# Patient Record
Sex: Female | Born: 1965
Health system: Southern US, Community
[De-identification: ages and names within clinical notes are randomized; demographics above are authoritative.]

## PROBLEM LIST (undated history)

## (undated) DIAGNOSIS — M21612 Bunion of left foot: Secondary | ICD-10-CM

## (undated) DIAGNOSIS — M722 Plantar fascial fibromatosis: Secondary | ICD-10-CM

## (undated) DIAGNOSIS — F329 Major depressive disorder, single episode, unspecified: Secondary | ICD-10-CM

## (undated) DIAGNOSIS — M5432 Sciatica, left side: Secondary | ICD-10-CM

## (undated) DIAGNOSIS — F4310 Post-traumatic stress disorder, unspecified: Secondary | ICD-10-CM

## (undated) DIAGNOSIS — E785 Hyperlipidemia, unspecified: Secondary | ICD-10-CM

## (undated) DIAGNOSIS — M21611 Bunion of right foot: Secondary | ICD-10-CM

## (undated) DIAGNOSIS — F32A Depression, unspecified: Secondary | ICD-10-CM

## (undated) DIAGNOSIS — R918 Other nonspecific abnormal finding of lung field: Secondary | ICD-10-CM

## (undated) HISTORY — PX: OTHER SURGICAL HISTORY: SHX169

---

## 2014-09-18 ENCOUNTER — Encounter (HOSPITAL_COMMUNITY): Payer: Self-pay | Admitting: Emergency Medicine

## 2014-09-18 ENCOUNTER — Emergency Department (HOSPITAL_COMMUNITY)
Admission: EM | Admit: 2014-09-18 | Discharge: 2014-09-18 | Disposition: A | Discharge status: Home / Self Care | Payer: Self-pay | Attending: Emergency Medicine | Admitting: Emergency Medicine

## 2014-09-18 DIAGNOSIS — L237 Allergic contact dermatitis due to plants, except food: Secondary | ICD-10-CM | POA: Insufficient documentation

## 2014-09-18 MED ORDER — PREDNISONE 20 MG PO TABS
60.0000 mg | ORAL_TABLET | Freq: Once | ORAL | Status: AC
  Administered 2014-09-18: 60 mg via ORAL
  Filled 2014-09-18: qty 3

## 2014-09-18 MED ORDER — PREDNISONE 20 MG PO TABS: 40.0000 mg | ORAL_TABLET | Freq: Every day | ORAL | Status: DC

## 2014-09-18 MED ORDER — DIPHENHYDRAMINE HCL 25 MG PO CAPS
25.0000 mg | ORAL_CAPSULE | Freq: Once | ORAL | Status: AC
  Administered 2014-09-18: 25 mg via ORAL
  Filled 2014-09-18: qty 1

## 2014-09-18 NOTE — Discharge Instructions (Signed)
Continue using benadryl to help with itching.  May also wish to use topical caladryl or calamine lotion to help try up vesicles. Return here as needed for new concerns.

## 2014-09-18 NOTE — ED Notes (Signed)
Pt. reports itchy skin rashes at lower legs , right hand and chin onset this week . Respirations unlabored / denies fever .

## 2014-09-18 NOTE — ED Provider Notes (Signed)
CSN: [id number]     Arrival date & time 09/18/14  0031 History   First MD Initiated Contact with Patient 09/18/14 0044     Chief Complaint  Patient presents with  . Rash     (Consider location/radiation/quality/duration/timing/severity/associated sxs/prior Treatment) Patient is a 49 y.o. female presenting with rash. The history is provided by the patient and medical records.  Rash   This is a 49 year old female with no significant past medical history presenting to the ED for further rash for her lower legs, right hand, and chin which began yesterday after mowing the grass.  She states rash is extremely pruritic.  Denies fever, chills.  No new soaps, detergents, or other personal care products.  Patient used OTC steroid cream without relief.  History reviewed. No pertinent past medical history. History reviewed. No pertinent past surgical history. No family history on file. History  Substance Use Topics  . Smoking status: Never Smoker   . Smokeless tobacco: Not on file  . Alcohol Use: No   OB History    No data available     Review of Systems  Skin: Positive for rash.  All other systems reviewed and are negative.     Allergies  Review of patient's allergies indicates no known allergies.  Home Medications   Prior to Admission medications   Not on File   BP 128/71 mmHg  Pulse 61  Temp(Src) 97.6 F (36.4 C) (Oral)  Resp 16  SpO2 98%  LMP 08/04/2014   Physical Exam  Constitutional: She is oriented to person, place, and time. She appears well-developed and well-nourished.  HENT:  Head: Normocephalic and atraumatic.  Mouth/Throat: Oropharynx is clear and moist.  No oral lesions; no oral swelling  Eyes: Conjunctivae and EOM are normal. Pupils are equal, round, and reactive to light.  Neck: Normal range of motion.  Cardiovascular: Normal rate, regular rhythm and normal heart sounds.   Pulmonary/Chest: Effort normal and breath sounds normal.  Abdominal: Soft. Bowel  sounds are normal.  Musculoskeletal: Normal range of motion.  Neurological: She is alert and oriented to person, place, and time.  Skin: Skin is warm and dry. Rash noted. Rash is vesicular.  Pruritic, vesicular rash of bilateral ankles occuring in chains; some areas of excoriation without signs of superimposed infection; no lesions on palms/soles  Psychiatric: She has a normal mood and affect.  Nursing note and vitals reviewed.   ED Course  Procedures (including critical care time) Labs Review Labs Reviewed - No data to display  Imaging Review No results found.   EKG Interpretation None      MDM   Final diagnoses:  Poison ivy   Rash consistent with poison ivy.  No signs of superimposed infection or cellulitis.  No oral lesions or oral swelling.  Patient afebrile and non-toxic in appearance.  Will start on steroid taper and benadryl, first dose given here.  Also recommended to use OTC caladryl and/or calamine lotion to help dry out vesicles.  Discussed plan with patient, he/she acknowledged understanding and agreed with plan of care.  Return precautions given for new or worsening symptoms.  Larene Pickett, PA-C 09/18/14 6256  Everlene Balls, MD 09/18/14 [phone]

## 2015-01-26 ENCOUNTER — Encounter (HOSPITAL_COMMUNITY): Payer: Self-pay | Admitting: Cardiology

## 2015-01-26 ENCOUNTER — Emergency Department (HOSPITAL_COMMUNITY)
Admission: EM | Admit: 2015-01-26 | Discharge: 2015-01-26 | Disposition: A | Discharge status: Home / Self Care | Payer: Self-pay | Attending: Emergency Medicine | Admitting: Emergency Medicine

## 2015-01-26 ENCOUNTER — Emergency Department (HOSPITAL_COMMUNITY): Payer: Self-pay

## 2015-01-26 DIAGNOSIS — M5441 Lumbago with sciatica, right side: Secondary | ICD-10-CM | POA: Insufficient documentation

## 2015-01-26 DIAGNOSIS — R51 Headache: Secondary | ICD-10-CM | POA: Insufficient documentation

## 2015-01-26 DIAGNOSIS — R2243 Localized swelling, mass and lump, lower limb, bilateral: Secondary | ICD-10-CM | POA: Insufficient documentation

## 2015-01-26 MED ORDER — NAPROXEN 500 MG PO TABS
500.0000 mg | ORAL_TABLET | Freq: Two times a day (BID) | ORAL | Status: DC
Start: 2015-01-26 — End: 2016-11-09

## 2015-01-26 MED ORDER — HYDROMORPHONE HCL 1 MG/ML IJ SOLN
1.0000 mg | Freq: Once | INTRAMUSCULAR | Status: AC
  Administered 2015-01-26: 1 mg via INTRAMUSCULAR
  Filled 2015-01-26: qty 1

## 2015-01-26 MED ORDER — PREDNISONE 50 MG PO TABS: ORAL_TABLET | ORAL | Status: DC

## 2015-01-26 MED ORDER — OXYCODONE-ACETAMINOPHEN 5-325 MG PO TABS: 1.0000 | ORAL_TABLET | Freq: Four times a day (QID) | ORAL | Status: DC | PRN

## 2015-01-26 NOTE — ED Notes (Signed)
Pt reports bilateral leg swelling and headache. Reports the pain in her legs radiates up into her hips.

## 2015-01-26 NOTE — Discharge Instructions (Signed)
Prescription for pain medicine, prednisone, anti-inflammatory medicine.  You will need to find a primary care doctor. Resource guide given.    Emergency Department Resource Guide 1) Find a Doctor and Pay Out of Pocket Although you won't have to find out who is covered by your insurance plan, it is a good idea to ask around and get recommendations. You will then need to call the office and see if the doctor you have chosen will accept you as a new patient and what types of options they offer for patients who are self-pay. Some doctors offer discounts or will set up payment plans for their patients who do not have insurance, but you will need to ask so you aren't surprised when you get to your appointment.  2) Contact Your Local Health Department Not all health departments have doctors that can see patients for sick visits, but many do, so it is worth a call to see if yours does. If you don't know where your local health department is, you can check in your phone book. The CDC also has a tool to help you locate your state's health department, and many state websites also have listings of all of their local health departments.  3) Find a [facility] If your illness is not likely to be very severe or complicated, you may want to try a walk in clinic. These are popping up all over the country in pharmacies, drugstores, and shopping centers. They're usually staffed by nurse practitioners or physician assistants that have been trained to treat common illnesses and complaints. They're usually fairly quick and inexpensive. However, if you have serious medical issues or chronic medical problems, these are probably not your best option.  No Primary Care Doctor: - Call Health Connect at  [phone] - they can help you locate a primary care doctor that  accepts your insurance, provides certain services, etc. - Physician Referral Service- [phone]  Chronic Pain Problems: Organization          Address  Phone   Notes  [facility]  [phone] Patients need to be referred by their primary care doctor.   Medication Assistance: Organization         Address  Phone   Notes  [facility] Med[facility] [address] [place], [place] [address] [phone] --Must be a resident of [facility] -- Must have NO insurance coverage whatsoever (no Medicaid/ Medicare, etc.) -- The pt. MUST have a primary care doctor that directs their care regularly and follows them in the community   MedAssist  [phone]   [employer]  [phone]    Agencies that provide inexpensive medical care: Organization         Address  Phone   Notes  [place]  [phone]   [facility]    [phone]   [facility] [place], [place] [address] [phone]   [place] [address], [place] [phone]   Planned Parenthood    [phone]   [facility]    [phone]   [place] [address], [place] Phone:  [phone], Fax:  [phone] Hours of Operation:  9 am - 6 pm, M-F.  Also accepts Medicaid/Medicare and self-pay.  [facility] for [address], [place] Phone: [phone], Fax: [phone]. Hours of Operation:  8:30 am - 5:30 pm, M-F.  Also accepts Medicaid and self-pay.  [facility] [place] [address], [place] Phone: [phone]   [place], [place], [place] [phone], Ext. [phone] Mondays & Thursdays: 7-9 AM.  First 15 patients are seen on a first come, first serve basis.    [place] Providers:  Organization         Address  Phone   Notes  [facility] [address], [place] [phone] Also accepts self-pay patients.  [facility] [address], [place]  [phone]   [place], [address], [place] [phone]   [facility] [address] [place] [phone]   Lucianne Lei [address], [place]   [phone] Only accepts [place] Access [place] patients after they have their name applied to their card.   Self-Pay (no insurance) in [facility]:  Organization         Address  Phone   Notes  Sickle Cell Patients, [facility] [place] [phone]   [facility] [place] [phone]   [facility] [place]  [place], [place], [place] [phone]   [facility]/Dr. Osei-Bonsu  [address] [place] or [address], [place] [phone] Phone number for both [place] and [place] locations is the same.  [facility] [address], [place] [phone]   [facility] [address] [place] or [address] [phone]   [facility] [address] [place] [phone], phone; [phone], fax Sees patients 1st and 3rd Saturday of every month.  Must not qualify for public or private insurance (i.e. Medicaid, Medicare, [place] Health Choice, Veterans' Benefits)  Household income should be no more than 200% of the poverty level The clinic cannot treat you if you are pregnant or think you are pregnant  Sexually transmitted diseases are not treated at the clinic.    Dental Care: Organization         Address  Phone  Notes  [facility] Department of [facility] [place] [phone] Accepts children up to age 37 who are enrolled in [place] or [place]; pregnant women with a Medicaid card; and  children who have applied for Medicaid or [place] Health Choice, but were declined, whose parents can pay a reduced fee at time of service.  [facility] Department of [facility]  [address], [place] [phone] Accepts children up to age 49 who are enrolled in [place] or [place]; pregnant women with a Medicaid card; and children who have applied for Medicaid or [place] Health Choice, but were declined, whose parents can pay a reduced fee at time of service.  [place] Adult [facility] Access PROGRAM  [place] [phone] Patients are seen by appointment only. Walk-ins are not accepted. [place] will see patients 12 years of age and older. Monday - Tuesday (8am-5pm) Most Wednesdays (8:30-5pm) $30 per visit, cash only  [place] Adult [employer] PROGRAM  [address], [employer] [phone])  [phone] Patients are seen by appointment only. Walk-ins are not accepted. [place] will see patients 47 years of age and older. One Wednesday Evening (Monthly: Volunteer Based).  $30 per visit, cash only  [place]  [phone] for adults; Children under age 54, call Graduate Pediatric Dentistry at [phone]. Children aged 55-14, please call [phone] to request a pediatric application.  Dental services are provided in all areas of dental care including fillings, crowns and bridges, complete and partial dentures, implants, gum treatment, root canals, and extractions. Preventive care is also provided. Treatment is provided to both adults and children. Patients are selected via a lottery and there is often a waiting list.   [facility] [address], [place]  [phone] [email or web address]   [facility] [address] [place], [place] [phone], Ext. [phone] Second and Fourth Thursday of each month, opens at 6:30 AM; Clinic ends at 9 AM.  Patients are seen on a first-come first-served  basis, and a limited number are seen during each clinic.   [facility]  [address] [place]ard Danker [place], [place] [phone]   Eligibility Requirements You must have lived in [place], [place], or [place] counties for at least the last three months.   You cannot be eligible for state or federal sponsored [employer], including [employer], [place], or [employer].   You generally cannot be eligible for healthcare insurance through your employer.    How to apply: Eligibility screenings are held every Tuesday and Wednesday afternoon from 1:00 pm until 4:00 pm. You do not need an appointment for the interview!  [facility] [address] [place], [place]   [place]  [place] Department  [place]  [phone]    Behavioral Health Resources in the Community: Intensive Outpatient Programs Organization         Address  Phone  Notes  Loraine Jefferson. [address], [place], [place] [phone]   [facility] Outpatient [address] [place], [place]   ADS: Alcohol & Drug Svcs [address] [place] [employer] [place], [place]   [place] [address]  [place], [place] or [phone]   Substance Abuse Resources Organization         Address  Phone  Notes  Alcohol and Drug Services  [phone]   [place]  [phone]   [facility] [place]   Chinita Pester  [phone]   Residential & Outpatient Substance Abuse Program  [phone]   Psychological Services Organization         Address  Phone  Notes  [facility] [place]  [place]  [phone]   [place] [address], [place] or [phone]    Mobile Crisis Teams Organization          Address  Phone  Notes  [facility]atives, Mobile Crisis Care Unit  [phone]   [facility]otherapeutic [facility]  [address] [place], [place]   [place] Levels [address] [place] [phone]    Self-Help/Support Groups Organization         Address  Phone             Notes  [place]. of [place] - variety of support groups  [phone]  [phone] Call for more information  Narcotics Anonymous (NA), [facility] [address], [employer] [place]  2 meetings at this location   Residential Facilities manager         Address  Phone  Notes  [facility]idential [facility] [place],    [place]  [phone]   [facility]  [address], [place] [phone], [place], [place]   [place] , [place] [phone] Admissions: 8am-3pm M-F  Incentives Substance [place] [address],    [place], [place] [phone]   [facility] [address] [place], [place], [place]   [facility] [address],  [place], [place]   [facility] Programs - Intensive Outpatient [address] Kristeen Mans [address], [place], [place]   [facility] [place].,  [place], [place] [phone] or [phone]   [facility] Services (RTS) [address], [place], [place] Accepts Medicaid  [facility] [place] [address]  [place] [phone] Substance Abuse/Addiction Treatment   [facility] Organization         Address  Phone  Notes  [facility]  [phone]   Domenic Schwab, PhD [address] Arlis Port[place], [place]   [phone] or [phone]   [facility] [place] JAARS [place], [place] [phone]   [facility] [address] [place], [place] [phone] Insurance/Medicaid/sponsorship  through [facility] [address], [address] [place]                                    [place], [place] [phone] [address], [place] [phone]    Dr. Adele Schilder  [phone]   [facility] Dept. 1) [address] [place] 2) [place] 3)  [place] [address], [place] [phone]  [phone]   [place] [phone] or [phone] (After Hours)

## 2015-01-26 NOTE — ED Provider Notes (Signed)
CSN: [id number]     Arrival date & time 01/26/15  1551 History   First MD Initiated Contact with Patient 01/26/15 1721     Chief Complaint  Patient presents with  . Leg Swelling  . Back Pain  . Headache     (Consider location/radiation/quality/duration/timing/severity/associated sxs/prior Treatment) HPI.....language line used for interpretation. Patient complains of low back pain with radiation to both legs, right greater than left. Symptoms have been apparent for some time. No trauma. No bowel or bladder incontinence. Severity of pain is mild to moderate. She is taking unknown medications at home.  History reviewed. No pertinent past medical history. History reviewed. No pertinent past surgical history. History reviewed. No pertinent family history. Social History  Substance Use Topics  . Smoking status: Never Smoker   . Smokeless tobacco: None  . Alcohol Use: No   OB History    No data available     Review of Systems  All other systems reviewed and are negative.     Allergies  Review of patient's allergies indicates no known allergies.  Home Medications   Prior to Admission medications   Medication Sig Start Date End Date Taking? Authorizing Provider  naproxen (NAPROSYN) 500 MG tablet Take 1 tablet (500 mg total) by mouth 2 (two) times daily with a meal. As needed for pain 01/26/15   Nat Christen, MD  oxyCODONE-acetaminophen (PERCOCET/ROXICET) 5-325 MG per tablet Take 1-2 tablets by mouth every 6 (six) hours as needed. 01/26/15   Nat Christen, MD  predniSONE (DELTASONE) 50 MG tablet One tab for 6 days, one half tab for 6 days 01/26/15   Nat Christen, MD   BP 105/72 mmHg  Pulse 68  Temp(Src) 98.3 F (36.8 C) (Oral)  Resp 14  Ht 5\' 4"  (1.626 m)  Wt 184 lb 9.6 oz (83.734 kg)  BMI 31.67 kg/m2  SpO2 96%  LMP 01/05/2015 Physical Exam  Constitutional: She is oriented to person, place, and time. She appears well-developed and well-nourished.  Patient is ambulatory without  deficits.  HENT:  Head: Normocephalic and atraumatic.  Eyes: Conjunctivae and EOM are normal. Pupils are equal, round, and reactive to light.  Neck: Normal range of motion. Neck supple.  Cardiovascular: Normal rate and regular rhythm.   Pulmonary/Chest: Effort normal and breath sounds normal.  Abdominal: Soft. Bowel sounds are normal.  Musculoskeletal: Normal range of motion.  Minimal lower back tenderness to palpation. Minimal pain with straight leg raise right greater than left.  Neurological: She is alert and oriented to person, place, and time.  Skin: Skin is warm and dry.  Psychiatric: She has a normal mood and affect. Her behavior is normal.  Nursing note and vitals reviewed.   ED Course  Procedures (including critical care time) Labs Review Labs Reviewed - No data to display  Imaging Review Dg Lumbar Spine Complete  01/26/2015   CLINICAL DATA:  Initial encounter for three-week history of sharp low back pain radiating into both legs.  EXAM: LUMBAR SPINE - COMPLETE 4+ VIEW  COMPARISON:  None.  FINDINGS: There is no evidence of lumbar spine fracture. Alignment is normal. Intervertebral disc spaces are maintained.  IMPRESSION: Negative.   Electronically Signed   By: Misty Stanley M.D.   On: 01/26/2015 17:54   I have personally reviewed and evaluated these images and lab results as part of my medical decision-making.   EKG Interpretation None      MDM   Final diagnoses:  Midline low back pain with right-sided sciatica  Patient has no primary care relationship. Plain films of lumbar spine were negative. Pain appears to be sciatica in nature. She could've a herniated disc. Discharge medications Percocet, prednisone, Naprosyn 500 mg. Resource guide given for follow-up.  Nat Christen, MD 01/26/15 [phone]

## 2015-08-14 ENCOUNTER — Encounter (HOSPITAL_COMMUNITY): Payer: Self-pay | Admitting: Emergency Medicine

## 2015-08-14 ENCOUNTER — Emergency Department (HOSPITAL_COMMUNITY)
Admission: EM | Admit: 2015-08-14 | Discharge: 2015-08-14 | Disposition: A | Discharge status: Home / Self Care | Payer: Self-pay | Attending: Emergency Medicine | Admitting: Emergency Medicine

## 2015-08-14 DIAGNOSIS — M542 Cervicalgia: Secondary | ICD-10-CM | POA: Insufficient documentation

## 2015-08-14 MED ORDER — PREDNISONE 20 MG PO TABS: 20.0000 mg | ORAL_TABLET | Freq: Two times a day (BID) | ORAL | Status: DC

## 2015-08-14 MED ORDER — DIAZEPAM 5 MG PO TABS: 5.0000 mg | ORAL_TABLET | Freq: Four times a day (QID) | ORAL | Status: DC | PRN

## 2015-08-14 NOTE — Discharge Instructions (Signed)
Use heat on the sore area 3 or 4 times a day. Do not work when you're taking the diazepam.   Company secretary (Musculoskeletal Pain) El dolor musculoesqueltico se siente en huesos y msculos. El dolor puede ocurrir en cualquier parte del cuerpo. El profesional que lo asiste podr tratarlo sin Pharmacist, community causa del dolor. Lo tratar [employer] de laboratorio (sangre y [place]), las radiografas y [place]. La causa de estos dolores puede ser un virus.  CAUSAS Generalmente no existe una causa definida para este trastorno. Tambin el [employer] [place] [employer] puede deberse a la [place]. En la actividad excesiva se incluye el hacer ejercicios fsicos muy intensos cuando no se est en buena forma. El dolor de huesos tambin puede deberse a cambios climticos. Los huesos son sensibles a los cambios en la presin atmosfrica. [place]  Para proteger su privacidad, no se entregarn los [employer] pruebas por telfono. Asegrese de conseguirlos. Consulte el modo en que podr obtenerlos si no se lo han informado. Es su responsabilidad contar con los [employer].  Utilice los medicamentos de venta libre o de prescripcin para Conservation officer, historic buildings, Health and safety inspector o la [place], segn se lo indique el profesional que lo asiste. Si le han administrado medicamentos, no conduzca, no opere maquinarias ni Teacher, adult education, y tampoco firme documentos legales durante 24 horas. No beba alcohol. No tome pldoras para dormir ni otros medicamentos que Animal nutritionist.  Podr seguir con todas las actividades a menos que stas le ocasionen ms [employer]. Cuando el dolor disminuya, es importante que gradualmente reanude toda la rutina habitual. Retome las actividades comenzando lentamente. Aumente gradualmente la intensidad y la duracin de sus actividades o del ejercicio.  Durante los perodos de dolor intenso, el reposo en cama puede  ser beneficioso. Recustese o sintese en la posicin que le sea ms cmoda.  Coloque hielo sobre la zona afectada.  Ponga hielo en [place]ico[place] Ba.  Colquese una toalla entre la piel y la bolsa de hielo.  Aplique el hielo durante 10 a 20 minutos 3  4 veces por da.  Si el dolor empeora, o no desaparece puede ser [employer] repetir las pruebas o Optometrist nuevos exmenes. El profesional que lo asiste podr requerir investigar ms profundamente para Animator causa posible. SOLICITE ATENCIN MDICA DE INMEDIATO SI:  Siente que el dolor empeora y no se alivia con los medicamentos.  Siente dolor en el pecho asociado a falta de aire, sudoracin, nuseas o vmitos.  El dolor se localiza en el abdomen.  Comienza a sentir nuevos sntomas que parecen ser diferentes o que lo preocupan. ASEGRESE DE QUE:   Comprende las instrucciones para el alta mdica.  Controlar su enfermedad.  Solicitar atencin mdica de inmediato segn las indicaciones.   Esta informacin no tiene Marine scientist el consejo del mdico. Asegrese de hacerle al mdico cualquier pregunta que tenga.   Document Released: 01/26/2005 Document Revised: 07/11/2011 Elsevier Interactive Patient Education [employer].

## 2015-08-14 NOTE — ED Provider Notes (Signed)
CSN: [id number]     Arrival date & time 08/14/15  1915 History   First MD Initiated Contact with Patient 08/14/15 2151     Chief Complaint  Patient presents with  . Headache  . Neck Pain     (Consider location/radiation/quality/duration/timing/severity/associated sxs/prior Treatment) HPI   Lori Morse is a 50 y.o. female who presents for evaluation of left neck pain, for 5 days, without known trauma. 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 She has exacerbation of the pain with movement of the neck, rotating right and left lateral bending. She denies weakness, dizziness, paresthesias, nausea, vomiting or chest pain. There are no other known modifying factors.   History reviewed. No pertinent past medical history. History reviewed. No pertinent past surgical history. No family history on file. Social History  Substance Use Topics  . Smoking status: Never Smoker   . Smokeless tobacco: None  . Alcohol Use: No   OB History    No data available     Review of Systems  All other systems reviewed and are negative.     Allergies  Review of patient's allergies indicates no known allergies.  Home Medications   Prior to Admission medications   Medication Sig Start Date End Date Taking? Authorizing Provider  diazepam (VALIUM) 5 MG tablet Take 1 tablet (5 mg total) by mouth every 6 (six) hours as needed for anxiety (spasms). 08/14/15   Daleen Bo, MD  naproxen (NAPROSYN) 500 MG tablet Take 1 tablet (500 mg total) by mouth 2 (two) times daily with a meal. As needed for pain 01/26/15   Nat Christen, MD  oxyCODONE-acetaminophen (PERCOCET/ROXICET) 5-325 MG per tablet Take 1-2 tablets by mouth every 6 (six) hours as needed. 01/26/15   Nat Christen, MD  predniSONE (DELTASONE) 20 MG tablet Take 1 tablet (20 mg total) by mouth 2 (two) times daily. 08/14/15   Daleen Bo, MD   BP 120/71 mmHg  Pulse 58  Temp(Src) 98.6 F (37 C) (Oral)  Resp 16  Ht 5\' 2"   (1.575 m)  Wt 192 lb (87.091 kg)  BMI 35.11 kg/m2  SpO2 100%  LMP 07/23/2015 Physical Exam  Constitutional: She is oriented to person, place, and time. She appears well-developed and well-nourished.  HENT:  Head: Normocephalic and atraumatic.  Right Ear: External ear normal.  Left Ear: External ear normal.  Eyes: Conjunctivae and EOM are normal. Pupils are equal, round, and reactive to light.  Neck: Normal range of motion and phonation normal. Neck supple.  Cardiovascular: Normal rate, regular rhythm and normal heart sounds.   Pulmonary/Chest: Effort normal and breath sounds normal. She exhibits no bony tenderness.  Musculoskeletal: Normal range of motion.  Tenderness left lateral paravertebral musculature and sternocleidomastoid, to palpation. Mild limitation of range of motion of the left neck musculature.  Neurological: She is alert and oriented to person, place, and time. No cranial nerve deficit or sensory deficit. She exhibits normal muscle tone. Coordination normal.  Skin: Skin is warm, dry and intact.  Psychiatric: She has a normal mood and affect. Her behavior is normal. Judgment and thought content normal.  Nursing note and vitals reviewed.   ED Course  Procedures (including critical care time)   Findings discussed with patient, all questions answered.   Labs Review Labs Reviewed - No data to display  Imaging Review No results found. I have personally reviewed and evaluated these images and lab results as part of my medical decision-making.   EKG  Interpretation None      MDM   Final diagnoses:  Neck pain on left side    Nonspecific left neck pain, most likely musculoskeletal in origin. Doubt spinal myelopathy, serious bacterial infection or fracture.  Nursing Notes Reviewed/ Care Coordinated Applicable Imaging Reviewed Interpretation of Laboratory Data incorporated into ED treatment  The patient appears reasonably screened and/or stabilized for  discharge and I doubt any other medical condition or other [facility] requiring further screening, evaluation, or treatment in the ED at this time prior to discharge.  Plan: Home Medications- Prednisone, Valium; Home Treatments- heat, no work for 2 weeks; return here if the recommended treatment, does not improve the symptoms; Recommended follow up- PCP prn     Daleen Bo, MD 08/14/15 2318

## 2015-08-14 NOTE — ED Notes (Signed)
Pt. reports left neck pain radiating to left shoulder with headache onset Monday this week , denies injury , no fever or chills.

## 2015-08-14 NOTE — ED Notes (Signed)
Pt stable, ambulatory, states understanding of discharge instructions 

## 2016-10-27 ENCOUNTER — Encounter: Payer: Self-pay | Admitting: Pediatric Intensive Care

## 2016-11-04 NOTE — Congregational Nurse Program (Signed)
Congregational Nurse Program Note  Date of Encounter: 10/27/2016  Past Medical History: No past medical history on file.  Encounter Details:     CNP Questionnaire - 10/27/16 1500      Patient Demographics   Is this a new or existing patient? New   Patient is considered a/an Immigrant   Race Latino/Hispanic     Patient Assistance   Location of Patient Assistance [place]   Patient's financial/insurance status Self-Pay (Uninsured)   Uninsured Patient ([place] Card/Care Connects) Yes   Interventions Assisted patient in making appt.   Patient referred to apply for the following financial assistance Not Applicable   Food insecurities addressed Not Applicable   Transportation assistance No   Assistance securing medications No   Educational health offerings Navigating the healthcare system;Other     Encounter Details   Primary purpose of visit Education/Health Concerns   Was an Emergency Department visit averted? Not Applicable   Does patient have a medical provider? No   Patient referred to Establish PCP   Was a mental health screening completed? (GAINS tool) No   Does patient have dental issues? No   Does patient have vision issues? No   Does your patient have an abnormal blood pressure today? No   Since previous encounter, have you referred patient for abnormal blood pressure that resulted in a new diagnosis or medication change? No   Does your patient have an abnormal blood glucose today? No   Since previous encounter, have you referred patient for abnormal blood glucose that resulted in a new diagnosis or medication change? No   Was there a life-saving intervention made? No     Via interpreter [place]- client reports history of foot pain- specifically on arches. Foot pain is worse in morning. Client is on her feet frequently for work and for extended periods of time. CN demonstrated exercises for stretching feet and suggests rolling feet on frozen water bottled or tennis ball  for pain relief. Client would also liek PCP appointment for general checkup. CN assisted appointment at [place].

## 2016-11-09 ENCOUNTER — Encounter (INDEPENDENT_AMBULATORY_CARE_PROVIDER_SITE_OTHER): Payer: Self-pay | Admitting: Physician Assistant

## 2016-11-09 ENCOUNTER — Ambulatory Visit (INDEPENDENT_AMBULATORY_CARE_PROVIDER_SITE_OTHER): Payer: Self-pay | Admitting: Physician Assistant

## 2016-11-09 VITALS — BP 144/74 | HR 63 | Temp 98.0°F | Ht 64.0 in | Wt 189.4 lb

## 2016-11-09 DIAGNOSIS — Z23 Encounter for immunization: Secondary | ICD-10-CM

## 2016-11-09 DIAGNOSIS — M722 Plantar fascial fibromatosis: Secondary | ICD-10-CM

## 2016-11-09 MED ORDER — PLANTAR FASCIITIS ARCH SLEEVE MISC: 1.0000 "application " | Freq: Every day | 0 refills | Status: DC

## 2016-11-09 MED ORDER — NAPROXEN 500 MG PO TABS: 500.0000 mg | ORAL_TABLET | Freq: Two times a day (BID) | ORAL | 0 refills | Status: DC

## 2016-11-09 NOTE — Progress Notes (Signed)
  Subjective:  Patient ID: Lori Morse, female    DOB: 1965/06/30  Age: 51 y.o. MRN: [id number]  CC: right heel pain.  HPI [place] is a 51 y.o. female with no significant PMH presents with right heel pain. Onset of right heel pain 8 months ago. 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 Has taken Advil as recommended by a provider at [place]. Advil is mildly effective and only worked temporarily. Worse with walking on hard surfaces and when first standing from a supine or seated position. Feels like a "ball" in the heel. There is mild associated swelling in the right heel. Does not endorse any other symptoms or complaints.      ROS Review of Systems  Constitutional: Negative for chills, fever and malaise/fatigue.  Eyes: Negative for blurred vision.  Respiratory: Negative for shortness of breath.   Cardiovascular: Negative for chest pain and palpitations.  Gastrointestinal: Negative for abdominal pain and nausea.  Genitourinary: Negative for dysuria and hematuria.  Musculoskeletal: Negative for joint pain and myalgias.       Right heel pain  Skin: Negative for rash.  Neurological: Negative for tingling and headaches.  Psychiatric/Behavioral: Negative for depression. The patient is not nervous/anxious.     Objective:  There were no vitals taken for this visit.  BP/Weight 08/14/2015 01/26/2015 8/89/1694  Systolic BP 503 888 280  Diastolic BP 71 63 71  Wt. (Lbs) 192 184.6 -  BMI 35.11 31.67 -      Physical Exam  Constitutional: She is oriented to person, place, and time.  Well developed, overweight, NAD, polite  HENT:  Head: Normocephalic and atraumatic.  Eyes: No scleral icterus.  Cardiovascular: Normal rate, regular rhythm and normal heart sounds.   Pulmonary/Chest: Effort normal and breath sounds normal.  Musculoskeletal:  Right medial and central band of plantar fascia with mild TTP. Mild edema of the medial band noted.  Neurological: She is  alert and oriented to person, place, and time. No cranial nerve deficit. Coordination normal.  Normal gait  Skin: Skin is warm and dry. No rash noted. No erythema. No pallor.  Psychiatric: She has a normal mood and affect. Her behavior is normal. Thought content normal.  Vitals reviewed.    Assessment & Plan:   1. Plantar fasciitis of right foot - Begin Naproxen 500 mg BID x15 days - Advised to use Plantar Fasciitis foot sleep support, plantar fasciitis insoles, stretches, rolling massage of the sole, and to abstain from stair climber exercises until pain is gone.  2. Need for Tdap vaccination - administered Tdap in clinic today     Follow-up: Return in about 2 weeks (around 11/23/2016) for full physical.   Clent Demark PA

## 2016-11-09 NOTE — Patient Instructions (Signed)
Compre lo siguiente:  1. Plantar Fasciitis insoles 2. Plantar Fasciitis Foot Sleep support 3. Naproxen 500 mg  No se olvide de acer massages y Facilities manager.   Fascitis plantar (Plantar Fasciitis) La fascitis plantar es una afeccin dolorosa que se produce en el taln. Ocurre cuando la banda de tejido que [employer] dedos con el hueso del taln (fascia plantar) se irrita. Esto puede ocurrir despus de Financial planner ejercicio u otras actividades repetitivas (lesin por uso excesivo). El dolor de la fascitis plantar puede ser de leve (irritacin) a intenso, y en los casos ms agudos puede dificultar que la persona camine o se [place]. Por lo general, el dolor es peor a la maana o despus de Public affairs consultant sentado o acostado durante un perodo. CAUSAS Este trastorno puede ser causado por:  Estar de pie durante largos perodos.  Usar zapatos que no calcen bien.  Practicar actividades de alto impacto, como correr, o hacer ejercicios [employer] o ballet.  Tener sobrepeso.  Tener una forma de caminar (marcha) anormal.  Tener los msculos de la pantorrilla tensos.  Tener el arco alto en los pies.  Comenzar una nueva actividad fsica. SNTOMAS El sntoma principal de esta afeccin es el dolor en el taln. Otros sntomas pueden ser los siguientes:  Dolor que empeora despus de una actividad o un ejercicio.  Dolor ms intenso a la maana o despus de Production assistant, radio.  Dolor que desaparece despus de caminar durante unos minutos. DIAGNSTICO Esta afeccin se puede diagnosticar en funcin de los signos y los sntomas. El mdico Environmental education officer un examen fsico para controlar si tiene lo siguiente:  Un rea dolorida en la parte inferior del pie.  El arco alto.  Dolor al Agricultural consultant.  Dificultad para mover el pie. Tambin puede necesitar estudios por imgenes para confirmar el diagnstico. Estos pueden incluir los siguientes:  Radiografas.  Ecografa.  Resonancia  magntica. TRATAMIENTO El tratamiento de la fascitis plantar depende de la gravedad de la afeccin. El tratamiento puede incluir lo siguiente:  Reposo, hielo y analgsicos de venta libre para Financial controller.  Ejercicios para [place] pantorrillas y la fascia plantar.  Una frula que [employer] estirado y [place] mientras usted duerme (frula nocturna).  Fisioterapia para [employer] sntomas y Customer service manager en el futuro.  Inyecciones de cortisona para Best boy intenso.  Tratamiento con ondas de choque extracorpreas para estimular con impulsos elctricos la fascia plantar lesionada. Esto suele usarse como un ltimo recurso antes de la [place].  Ciruga, si los otros tratamientos no han funcionado despus de 93meses. [place] los medicamentos solamente como se lo haya indicado el mdico.  Evite las actividades que causan dolor.  Frote la parte inferior del pie sobre una bolsa de hielo o una botella de agua fra. Haga esto durante 64minutos, de 3a 4veces al [employer].  Realice estiramientos simples como se lo haya indicado el mdico.  Trate de usar calzado deportivo con amortiguacin de aire o gel, o plantillas blandas.  Si el mdico se lo ha indicado, use una frula nocturna para dormir.  Cumpla con todas las visitas de control, segn le indique su mdico. PREVENCIN  No realice ejercicios ni actividades que le causen dolor en el taln.  Considere la posibilidad de Art gallery manager actividades de bajo impacto si sigue teniendo problemas.  Pierda peso si lo necesita. La mejor forma de prevenir la fascitis plantar es evitar las actividades que lesionan ms  la fascia plantar. SOLICITE ATENCIN MDICA SI:  Los sntomas no desaparecen despus del tratamiento en su casa.  El dolor [place].  El Gaffer la capacidad de moverse o de Optometrist las actividades diarias. Esta informacin no tiene Marine scientist el  consejo del mdico. Asegrese de hacerle al mdico cualquier pregunta que tenga. Document Released: 01/26/2005 Document Revised: 08/10/2015 Document Reviewed: 02/26/2014 Elsevier Interactive Patient Education  2018 [employer].

## 2016-11-23 ENCOUNTER — Telehealth (INDEPENDENT_AMBULATORY_CARE_PROVIDER_SITE_OTHER): Payer: Self-pay | Admitting: Physician Assistant

## 2016-11-23 NOTE — Telephone Encounter (Signed)
Pt called stating both feet are swollen and not feeling better with exercises. Pt has been scheduled a f/up but would like to know if there is a different medication she can try. Please f/up

## 2016-11-24 NOTE — Telephone Encounter (Signed)
I have advised that she get plantar fasciitis insoles and sleep support. She should also be using these. In regards to medications, she should continue naproxen 500 mg BID for now until f/u.

## 2016-11-24 NOTE — Telephone Encounter (Signed)
FWD to PCP. Tempestt S Roberts, CMA  

## 2016-12-13 ENCOUNTER — Ambulatory Visit (INDEPENDENT_AMBULATORY_CARE_PROVIDER_SITE_OTHER): Payer: Self-pay | Admitting: Physician Assistant

## 2016-12-13 ENCOUNTER — Encounter (INDEPENDENT_AMBULATORY_CARE_PROVIDER_SITE_OTHER): Payer: Self-pay | Admitting: Physician Assistant

## 2016-12-13 VITALS — BP 110/66 | HR 58 | Temp 98.5°F | Resp 16 | Ht 60.0 in | Wt 189.2 lb

## 2016-12-13 DIAGNOSIS — M722 Plantar fascial fibromatosis: Secondary | ICD-10-CM

## 2016-12-13 DIAGNOSIS — M79671 Pain in right foot: Secondary | ICD-10-CM

## 2016-12-13 DIAGNOSIS — L723 Sebaceous cyst: Secondary | ICD-10-CM

## 2016-12-13 MED ORDER — IBUPROFEN 800 MG PO TABS: 800.0000 mg | ORAL_TABLET | Freq: Three times a day (TID) | ORAL | 0 refills | Status: DC | PRN

## 2016-12-13 MED ORDER — TRAMADOL HCL 50 MG PO TABS: 50.0000 mg | ORAL_TABLET | Freq: Four times a day (QID) | ORAL | 0 refills | Status: AC | PRN

## 2016-12-13 NOTE — Patient Instructions (Signed)
Fasciotoma plantar abierta (Open Plantar Fasciotomy) La fasciotoma plantar abierta es un procedimiento para cortar una franja de tejido grueso de la parte inferior del pie (fascia plantar) para aliviar la presin. La fascia plantar conecta el hueso del taln con la base de los dedos del pie. Si la fascia se hincha o se irrita (fascitis plantar), puede causar dolor en el taln. Puede que necesite una ciruga si tiene dolor en el taln por fascitis plantar y si otros tratamientos no han sido efectivos. INFORME A SU MDICO:  Cualquier alergia que tenga.  Todos los [employer], incluidos vitaminas, hierbas, gotas oftlmicas, cremas y medicamentos de venta libre.  Problemas previos que usted o los [employer] de su familia hayan tenido con el uso de anestsicos.  Enfermedades de la sangre que tenga.  Si tiene cirugas previas.  Cualquier enfermedad que tenga. RIESGOS Y COMPLICACIONES En general, se trata de un procedimiento seguro. Sin embargo, pueden ocurrir complicaciones, por ejemplo:  Hemorragia.  Infeccin.  Inestabilidad del pie.  Lesiones nerviosas.  Cicatrices dolorosas en el arco del pie.  Se le forma un cogulo sanguneo en una vena de la pierna (trombosis venosa profunda). A veces un cogulo se puede desprender y desplazarse hasta el pulmn (embolia pulmonar).  El procedimiento no tiene xito.  Reaccin alrgica a un medicamento. ANTES DEL PROCEDIMIENTO  Consulte al mdico si debe hacer o no lo siguiente: ? Cambiar o suspender los medicamentos que toma habitualmente. Esto es muy importante si toma medicamentos para la diabetes o anticoagulantes. ? Tomar medicamentos como aspirina e ibuprofeno. Estos medicamentos pueden tener un efecto anticoagulante en la [place]. No tome estos medicamentos antes del procedimiento si el mdico le indica que no lo haga.  Siga las indicaciones del mdico respecto de las restricciones para las comidas o las bebidas.  Haga  planes para que una persona lo lleve de vuelta a su casa despus del procedimiento.  Pregntele al mdico cmo se Scientist, clinical (histocompatibility and immunogenetics) o se Museum/gallery curator de la Leisure centre manager.  Pueden darle antibiticos para ayudar a prevenir las infecciones. PROCEDIMIENTO  Para reducir el riesgo de infecciones: ? El equipo mdico se lavar o se desinfectar las manos. ? Le lavarn la piel con jabn.  Le colocarn una va intravenosa (IV) en una de las venas.  Le administrarn uno o ms de los siguientes medicamentos: ? Un medicamento para ayudarlo a relajarse (sedante). ? Un medicamento para adormecer la zona (anestesia local). ? Un medicamento que lo har dormir (anestesia general).  El cirujano har un corte pequeo (incisin) en la planta del pie. Esta incisin se realiza generalmente en la zona interior de la planta del pie, justo en frente del taln.  El cirujano extender la incisin hacia abajo, a la fascia. Luego cortar aproximadamente la mitad de las capas de la fascia, la mayora de la parte interior.  La incisin se cerrar con puntos (suturas).  Colocarn una vendas (vendaje) sobre la parte inferior del pie. Este procedimiento puede variar segn el mdico y el hospital. DESPUS DEL PROCEDIMIENTO  Marin Comment controlarn con frecuencia la presin arterial, la frecuencia cardaca, la frecuencia respiratoria y Retail buyer de oxgeno en la sangre hasta que haya desaparecido el efecto de los medicamentos administrados.  Le darn medicamentos para el dolor si los necesita.  El mdico lo enviar a su casa con una frula, bota o zapato ortopdicos para usar [employer] se cure. Tambin podr indicarle el uso de muletas para ayudarle a caminar sin usar el pie afectado y  soportar el peso del cuerpo.  No conduzca durante 24horas si le administraron un sedante. Esta informacin no tiene Marine scientist el consejo del mdico. Asegrese de hacerle al mdico cualquier pregunta que tenga. Document Released:  03/30/2015 Document Revised: 03/30/2015 Document Reviewed: 10/27/2014 Elsevier Interactive Patient Education  2018 [place] (Epidermal Cyst) Un quiste epidrmico a veces se denomina quiste de inclusin epidrmica o quiste infundibular. Es un saco de tejido cutneo. El saco contiene una sustancia llamada [place]. La queratina es una protena que normalmente se secreta a travs de folculos capilares. Cuando la queratina queda atrapada en la capa superior de la piel (epidermis), puede formar un quiste epidrmico. Los quistes epidrmicos normalmente se encuentran en la cara, el cuello, el tronco o los genitales. Estos quistes por lo general no son dainos (son benignos), y es posible que no causen sntomas, salvo que se infecten. Es importante que no apriete los quistes epidrmicos. CAUSAS Esta afeccin puede ser causada por lo siguiente:  Un folculo capilar bloqueado.  Un pelo que se riza y vuelve a Dietitian en la piel en vez de crecer hacia afuera de la piel (pelo encarnado).  Un poro bloqueado.  Irritacin de la piel.  Una lesin en la piel.  Ciertas afecciones que se transmiten de padres a hijos (hereditarias).  Virus del papiloma humano (VPH). FACTORES DE RIESGO Los siguientes factores pueden hacer que usted sea ms propenso a desarrollar un quiste epidrmico:  Acn.  Tener sobrepeso.  Usar ropa [place]. SNTOMAS El nico sntoma de esta afeccin puede ser un pequeo bulto no doloroso debajo de la piel. Cuando un quiste epidrmico se infecta, pueden aparecer algunos sntomas, como los siguientes:  Enrojecimiento.  Inflamacin.  Dolor a Secretary/administrator.  [place].  Cristy Hilts.  Queratina que sale del [place]. La queratina puede verse como una sustancia blanca griscea con mal olor.  Pus que sale del quiste. DIAGNSTICO Esta afeccin se diagnostica mediante un examen fsico. En algunos casos, pueden tomarle una muestra de tejido (biopsia) del quiste para  examinarla con un microscopio o hacerle anlisis para ver si hay bacterias. Pueden derivarlo a un mdico especialista en el cuidado de la piel (dermatlogo). TRATAMIENTO En muchos casos, los quistes epidrmicos desaparecen por s solos, sin tratamiento. Si un quiste se infecta, el tratamiento puede incluir lo siguiente:  Harlon Ditty y Training and development officer. Despus del drenaje, se puede hacer una ciruga menor para [employer] restos del [place].  Antibiticos como ayuda para prevenir una infeccin.  Inyecciones de medicamentos (corticoides) que ayudan a Risk manager.  Ciruga para extirpar el quiste. Se puede realizar una ciruga si: ? El quiste se agranda. ? El [employer]. ? Existe la posibilidad de que el quiste se [place] en un tipo de cncer. [place] los medicamentos de venta libre y los recetados solamente como se lo haya indicado el mdico.  Si le recetaron un antibitico, tmelo como se lo haya indicado el mdico. No deje de usar el antibitico aunque comience a Sports administrator.  Mantenga el rea que rodea el quiste limpia y [place].  Use ropa suelta y seca.  No intente apretar el quiste.  Evite tocar el quiste.  Revise el [employer] para detectar signos de infeccin.  Concurra a todas las visitas de control como se lo haya indicado el mdico. Esto es importante. PREVENCIN  Use ropa limpia y [place].  Evite usar ropa [place].  Mantenga la piel limpia  y [place]. Tome una ducha o un bao [employer].  Lvese el cuerpo con perxido de benzolo cuando tome una ducha o un bao. SOLICITE ATENCIN MDICA SI:  El quiste desarrolla sntomas de infeccin.  El problema no mejora, o empeora.  Le aparece un quiste con un aspecto diferente de otros quistes que ha tenido.  Tiene fiebre. SOLICITE ATENCIN MDICA DE INMEDIATO SI:  Aparece enrojecimiento en el quiste que se propaga hacia el rea que lo rodea. Esta  informacin no tiene Marine scientist el consejo del mdico. Asegrese de hacerle al mdico cualquier pregunta que tenga. Document Released: 05/30/2006 Document Revised: 07/11/2011 Document Reviewed: 02/18/2015 Elsevier Interactive Patient Education  [employer].

## 2016-12-13 NOTE — Progress Notes (Signed)
Subjective:  Patient ID: Lori Morse, female    DOB: 1965/07/18  Age: 51 y.o. MRN: [id number]  CC: plantar fasciitis  HPI Lori Morse is a 51 y.o. female with no significant PMH presents with right heel pain. Onset of right heel pain 9 months ago. 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 Has taken Advil as recommended by a provider at [facility]. Advil is mildly effective and only worked temporarily. Worse with walking on hard surfaces and when first standing from a supine or seated position. Feels like a "ball" in the heel. There is mild associated swelling in the right heel. She has been out of work for one month due to heel pain. Naproxen, massage, stretches, and plantar fasciitis insole has failed.      Pt also complains of a small lesion over the left upper lip that has progressively grown over the past few weeks. She tried squeezing the lesion but only blood would come out. Somewhat tender to palpation. No surrounding tenderness, erythema or edema.     Outpatient Medications Prior to Visit  Medication Sig Dispense Refill  . Elastic Bandages & Supports (PLANTAR FASCIITIS ARCH SLEEVE) MISC 1 application by Does not apply route daily. 1 each 0  . naproxen (NAPROSYN) 500 MG tablet Take 1 tablet (500 mg total) by mouth 2 (two) times daily with a meal. 30 tablet 0   No facility-administered medications prior to visit.      ROS Review of Systems  Constitutional: Negative for chills, fever and malaise/fatigue.  Eyes: Negative for blurred vision.  Respiratory: Negative for shortness of breath.   Cardiovascular: Negative for chest pain and palpitations.  Gastrointestinal: Negative for abdominal pain and nausea.  Genitourinary: Negative for dysuria and hematuria.  Musculoskeletal: Negative for joint pain and myalgias.       Right heel pain  Skin: Negative for rash.       Lesion above lip  Neurological: Negative for tingling and headaches.   Psychiatric/Behavioral: Negative for depression. The patient is not nervous/anxious.     Objective:  BP 110/66 (BP Location: Left Arm, Patient Position: Sitting, Cuff Size: Large)   Pulse (!) 58   Temp 98.5 F (36.9 C) (Oral)   Resp 16   Ht 5' (1.524 m)   Wt 189 lb 3.2 oz (85.8 kg)   SpO2 97%   BMI 36.95 kg/m   BP/Weight 12/13/2016 11/09/2016 9/48/5462  Systolic BP 703 500 938  Diastolic BP 66 74 71  Wt. (Lbs) 189.2 189.4 192  BMI 36.95 32.51 35.11      Physical Exam  Constitutional: She is oriented to person, place, and time.  Well developed, well nourished, NAD, polite  HENT:  Head: Normocephalic and atraumatic.  Eyes: No scleral icterus.  Neck: Normal range of motion. Neck supple. No thyromegaly present.  Cardiovascular: Normal rate, regular rhythm and normal heart sounds.   Pulmonary/Chest: Effort normal and breath sounds normal.  Abdominal: Soft. Bowel sounds are normal. There is no tenderness.  Musculoskeletal: She exhibits no edema.  Pain in the medial and central band of the right plantar fascia  Neurological: She is alert and oriented to person, place, and time. No cranial nerve deficit. Coordination normal.  Skin: Skin is warm and dry. No rash noted. No erythema. No pallor.  0.4 cm, round, domed lesion superior to the left upper lip with no irregular borders, central dimpling, or telangiectasia.   Psychiatric: She has a normal mood and affect. Her behavior is normal.  Thought content normal.  Vitals reviewed.    Assessment & Plan:    1. Plantar fasciitis of right foot - Begin traMADol (ULTRAM) 50 MG tablet; Take 1 tablet (50 mg total) by mouth every 6 (six) hours as needed.  Dispense: 28 tablet; Refill: 0 - Begin ibuprofen (ADVIL,MOTRIN) 800 MG tablet; Take 1 tablet (800 mg total) by mouth every 8 (eight) hours as needed.  Dispense: 90 tablet; Refill: 0 - Ambulatory referral to [facility] Complete Right; Future  2. Right foot pain - [facility]  Complete Right; Future  3. Sebaceous cyst - I&D conducted in clinic with successful drainage of keratotic material. Hemostasis achieved, no complications, and patient left under own power.   Meds ordered this encounter  Medications  . traMADol (ULTRAM) 50 MG tablet    Sig: Take 1 tablet (50 mg total) by mouth every 6 (six) hours as needed.    Dispense:  28 tablet    Refill:  0    Order Specific Question:   Supervising Provider    Answer:   Tresa Garter [id number]  . ibuprofen (ADVIL,MOTRIN) 800 MG tablet    Sig: Take 1 tablet (800 mg total) by mouth every 8 (eight) hours as needed.    Dispense:  90 tablet    Refill:  0    Order Specific Question:   Supervising Provider    Answer:   Tresa Garter [id number]    Follow-up: Return in about 2 weeks (around 12/27/2016) for plantar fasciitis.   Clent Demark PA

## 2016-12-20 ENCOUNTER — Ambulatory Visit (HOSPITAL_COMMUNITY)
Admission: RE | Admit: 2016-12-20 | Discharge: 2016-12-20 | Disposition: A | Discharge status: Home / Self Care | Payer: Self-pay | Source: Ambulatory Visit | Attending: Physician Assistant | Admitting: Physician Assistant

## 2016-12-20 DIAGNOSIS — M778 Other enthesopathies, not elsewhere classified: Secondary | ICD-10-CM | POA: Insufficient documentation

## 2016-12-20 DIAGNOSIS — M79671 Pain in right foot: Secondary | ICD-10-CM

## 2016-12-20 DIAGNOSIS — M722 Plantar fascial fibromatosis: Secondary | ICD-10-CM

## 2016-12-21 ENCOUNTER — Other Ambulatory Visit (INDEPENDENT_AMBULATORY_CARE_PROVIDER_SITE_OTHER): Payer: Self-pay | Admitting: Physician Assistant

## 2016-12-21 DIAGNOSIS — M7731 Calcaneal spur, right foot: Secondary | ICD-10-CM

## 2016-12-28 ENCOUNTER — Ambulatory Visit: Payer: Self-pay

## 2016-12-29 ENCOUNTER — Ambulatory Visit (INDEPENDENT_AMBULATORY_CARE_PROVIDER_SITE_OTHER): Payer: Self-pay | Admitting: Physician Assistant

## 2016-12-29 ENCOUNTER — Encounter (INDEPENDENT_AMBULATORY_CARE_PROVIDER_SITE_OTHER): Payer: Self-pay | Admitting: Physician Assistant

## 2016-12-29 VITALS — BP 149/75 | HR 51 | Temp 97.9°F | Wt 187.8 lb

## 2016-12-29 DIAGNOSIS — M7731 Calcaneal spur, right foot: Secondary | ICD-10-CM

## 2016-12-29 MED ORDER — ACETAMINOPHEN-CODEINE #3 300-30 MG PO TABS: 1.0000 | ORAL_TABLET | Freq: Three times a day (TID) | ORAL | 0 refills | Status: AC | PRN

## 2016-12-29 MED ORDER — IBUPROFEN 800 MG PO TABS: 800.0000 mg | ORAL_TABLET | Freq: Three times a day (TID) | ORAL | 0 refills | Status: DC | PRN

## 2016-12-29 NOTE — Patient Instructions (Signed)
Espoln calcneo (Heel Spur) Los espolones calcneos son protuberancias seas que se forman en la parte inferior del hueso del taln (calcneo). Son frecuentes y no siempre Administrator, Civil Service. Sin embargo, a menudo Conservator, museum/gallery en la banda de tejido resistente que se extiende por debajo del hueso del pie (fascia plantar). Cuando esto sucede, puede sentir [employer] parte inferior del pie, cerca del taln. CAUSAS No se conoce con exactitud la causa de los espolones calcneos. Pueden deberse a la presin ejercida sobre el taln, o bien originarse en las inserciones musculares (tendones) cercanas que traccionan en el taln. FACTORES DE RIESGO Puede estar en riesgo de tener un espoln calcneo si:  Tiene ms de 40 aos.  Tiene sobrepeso.  Padece artritis por uso y desgaste (artrosis).  Presenta inflamacin en la fascia plantar. SIGNOS Y SNTOMAS Algunas personas tienen espolones calcneos aunque no presentan sntomas. Si tiene sntomas, estos pueden incluir los siguientes:  Dolor en la parte inferior del taln.  Dolor que empeora al levantarse de la cama por primera vez.  Dolor que empeora despus de caminar o ponerse de pie. DIAGNSTICO El mdico podr hacer el diagnstico del espoln calcneo en funcin de sus sntomas y del examen fsico. Tambin es posible que le realicen una radiografa del pie para detectar la presencia de una protuberancia sea que proviene del calcneo. TRATAMIENTO El tratamiento pretende aliviar el dolor del espoln calcneo. Este puede incluir lo siguiente:  Ejercicios de Estate manager/land agent.  Bajar de [place].  Usar calzado, plantillas o aparatos ortopdicos especficos para mayor comodidad y 70.  Usar frulas a la noche para Animator en una posicin apropiada.  Tomar medicamentos de venta libre para Best boy.  Recibir tratamiento con ondas sonoras de alta intensidad para fragmentar el espoln (terapia extracorprea por ondas de  choque).  Recibir inyecciones de corticoides en el taln para reducir la hinchazn y Best boy.  Someterse a Civil engineer, contracting calcneo causa dolor a Barrister's clerk (crnico). Juniata los medicamentos solamente como se lo haya indicado el mdico.  Pregunte a su mdico si debe aplicar hielo o compresas fras en las zonas dolorosas del taln o del pie.  Evite las actividades que puedan causarle dolor hasta que se recupere o como se lo haya indicado el mdico.  [place] antes de hacer ejercicio o actividad fsica.  Use zapatos con un buen apoyo que le calcen bien como le haya indicado su mdico. Es posible que necesite comprar zapatos nuevos. Si [place] zapatos viejos o que no le calzan bien no obtendr el apoyo que necesita.  Baje de peso si su mdico considera que debera [place]. Esto podra aliviar la presin en el pie que puede estar causando el dolor y las [place]. SOLICITE ATENCIN MDICA SI:  El dolor contina o empeora. Esta informacin no tiene Marine scientist el consejo del mdico. Asegrese de hacerle al mdico cualquier pregunta que tenga. Document Released: 02/02/2006 Document Revised: 05/09/2014 Document Reviewed: 06/19/2013 Elsevier Interactive Patient Education  [employer].

## 2016-12-29 NOTE — Progress Notes (Signed)
   Subjective:  Patient ID: Oliver Pila, female    DOB: 02-28-1966  Age: 51 y.o. MRN: [id number]  CC: plantar fasciitis  HPI  Kiylee Thoreson Huertais a 51 y.o.femalewith no significant PMH presents with right heel pain. Onset of right heel pain 9 months ago. 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 Advil is mildly effective and only worked temporarily. Was prescribed Tramadol and Ibuprofen with only temporary relief. XR revealed prominent calcaneal spur.     ROS Review of Systems  Constitutional: Negative for chills, fever and malaise/fatigue.  Eyes: Negative for blurred vision.  Respiratory: Negative for shortness of breath.   Cardiovascular: Negative for chest pain and palpitations.  Gastrointestinal: Negative for abdominal pain and nausea.  Genitourinary: Negative for dysuria and hematuria.  Musculoskeletal: Negative for joint pain and myalgias.  Skin: Negative for rash.  Neurological: Negative for tingling and headaches.  Psychiatric/Behavioral: Negative for depression. The patient is not nervous/anxious.     Objective:  BP (!) 149/75 (BP Location: Left Arm, Patient Position: Sitting, Cuff Size: Normal)   Pulse (!) 51   Temp 97.9 F (36.6 C) (Oral)   Wt 187 lb 12.8 oz (85.2 kg)   LMP 12/10/2016 (Exact Date)   SpO2 97%   BMI 36.68 kg/m   BP/Weight 12/29/2016 12/13/2016 10/13/4313  Systolic BP 400 867 619  Diastolic BP 75 66 74  Wt. (Lbs) 187.8 189.2 189.4  BMI 36.68 36.95 32.51      Physical Exam  Constitutional: She is oriented to person, place, and time.  Well developed, overweight, NAD, polite  HENT:  Head: Normocephalic and atraumatic.  Eyes: No scleral icterus.  Cardiovascular: Normal rate, regular rhythm and normal heart sounds.   Pulmonary/Chest: Effort normal and breath sounds normal.  Musculoskeletal: She exhibits no edema.  Neurological: She is alert and oriented to person, place, and time.  Skin: Skin is warm and dry. No rash noted.  No erythema. No pallor.  Psychiatric: She has a normal mood and affect. Her behavior is normal. Thought content normal.  Vitals reviewed.    Assessment & Plan:    1. Heel spur, right - acetaminophen-codeine (TYLENOL #3) 300-30 MG tablet; Take 1 tablet by mouth every 8 (eight) hours as needed for moderate pain.  Dispense: 45 tablet; Refill: 0  2. Plantar fasciitis of right foot - ibuprofen (ADVIL,MOTRIN) 800 MG tablet; Take 1 tablet (800 mg total) by mouth every 8 (eight) hours as needed.  Dispense: 45 tablet; Refill: 0   Meds ordered this encounter  Medications  . acetaminophen-codeine (TYLENOL #3) 300-30 MG tablet    Sig: Take 1 tablet by mouth every 8 (eight) hours as needed for moderate pain.    Dispense:  45 tablet    Refill:  0    Order Specific Question:   Supervising Provider    Answer:   Tresa Garter [id number]  . ibuprofen (ADVIL,MOTRIN) 800 MG tablet    Sig: Take 1 tablet (800 mg total) by mouth every 8 (eight) hours as needed.    Dispense:  45 tablet    Refill:  0    Order Specific Question:   Supervising Provider    Answer:   Tresa Garter [[id number]]    Follow-up: Return in about 8 weeks (around 02/23/2017) for full physical.   Clent Demark PA

## 2016-12-30 ENCOUNTER — Ambulatory Visit: Payer: Self-pay | Attending: Physician Assistant

## 2017-03-02 DIAGNOSIS — M5432 Sciatica, left side: Secondary | ICD-10-CM

## 2017-03-02 HISTORY — DX: Sciatica, left side: M54.32

## 2017-03-06 ENCOUNTER — Encounter: Payer: Self-pay | Admitting: Podiatry

## 2017-03-06 ENCOUNTER — Ambulatory Visit: Payer: No Typology Code available for payment source | Admitting: Podiatry

## 2017-03-06 ENCOUNTER — Ambulatory Visit: Payer: No Typology Code available for payment source

## 2017-03-06 VITALS — BP 120/71 | HR 61

## 2017-03-06 DIAGNOSIS — M722 Plantar fascial fibromatosis: Secondary | ICD-10-CM

## 2017-03-06 DIAGNOSIS — M21619 Bunion of unspecified foot: Secondary | ICD-10-CM

## 2017-03-06 MED ORDER — DICLOFENAC SODIUM 75 MG PO TBEC: 75.0000 mg | DELAYED_RELEASE_TABLET | Freq: Two times a day (BID) | ORAL | 0 refills | Status: DC

## 2017-03-08 NOTE — Progress Notes (Signed)
   Subjective: Patient presents today for shooting pain and tenderness in the right heel that began 3-4 years ago. She reports minimal pain to the left heel as well. Patient states that it hurts in the mornings with the first steps out of bed. Ambulation after a long period of rest also increases the pain. She has used OTC creams for treatment with some temporary relief. Patient presents today for further treatment and evaluation.   History reviewed. No pertinent past medical history.   Objective: Physical Exam General: The patient is alert and oriented x3 in no acute distress.  Dermatology: Skin is warm, dry and supple bilateral lower extremities. Negative for open lesions or macerations bilateral.   Vascular: Dorsalis Pedis and Posterior Tibial pulses palpable bilateral.  Capillary fill time is immediate to all digits.  Neurological: Epicritic and protective threshold intact bilateral.   Musculoskeletal: Tenderness to palpation at the medial calcaneal tubercale and through the insertion of the plantar fascia of the right foot. Clinical evidence of bunion deformity noted to the respective foot. There is a moderate pain on palpation range of motion of the first MPJ. Lateral deviation of the hallux noted consistent with hallux abductovalgus. All other joints range of motion within normal limits bilateral. Strength 5/5 in all groups bilateral.   Radiographic exam: Calcaneal spur present with mild thickening of plantar fascia right. Increased intermetatarsal angle greater than 15 with a hallux abductus angle greater than 30 noted on AP view. Moderate degenerative changes noted within the first MPJ.No other soft tissue abnormalities or radiopaque foreign bodies.   Assessment: 1. Plantar fasciitis right 2. 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 Patient evaluated. Xrays reviewed.   2. Injection of 0.5cc Celestone soluspan injected into the right plantar fascia    3. Rx for Diclofenac 75mg  PO BID ordered for patient. 4. Instructed patient regarding therapies and modalities at home to alleviate symptoms.  5. Return to clinic in 4 weeks.     [name], DPM [facility]  Dr. [name], DPM    [address], [place] [address]                Office [phone]  Fax [phone]

## 2017-03-09 ENCOUNTER — Encounter (INDEPENDENT_AMBULATORY_CARE_PROVIDER_SITE_OTHER): Payer: Self-pay | Admitting: Physician Assistant

## 2017-03-09 ENCOUNTER — Ambulatory Visit (INDEPENDENT_AMBULATORY_CARE_PROVIDER_SITE_OTHER): Payer: Self-pay | Admitting: Physician Assistant

## 2017-03-09 VITALS — BP 123/78 | HR 55 | Temp 97.7°F | Wt 184.2 lb

## 2017-03-09 DIAGNOSIS — S0501XA Injury of conjunctiva and corneal abrasion without foreign body, right eye, initial encounter: Secondary | ICD-10-CM

## 2017-03-09 DIAGNOSIS — K0889 Other specified disorders of teeth and supporting structures: Secondary | ICD-10-CM

## 2017-03-09 MED ORDER — TOBRAMYCIN 0.3 % OP SOLN: 2.0000 [drp] | OPHTHALMIC | 0 refills | Status: AC

## 2017-03-09 MED ORDER — ACETAMINOPHEN 500 MG PO TABS: 1000.0000 mg | ORAL_TABLET | Freq: Three times a day (TID) | ORAL | 0 refills | Status: AC | PRN

## 2017-03-09 MED ORDER — AMOXICILLIN-POT CLAVULANATE 875-125 MG PO TABS: 1.0000 | ORAL_TABLET | Freq: Two times a day (BID) | ORAL | 0 refills | Status: DC

## 2017-03-09 NOTE — Addendum Note (Signed)
Addended by: Domenica Fail D on: 03/09/2017 09:33 AM   Modules accepted: Orders

## 2017-03-09 NOTE — Patient Instructions (Signed)
Por favor visite [email or web address] para buscar un dentista que accepte pacientes sin seguro.   Dolor dental (Dental Pain) Las causas del dolor dental pueden ser muchas, entre ellas, las siguientes:  Caries. La caries hace que el nervio del diente est expuesto al aire y a las temperaturas calientes o fras. Esto puede causar dolor o molestias.  Infeccin o absceso. Un absceso dental es un rea llena de pus infectado debido a una infeccin bacteriana en la parte interna del diente (pulpa). Por lo general, se produce en el extremo de la raz del diente.  Lesiones.  Una razn desconocida (idioptica). El dolor puede ser leve o intenso. El dolor quizs aparezca solamente en estas ocasiones:  Al masticar.  Al estar expuesto a una temperatura caliente o fra.  Al comer alimentos o tomar bebidas con azcar, por ejemplo, gaseosas o caramelos. El dolor tambin puede ser constante. INSTRUCCIONES PARA EL CUIDADO EN EL HOGAR Controle el dolor dental a fin de Recruitment consultant cambio. Las siguientes medidas pueden servir para Public house manager cualquier molestia que est sintiendo:  Tome los medicamentos solamente como se lo haya indicado el dentista.  Si le recetaron antibiticos, asegrese de terminarlos, incluso si comienza a Sports administrator.  Concurra a todas las visitas de control como se lo haya indicado el dentista. Esto es importante.  No se aplique calor en la parte externa de la cara.  Si el dentista se lo ha indicado, enjuguese la boca o haga grgaras con agua salada. Esto ayuda a Best boy y Forensic psychologist. ? Puede preparar agua salada agregando  de cucharadita de sal en 1taza de agua templada.  Aplquese hielo sobre la zona dolorida de la cara: ? Ponga el hielo en una bolsa plstica. ? Coloque una [employer] piel y la bolsa de hielo. ? Coloque el hielo durante 59minutos, 2 a 3veces por da.  Evite los alimentos o las bebidas que le causen [place], como los  siguientes: ? [place] o bebidas muy calientes o muy fros. ? Alimentos o bebidas dulces o con azcar. SOLICITE ATENCIN MDICA SI:  El dolor no se alivia con los [employer].  Los sntomas empeoran.  Aparecen nuevos sntomas.  SOLICITE ATENCIN MDICA DE INMEDIATO SI:  No puede abrir la boca.  Tiene problemas para respirar o tragar.  Tiene fiebre.  Tiene hinchazn en la cara, el cuello o la mandbula.  Esta informacin no tiene Marine scientist el consejo del mdico. Asegrese de hacerle al mdico cualquier pregunta que tenga. Document Released: 04/18/2005 Document Revised: 09/02/2014 Document Reviewed: 04/14/2014 Elsevier Interactive Patient Education  2017 [employer].

## 2017-03-09 NOTE — Progress Notes (Addendum)
Subjective:  Patient ID: Lori Morse, female    DOB: April 16, 1966  Age: 51 y.o. MRN: [id number]  CC: dental pain  HPI Lori Morse a 51 y.o.femalewith a medical history of plantar fasciitis  presents with dental pain x2 months. Tooth number 20 has lost its filling and is causing pain upon chewing and drinking.  Air is causing pain in the affected tooth also. Says her face was swollen previous to today's visit but self resolved.  Does not endorse neck swelling, tongue swelling, drooling, SOB, chest pain, palpitations, f/c/n/v. Does not have a dentist and would like to be referred.    Complains of sensation of "something in my eye". Onset two days. Pain/sensation in the lateral part of eye. No visual blurring, diplopia, or limited EOM.      Outpatient Medications Prior to Visit  Medication Sig Dispense Refill  . diclofenac (VOLTAREN) 75 MG EC tablet Take 1 tablet (75 mg total) 2 (two) times daily by mouth. 60 tablet 0  . Elastic Bandages & Supports (PLANTAR FASCIITIS ARCH SLEEVE) MISC 1 application by Does not apply route daily. 1 each 0  . ibuprofen (ADVIL,MOTRIN) 800 MG tablet Take 1 tablet (800 mg total) by mouth every 8 (eight) hours as needed. 45 tablet 0   No facility-administered medications prior to visit.      ROS Review of Systems  Constitutional: Negative for chills, fever and malaise/fatigue.  HENT:       Dental pain.  Eyes: Negative for blurred vision.  Respiratory: Negative for shortness of breath.   Cardiovascular: Negative for chest pain and palpitations.  Gastrointestinal: Negative for abdominal pain and nausea.  Genitourinary: Negative for dysuria and hematuria.  Musculoskeletal: Negative for joint pain and myalgias.  Skin: Negative for rash.  Neurological: Negative for tingling and headaches.  Psychiatric/Behavioral: Negative for depression. The patient is not nervous/anxious.     Objective:  BP 123/78 (BP Location: Left Arm, Patient  Position: Sitting, Cuff Size: Normal)   Pulse (!) 55   Temp 97.7 F (36.5 C) (Oral)   Wt 184 lb 3.2 oz (83.6 kg)   LMP 03/09/2017 (Exact Date)   SpO2 97%   BMI 35.97 kg/m   BP/Weight 03/09/2017 03/06/2017 0/37/0488  Systolic BP 891 694 503  Diastolic BP 78 71 75  Wt. (Lbs) 184.2 - 187.8  BMI 35.97 - 36.68      Physical Exam  Constitutional: She is oriented to person, place, and time.  Well developed, overweight, NAD, polite  HENT:  Head: Normocephalic and atraumatic.  Tooth number 20 with partially missing filling. No sign of abscess, bleeding, facial swelling, or Ludwig's angina.  Eyes: No scleral icterus.  Neck: Normal range of motion. Neck supple.  Pulmonary/Chest: Effort normal.  Lymphadenopathy:    She has no cervical adenopathy.  Neurological: She is alert and oriented to person, place, and time.  Skin: Skin is warm and dry. No rash noted. No erythema. No pallor.  Psychiatric: She has a normal mood and affect. Her behavior is normal. Thought content normal.  Vitals reviewed.    Assessment & Plan:   1. Pain, dental - Begin Augmentin  - Begin Tylenol Extra Strength - Urgent referral to dentistry - Patient given information to the website of [place] to look for a dentist that may accept to see uninsured patients.   2. Corneal abrasion - Begin Tobramycin - Advised to see ophthalmologist if pain persists after 24 hrs of treatment.  Meds ordered this encounter  Medications  . amoxicillin-clavulanate (AUGMENTIN) 875-125 MG tablet    Sig: Take 1 tablet 2 (two) times daily by mouth.    Dispense:  20 tablet    Refill:  0    Order Specific Question:   Supervising Provider    Answer:   Tresa Garter [id number]  . acetaminophen (TYLENOL) 500 MG tablet    Sig: Take 2 tablets (1,000 mg total) every 8 (eight) hours as needed for up to 7 days by mouth.    Dispense:  21 tablet    Refill:  0    Order Specific Question:   Supervising Provider    Answer:    Tresa Garter [[id number]]    Follow-up: PRN  Clent Demark PA

## 2017-03-13 ENCOUNTER — Ambulatory Visit (INDEPENDENT_AMBULATORY_CARE_PROVIDER_SITE_OTHER): Payer: Self-pay | Admitting: Physician Assistant

## 2017-03-21 ENCOUNTER — Emergency Department (HOSPITAL_COMMUNITY)
Admission: EM | Admit: 2017-03-21 | Discharge: 2017-03-21 | Disposition: A | Discharge status: Home / Self Care | Payer: Self-pay | Attending: Emergency Medicine | Admitting: Emergency Medicine

## 2017-03-21 ENCOUNTER — Encounter (HOSPITAL_COMMUNITY): Payer: Self-pay | Admitting: Emergency Medicine

## 2017-03-21 DIAGNOSIS — M5432 Sciatica, left side: Secondary | ICD-10-CM | POA: Insufficient documentation

## 2017-03-21 DIAGNOSIS — Z79899 Other long term (current) drug therapy: Secondary | ICD-10-CM | POA: Insufficient documentation

## 2017-03-21 DIAGNOSIS — Z791 Long term (current) use of non-steroidal anti-inflammatories (NSAID): Secondary | ICD-10-CM | POA: Insufficient documentation

## 2017-03-21 MED ORDER — OXYCODONE-ACETAMINOPHEN 5-325 MG PO TABS: 1.0000 | ORAL_TABLET | ORAL | 0 refills | Status: DC | PRN

## 2017-03-21 NOTE — Discharge Instructions (Signed)
Please read attached information. If you experience any new or worsening signs or symptoms please return to the emergency room for evaluation. Please follow-up with your primary care provider or specialist as discussed. Please use medication prescribed only as directed and discontinue taking if you have any concerning signs or symptoms.   °

## 2017-03-21 NOTE — ED Provider Notes (Signed)
[facility] EMERGENCY DEPARTMENT Provider Note   CSN: [id number] Arrival date & time: 03/21/17  1457     History   Chief Complaint Chief Complaint  Patient presents with  . Back Pain    HPI Lori Morse is a 51 y.o. female.  HPI translator used   51 year old Spanish-speaking female presents today with back pain.  Patient denies any significant past medical history back pain.  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  She notes immediate pain to her right lower back hip with radiation down the posterior aspect of her leg.  She denies any loss of distal sensation strength and motor function.  Patient notes she has been using prescribed anti-inflammatories without significant improvement.  Patient denies any trauma.   History reviewed. No pertinent past medical history.  There are no active problems to display for this patient.   History reviewed. No pertinent surgical history.  OB History    No data available       Home Medications    Prior to Admission medications   Medication Sig Start Date End Date Taking? Authorizing Provider  amoxicillin-clavulanate (AUGMENTIN) 875-125 MG tablet Take 1 tablet 2 (two) times daily by mouth. 03/09/17   Clent Demark, PA-C  diclofenac (VOLTAREN) 75 MG EC tablet Take 1 tablet (75 mg total) 2 (two) times daily by mouth. 03/06/17   [name], DPM  Elastic Bandages & Supports (PLANTAR FASCIITIS ARCH SLEEVE) MISC 1 application by Does not apply route daily. 11/09/16   Clent Demark, PA-C  ibuprofen (ADVIL,MOTRIN) 800 MG tablet Take 1 tablet (800 mg total) by mouth every 8 (eight) hours as needed. 12/29/16   Clent Demark, PA-C  oxyCODONE-acetaminophen (PERCOCET/ROXICET) 5-325 MG tablet Take 1 tablet by mouth every 4 (four) hours as needed for severe pain. 03/21/17   Okey Regal, PA-C    Family History History reviewed. No pertinent  family history.  Social History Social History   Tobacco Use  . Smoking status: Never Smoker  . Smokeless tobacco: Never Used  Substance Use Topics  . Alcohol use: No  . Drug use: No     Allergies   Patient has no known allergies.   Review of Systems Review of Systems  All other systems reviewed and are negative.    Physical Exam Updated Vital Signs BP (!) 138/97 (BP Location: Right Arm)   Pulse 72   Temp 98.7 F (37.1 C) (Oral)   Resp 18   LMP 03/09/2017 (Exact Date)   SpO2 99%   Physical Exam  Constitutional: She is oriented to person, place, and time. She appears well-developed and well-nourished.  HENT:  Head: Normocephalic and atraumatic.  Eyes: Conjunctivae are normal. Pupils are equal, round, and reactive to light. Right eye exhibits no discharge. Left eye exhibits no discharge. No scleral icterus.  Neck: Normal range of motion. No JVD present. No tracheal deviation present.  Pulmonary/Chest: Effort normal. No stridor.  Musculoskeletal:  No CT or L-spine tenderness, tenderness palpation of the right lateral lumbar and posterior hip, no redness or swelling-straight leg is negative, distal sensation strength and motor function is intact  Neurological: She is alert and oriented to person, place, and time. Coordination normal.  Psychiatric: She has a normal mood and affect. Her behavior is normal. Judgment and thought content normal.  Nursing note and vitals reviewed.    ED Treatments / Results  Labs (  all labs ordered are listed, but only abnormal results are displayed) Labs Reviewed - No data to display  EKG  EKG Interpretation None       Radiology No results found.  Procedures Procedures (including critical care time)  Medications Ordered in ED Medications - No data to display   Initial Impression / Assessment and Plan / ED Course  I have reviewed the triage vital signs and the nursing notes.  Pertinent labs & imaging results that were  available during my care of the patient were reviewed by me and considered in my medical decision making (see chart for details).     51 year old female presents today with likely sciatica.  She has no distal neurological deficits, no red flags.  She ambulates without significant difficulty.  Patient will be given a short course of pain medication encouraged follow-up with her primary care, back exercises given, return precautions given.  Patient verbalized understanding and agreement to today's plan had no further questions or concerns.  Final Clinical Impressions(s) / ED Diagnoses   Final diagnoses:  Sciatica of left side    ED Discharge Orders        Ordered    oxyCODONE-acetaminophen (PERCOCET/ROXICET) 5-325 MG tablet  Every 4 hours PRN     03/21/17 1704       Okey Regal, PA-C 03/22/17 1127    Mabe, Forbes Cellar, MD 03/25/17 [phone]

## 2017-03-21 NOTE — ED Triage Notes (Signed)
Pt to ER for evaluation of right lateral lumbar back pain radiating down right leg onset yesterday while at work picking up a "carrier." pt is in NAD. Ambulatory. Denies medical hx.

## 2017-03-21 NOTE — ED Notes (Signed)
Spanish Interpreter on speaker phone at this time with ED provider and this RN at bedside

## 2017-03-29 ENCOUNTER — Encounter (INDEPENDENT_AMBULATORY_CARE_PROVIDER_SITE_OTHER): Payer: Self-pay | Admitting: Physician Assistant

## 2017-03-29 ENCOUNTER — Other Ambulatory Visit: Payer: Self-pay

## 2017-03-29 ENCOUNTER — Ambulatory Visit (INDEPENDENT_AMBULATORY_CARE_PROVIDER_SITE_OTHER): Payer: Self-pay | Admitting: Physician Assistant

## 2017-03-29 VITALS — BP 118/71 | HR 65 | Temp 98.0°F | Wt 185.2 lb

## 2017-03-29 DIAGNOSIS — M5431 Sciatica, right side: Secondary | ICD-10-CM

## 2017-03-29 MED ORDER — SENNOSIDES-DOCUSATE SODIUM 8.6-50 MG PO TABS: 1.0000 | ORAL_TABLET | Freq: Two times a day (BID) | ORAL | 0 refills | Status: DC

## 2017-03-29 MED ORDER — PREDNISONE 20 MG PO TABS: 20.0000 mg | ORAL_TABLET | Freq: Every day | ORAL | 0 refills | Status: DC

## 2017-03-29 MED ORDER — HYDROCODONE-ACETAMINOPHEN 5-325 MG PO TABS: 1.0000 | ORAL_TABLET | Freq: Four times a day (QID) | ORAL | 0 refills | Status: DC | PRN

## 2017-03-29 NOTE — Patient Instructions (Signed)
Citica (Sciatica) La citica es Conservation officer, historic buildings, entumecimiento, debilidad u hormigueo a lo largo del nervio citico. El nervio citico comienza en la parte inferior de la espalda y desciende por la parte posterior de cada pierna. Controla los msculos en la parte inferior de las piernas y en la parte posterior de las rodillas. Tambin otorga sensibilidad a la parte posterior de los muslos, la parte inferior de las piernas y la planta de los pies. La citica es un sntoma de otra afeccin que ejerce presin o "pellizca" el nervio citico. Generalmente la citica afecta slo un lado del cuerpo. Suele desaparecer por s sola o con tratamiento. En algunos casos, la Geographical information systems officer a Arts administrator . CAUSAS Esta afeccin causa presin sobre el nervio citico o lo "pellizca". Esto puede ser el resultado de:  Un disco que sobresale demasiado (hernia de disco) entre los huesos de la columna vertebral (vrtebras).  Cambios relacionados con la edad en los discos de la columna vertebral (discopata degenerativa).  Un trastorno doloroso que afecta un msculo de los glteos (sndrome piriforme).  Un crecimiento seo adicional (espoln seo) cerca del nervio citico.  Una lesin o fractura de la pelvis.  Embarazo.  Tumor (poco frecuente). [place] pueden hacer que usted sea propenso a sufrir esta afeccin:  Sales promotion account executive los que se ejerce presin sobre la columna vertebral o en los que la columna realiza mucho esfuerzo, como el ftbol americano o el levantamiento de pesas.  Tener poca fuerza y flexibilidad.  Antecedentes mdicos de lesiones en la espalda.  Antecedentes mdicos de ciruga en la espalda.  Estar sentado durante largos perodos.  Realizar actividades que requieren agacharse o levantar objetos en forma repetida.  Obesidad. SNTOMAS Los sntomas pueden ser leves o graves, y pueden incluir los siguientes:  Cualquiera de los siguientes problemas  en la parte inferior de la [place], piernas, cadera o glteos: ? Hormigueo leve o dolor sordo. ? Sensacin de ardor. ? Dolor agudo.  Adormecimiento de la parte posterior de la pantorrilla o la planta del pie.  Debilidad en las piernas.  Dolor de espalda intenso que dificulta el movimiento. Estos sntomas podran empeorar al toser, estornudar o rerse, o cuando se est sentado o de pie durante perodos prolongados. El sobrepeso tambin puede [place] sntomas. En algunos casos, los sntomas regresan luego de un [place]. DIAGNSTICO Esta afeccin se puede diagnosticar en funcin de lo siguiente:  Sus sntomas.  Un examen fsico. El mdico podra indicarle que realice ciertos movimientos para controlar si estos desencadenan los sntomas.  Tambin pueden hacerle exmenes que incluyen lo siguiente: ? Anlisis de [place]. ? Radiografas. ? Resonancia magntica (RM). ? Tomografa computarizada (TC). TRATAMIENTO En muchos casos, esta afeccin mejora por s sola, sin ningn tratamiento. Sin embargo, Dispensing optician puede incluir lo siguiente:  Reduccin o modificacin de la actividad fsica en los perodos de Social research officer, government.  Ejercicios y estiramiento para fortalecer el abdomen y Teacher, English as a foreign language la flexibilidad de la columna vertebral.  Aplicacin de calor o hielo en la zona afectada.  Medicamentos para lo siguiente: ? Aliviar el dolor y la inflamacin. ? Relajar los msculos.  Medicamentos inyectables que ayudan a Best boy, la irritacin y la inflamacin alrededor del nervio citico (esteroides).  Ciruga. Babbie los medicamentos de venta libre y los recetados solamente como se lo haya indicado el mdico.  No conduzca ni opere maquinaria pesada mientras toma analgsicos recetados. Control del dolor  Si se lo indican, aplique hielo en la zona afectada. ? Ponga el hielo en una bolsa plstica. ? Coloque una [employer] piel y la  bolsa de hielo. ? Coloque el hielo durante 20 minutos, 2 a 3 veces por da.  Despus del hielo, aplique calor sobre la zona afectada antes de Optometrist ejercicio o con la frecuencia que le haya indicado el mdico. Use la fuente de calor que el mdico le recomiende, como una compresa de calor hmedo o una almohadilla trmica. ? Coloque una [employer] piel y la fuente de Freight forwarder. ? Aplique el calor durante 20 a 74minutos. ? Retire la fuente de calor si la piel se le pone de color rojo brillante. Esto es muy importante si no puede sentir el dolor, el calor o el fro. Puede correr un riesgo mayor de sufrir quemaduras. Actividad  Reanude sus actividades normales como se lo haya indicado el mdico. Pregntele al mdico qu actividades son seguras para usted. ? Evite las [employer] sntomas.  Durante el da, descanse durante lapsos breves. Descansar recostado o de pie suele ser mejor que hacerlo sentado. ? Cuando descanse durante perodos ms largos, incorpore alguna [place] o ejercicios de [employer] perodos. Esto ayudar a Mining engineer rigidez y Conservation officer, historic buildings. ? Evite estar sentado durante largos perodos sin moverse. Levntese y Reese al menos una vez cada hora.  Haga ejercicio y elongue habitualmente, como se lo haya indicado el mdico.  No levante nada que pese ms de 10libras (4,5kg) mientras tenga sntomas de citica. Aunque no tenga sntomas, evite levantar objetos pesados, en especial en forma repetida.  Siempre use las tcnicas de levantamiento correctas para levantar objetos, entre ellas: ? Flexionar las rodillas. ? Mantener la carga cerca del cuerpo. ? No torcerse. Instrucciones generales  Mantenga una buena postura. ? Evite reclinarse hacia adelante cuando est sentado. ? Evite encorvar la espalda mientras est de pie.  Mantenga un peso saludable. El exceso de peso ejerce presin adicional sobre la espalda y hace que resulte difcil mantener una  buena [place].  Use calzado con buen apoyo y cmodo. Evite usar tacones.  Evite dormir sobre un colchn que sea demasiado blando o demasiado duro. Un colchn que ofrezca un apoyo suficientemente firme para su espalda al dormir puede ayudar a Best boy.  Concurra a todas las visitas de control como se lo haya indicado el mdico. Esto es importante. SOLICITE ATENCIN MDICA SI:  El dolor lo despierta cuando est dormido.  El dolor empeora cuando se [place].  El dolor es peor del que experiment en el pasado.  Los sntomas duran ms de 4 semanas.  Pierde peso en forma inexplicable.  SOLICITE ATENCIN MDICA DE INMEDIATO SI:  Pierde el control de la vejiga o del intestino (incontinencia).  Tiene los siguientes sntomas: ? Debilidad que empeora en la parte inferior de la espalda, la pelvis, los glteos o las piernas. ? Enrojecimiento o inflamacin en la espalda. ? Sensacin de ardor al [employer].  Esta informacin no tiene Marine scientist el consejo del mdico. Asegrese de hacerle al mdico cualquier pregunta que tenga. Document Released: 04/18/2005 Document Revised: 08/10/2015 Document Reviewed: 12/26/2014 Elsevier Interactive Patient Education  2017 [employer].

## 2017-03-29 NOTE — Progress Notes (Signed)
Subjective:  Patient ID: Lori Morse, female    DOB: [birth year]  Age: 51 y.o. MRN: [id number]  CC: leg pain  HPI  Lori Morse a 51 y.o.femalewith a medical history of plantar fasciitis presents with right sided lower back pain that radiates down the posterior RLE to the popliteal fossa. 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 Went to the ED eight days ago and was diagnosed with sciatica. Prescribed oxycodone 5-325 mg with temporary relief of pain. Has since run out and has been taking Naproxen 220 mg with little relief. Overall, pain has diminished somewhat but is still in need of pain relief. Pain is sharp and constant making it difficult to walk, sit or stand for a prolonged time. Rated at 8/10 at standstill, worse with movement. 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 Denies saddle paresthesia, urinary incontinence, fecal incontinence, or paralysis.      Outpatient Medications Prior to Visit  Medication Sig Dispense Refill  . ibuprofen (ADVIL,MOTRIN) 800 MG tablet Take 1 tablet (800 mg total) by mouth every 8 (eight) hours as needed. 45 tablet 0  . amoxicillin-clavulanate (AUGMENTIN) 875-125 MG tablet Take 1 tablet 2 (two) times daily by mouth. (Patient not taking: Reported on 03/29/2017) 20 tablet 0  . diclofenac (VOLTAREN) 75 MG EC tablet Take 1 tablet (75 mg total) 2 (two) times daily by mouth. (Patient not taking: Reported on 03/29/2017) 60 tablet 0  . Elastic Bandages & Supports (PLANTAR FASCIITIS ARCH SLEEVE) MISC 1 application by Does not apply route daily. (Patient not taking: Reported on 03/29/2017) 1 each 0  . oxyCODONE-acetaminophen (PERCOCET/ROXICET) 5-325 MG tablet Take 1 tablet by mouth every 4 (four) hours as needed for severe pain. (Patient not taking: Reported on 03/29/2017) 6 tablet 0   No facility-administered medications prior to visit.      ROS Review of Systems  Constitutional: Negative for chills, fever and  malaise/fatigue.  Eyes: Negative for blurred vision.  Respiratory: Negative for shortness of breath.   Cardiovascular: Negative for chest pain and palpitations.  Gastrointestinal: Negative for abdominal pain and nausea.  Genitourinary: Negative for dysuria and hematuria.  Musculoskeletal: Positive for back pain. Negative for joint pain and myalgias.  Skin: Negative for rash.  Neurological: Negative for tingling and headaches.       RLE paresthesia  Psychiatric/Behavioral: Negative for depression. The patient is not nervous/anxious.     Objective:  BP 118/71 (BP Location: Left Arm, Patient Position: Sitting, Cuff Size: Large)   Pulse 65   Temp 98 F (36.7 C) (Oral)   Wt 185 lb 3.2 oz (84 kg)   LMP 03/06/2017 (Exact Date)   SpO2 95%   BMI 36.17 kg/m   BP/Weight 03/29/2017 03/21/2017 73/06/2023  Systolic BP 427 062 376  Diastolic BP 71 97 78  Wt. (Lbs) 185.2 - 184.2  BMI 36.17 - 35.97      Physical Exam  Constitutional: She is oriented to person, place, and time.  Well developed, overweight, NAD, polite  HENT:  Head: Normocephalic and atraumatic.  Eyes: No scleral icterus.  Neck: Normal range of motion.  Cardiovascular: Normal rate, regular rhythm and normal heart sounds.  Pulmonary/Chest: Effort normal and breath sounds normal.  Musculoskeletal: She exhibits no edema.  TTP along the right iliosacral joint. No paraspinal rigidity. Moderately limited flexion and extension 2/2 pain.  Neurological: She is alert and oriented to person, place, and time.  Skin: Skin is warm and dry. No rash noted. No erythema. No  pallor.  Psychiatric: She has a normal mood and affect. Her behavior is normal. Thought content normal.  Vitals reviewed.    Assessment & Plan:   1. Sciatica of right side - Begin predniSONE (DELTASONE) 20 MG tablet; Take 1 tablet (20 mg total) by mouth daily with breakfast.  Dispense: 21 tablet; Refill: 0 - Begin HYDROcodone-acetaminophen (NORCO) 5-325 MG  tablet; Take 1 tablet by mouth every 6 (six) hours as needed for moderate pain.  Dispense: 20 tablet; Refill: 0 - Begin senna-docusate (SENOKOT-S) 8.6-50 MG tablet; Take 1 tablet by mouth 2 (two) times daily.  Dispense: 10 tablet; Refill: 0 - Stop Ibuprofen and Flanax    Meds ordered this encounter  Medications  . predniSONE (DELTASONE) 20 MG tablet    Sig: Take 1 tablet (20 mg total) by mouth daily with breakfast.    Dispense:  21 tablet    Refill:  0    Order Specific Question:   Supervising Provider    Answer:   Tresa Garter [id number]  . HYDROcodone-acetaminophen (NORCO) 5-325 MG tablet    Sig: Take 1 tablet by mouth every 6 (six) hours as needed for moderate pain.    Dispense:  20 tablet    Refill:  0    Order Specific Question:   Supervising Provider    Answer:   Tresa Garter [id number]  . senna-docusate (SENOKOT-S) 8.6-50 MG tablet    Sig: Take 1 tablet by mouth 2 (two) times daily.    Dispense:  10 tablet    Refill:  0    Order Specific Question:   Supervising Provider    Answer:   Tresa Garter [id number]    Follow-up: Return in about 5 days (around 04/03/2017).   Clent Demark PA

## 2017-04-03 ENCOUNTER — Encounter: Payer: Self-pay | Admitting: Podiatry

## 2017-04-03 ENCOUNTER — Ambulatory Visit: Payer: No Typology Code available for payment source | Admitting: Podiatry

## 2017-04-03 DIAGNOSIS — M659 Synovitis and tenosynovitis, unspecified: Secondary | ICD-10-CM

## 2017-04-03 DIAGNOSIS — M722 Plantar fascial fibromatosis: Secondary | ICD-10-CM

## 2017-04-03 DIAGNOSIS — M21619 Bunion of unspecified foot: Secondary | ICD-10-CM

## 2017-04-04 ENCOUNTER — Ambulatory Visit (INDEPENDENT_AMBULATORY_CARE_PROVIDER_SITE_OTHER): Payer: Self-pay | Admitting: Physician Assistant

## 2017-04-05 NOTE — Progress Notes (Signed)
   Subjective: 51 year old female presenting today for follow-up evaluation of right plantar fasciitis as well as bilateral bunions. She states that the plantar aspect of the right heel continues to hurt. She states she experienced some relief after receiving the injection.  She reports seeing another physician who discontinued the diclofenac. Patient presents today for further treatment and evaluation.   No past medical history on file.   Objective: Physical Exam General: The patient is alert and oriented x3 in no acute distress.  Dermatology: Skin is warm, dry and supple bilateral lower extremities. Negative for open lesions or macerations bilateral.   Vascular: Dorsalis Pedis and Posterior Tibial pulses palpable bilateral.  Capillary fill time is immediate to all digits.  Neurological: Epicritic and protective threshold intact bilateral.   Musculoskeletal: Tenderness to palpation at the medial calcaneal tubercale and through the insertion of the plantar fascia of the right foot. Clinical evidence of bunion deformity noted to the bilateral feet. There is a moderate pain on palpation range of motion of the first MPJ. Lateral deviation of the hallux noted consistent with hallux abductovalgus.  Pain with palpation to the anterior, medial, lateral aspects of the right ankle.  All other joints range of motion within normal limits bilateral. Strength 5/5 in all groups bilateral.   Assessment: 1. Plantar fasciitis right 2. 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  Right ankle synovitis  Plan of Care:  1. Patient evaluated.   2. Injection of 0.5cc Celestone soluspan injected into the right plantar fascia  3.  Injection of 0.5 cc Celestone Soluspan injected into the right ankle. 4.  We will discuss possible surgery for bunions and next visit. 5. Return to clinic in 6 weeks.     [name], DPM [facility]  Dr. [name], DPM    [address], [place] [address]                Office [phone]  Fax [phone]

## 2017-04-12 ENCOUNTER — Ambulatory Visit (INDEPENDENT_AMBULATORY_CARE_PROVIDER_SITE_OTHER): Payer: Self-pay | Admitting: Physician Assistant

## 2017-04-13 ENCOUNTER — Other Ambulatory Visit (HOSPITAL_COMMUNITY)
Admission: RE | Admit: 2017-04-13 | Discharge: 2017-04-13 | Disposition: A | Discharge status: Home / Self Care | Payer: Self-pay | Source: Ambulatory Visit | Attending: Physician Assistant | Admitting: Physician Assistant

## 2017-04-13 ENCOUNTER — Encounter (INDEPENDENT_AMBULATORY_CARE_PROVIDER_SITE_OTHER): Payer: Self-pay | Admitting: Physician Assistant

## 2017-04-13 ENCOUNTER — Ambulatory Visit (INDEPENDENT_AMBULATORY_CARE_PROVIDER_SITE_OTHER): Payer: Self-pay | Admitting: Physician Assistant

## 2017-04-13 ENCOUNTER — Other Ambulatory Visit: Payer: Self-pay

## 2017-04-13 VITALS — BP 125/80 | HR 65 | Temp 97.6°F | Ht 62.0 in | Wt 184.4 lb

## 2017-04-13 DIAGNOSIS — Z124 Encounter for screening for malignant neoplasm of cervix: Secondary | ICD-10-CM

## 2017-04-13 DIAGNOSIS — Z1231 Encounter for screening mammogram for malignant neoplasm of breast: Secondary | ICD-10-CM

## 2017-04-13 DIAGNOSIS — Z0000 Encounter for general adult medical examination without abnormal findings: Secondary | ICD-10-CM

## 2017-04-13 DIAGNOSIS — Z1239 Encounter for other screening for malignant neoplasm of breast: Secondary | ICD-10-CM

## 2017-04-13 DIAGNOSIS — Z114 Encounter for screening for human immunodeficiency virus [HIV]: Secondary | ICD-10-CM

## 2017-04-13 DIAGNOSIS — Z23 Encounter for immunization: Secondary | ICD-10-CM

## 2017-04-13 NOTE — Patient Instructions (Signed)
Influenza Virus Vaccine (Flucelvax) Qu es este medicamento? La VACUNA ANTIGRIPAL ayuda a disminuir el riesgo de contraer la influenza, tambin conocida como la gripe. La vacuna solo ayuda a protegerle contra algunas cepas de influenza. Este medicamento puede ser utilizado para otros usos; si tiene alguna pregunta consulte con su proveedor de atencin mdica o con su farmacutico. MARCAS COMUNES: FLUCELVAX Qu le debo informar a mi profesional de la salud antes de tomar este medicamento? Necesita saber si usted presenta alguno de los siguientes problemas o situaciones: -trastorno de sangrado como hemofilia -fiebre o infeccin -sndrome de Guillain-Barre u otros problemas neurolgicos -problemas del sistema inmunolgico -infeccin por el virus de la inmunodeficiencia humana (VIH) o SIDA -niveles bajos de plaquetas en la sangre -esclerosis mltiple -una reaccin alrgica o inusual a las vacunas antigripales, a otros medicamentos, alimentos, colorantes o conservantes -si est embarazada o buscando quedar embarazada -si est amamantando a un beb Cmo debo utilizar este medicamento? Esta vacuna se administra mediante inyeccin por va intramuscular. Lo administra un profesional de [employer]	[place]. Recibir una copia de informacin escrita sobre la vacuna antes de cada vacuna. Asegrese de leer este folleto cada vez cuidadosamente. Este folleto puede cambiar con frecuencia. Hable con su pediatra para informarse acerca del uso de este medicamento en nios. Puede requerir atencin especial. Sobredosis: Pngase en contacto inmediatamente con un centro toxicolgico o una sala de urgencia si usted cree que haya tomado demasiado medicamento. ATENCIN: [employer] es solo para usted. No comparta este medicamento con nadie. Qu sucede si me olvido de una dosis? No se aplica en este caso. Qu puede interactuar con este medicamento? -quimioterapia o radioterapia -medicamentos que suprimen el sistema  inmunolgico, tales como etanercept, anakinra, infliximab y adalimumab -medicamentos que tratan o previenen cogulos sanguneos, como warfarina -fenitona -medicamentos esteroideos, como la prednisona o la cortisona -teofilina -vacunas Puede ser que esta lista no menciona todas las posibles interacciones. Informe a su profesional de [employer]	[place] de [employer] productos a base de hierbas, medicamentos de [place] o suplementos nutritivos que est tomando. Si usted fuma, consume bebidas alcohlicas o si utiliza drogas ilegales, indqueselo tambin a su profesional de [employer]	[place]. Algunas sustancias pueden interactuar con su medicamento. A qu debo estar atento al usar [employer]? Informe a su mdico o a Barrister's clerk de la [employer] todos los efectos secundarios que persistan despus de 3 das. Llame a su proveedor de atencin mdica si se presentan sntomas inusuales dentro de las 6 semanas de recibir esta vacuna. Es posible que todava pueda contraer la gripe, pero la enfermedad no ser tan fuerte como normalmente. No puede contraer la gripe de esta vacuna. La vacuna antigripal no le protege contra resfros u otras enfermedades que pueden causar [place]. Debe vacunarse cada ao. Qu efectos secundarios puedo tener al [employer] este medicamento? Efectos secundarios que debe informar a su mdico o a Barrister's clerk de la salud tan pronto como sea posible: -reacciones alrgicas como erupcin cutnea, picazn o urticarias, hinchazn de la cara, labios o lengua Efectos secundarios que, por lo general, no requieren atencin mdica (debe informarlos a su mdico o a su profesional de la salud si persisten o si son molestos): -fiebre -dolor de cabeza -molestias y dolores musculares -dolor, sensibilidad, enrojecimiento o Estate agent de la inyeccin -cansancio Puede ser que esta lista no menciona todos los posibles efectos secundarios. Comunquese a su mdico por asesoramiento mdico [employer]. Usted puede informar los efectos secundarios a la FDA por telfono  al 1-800-FDA-1088. Dnde debo guardar mi medicina? Esta vacuna se administrar por un profesional de la salud en una [place], Engineer, mining, consultorio mdico u otro consultorio de un profesional de la salud. No se le suministrar esta vacuna para guardar en su domicilio. ATENCIN: Este folleto es un resumen. Puede ser que no cubra toda la posible informacin. Si usted tiene preguntas acerca de esta medicina, consulte con su mdico, su farmacutico o su profesional de Technical sales engineer.  2018 Elsevier/Gold Standard (2011-04-04 16:29:16)

## 2017-04-13 NOTE — Progress Notes (Signed)
Subjective:  Patient ID: Lori Morse, female    DOB: [birth year]  Age: 51 y.o. MRN: [id number]  CC: annual physical  HPI Malay Fantroy is a 51 y.o. female with a medical history of plantar fasciitis right foot and sciatica left side presents for an annual physical.  She is feeling generally well except for mild lateral right knee pain. Pain is mild, not attributed to fall or injury, has not taken anything for relief. Does not endorse CP, palpitations, SOB, HA, abdominal pain, f/c/n/v, rash, or GI/GU sxs.      Outpatient Medications Prior to Visit  Medication Sig Dispense Refill  . Elastic Bandages & Supports (PLANTAR FASCIITIS ARCH SLEEVE) MISC 1 application by Does not apply route daily. (Patient not taking: Reported on 03/29/2017) 1 each 0  . HYDROcodone-acetaminophen (NORCO) 5-325 MG tablet Take 1 tablet by mouth every 6 (six) hours as needed for moderate pain. 20 tablet 0  . ibuprofen (ADVIL,MOTRIN) 800 MG tablet Take 1 tablet (800 mg total) by mouth every 8 (eight) hours as needed. 45 tablet 0  . predniSONE (DELTASONE) 20 MG tablet Take 1 tablet (20 mg total) by mouth daily with breakfast. 21 tablet 0  . senna-docusate (SENOKOT-S) 8.6-50 MG tablet Take 1 tablet by mouth 2 (two) times daily. 10 tablet 0   No facility-administered medications prior to visit.      ROS Review of Systems  Constitutional: Negative for chills, fever and malaise/fatigue.  Eyes: Negative for blurred vision.  Respiratory: Negative for shortness of breath.   Cardiovascular: Negative for chest pain and palpitations.  Gastrointestinal: Negative for abdominal pain and nausea.  Genitourinary: Negative for dysuria and hematuria.  Musculoskeletal: Positive for joint pain. Negative for myalgias.  Skin: Negative for rash.  Neurological: Negative for tingling and headaches.  Psychiatric/Behavioral: Negative for depression. The patient is not nervous/anxious.     Objective:  BP 125/80 (BP  Location: Left Arm, Patient Position: Sitting, Cuff Size: Normal)   Pulse 65   Temp 97.6 F (36.4 C) (Oral)   Ht 5\' 2"  (1.575 m)   Wt 184 lb 6.4 oz (83.6 kg)   LMP 03/06/2017 (Exact Date)   SpO2 96%   BMI 33.73 kg/m   BP/Weight 04/13/2017 03/29/2017 40/01/2724  Systolic BP 366 440 347  Diastolic BP 80 71 97  Wt. (Lbs) 184.4 185.2 -  BMI 33.73 36.17 -      Physical Exam  Constitutional: She is oriented to person, place, and time.  Well developed, overweight, NAD, polite  HENT:  Head: Normocephalic and atraumatic.  Mouth/Throat: No oropharyngeal exudate.  Eyes: Conjunctivae and EOM are normal. No scleral icterus.  Neck: Normal range of motion. Neck supple. No thyromegaly present.  Cardiovascular: Normal rate, regular rhythm and normal heart sounds. Exam reveals no gallop and no friction rub.  No murmur heard. Pulmonary/Chest: Effort normal and breath sounds normal. No respiratory distress. She has no wheezes. She has no rales.  Abdominal: Soft. Bowel sounds are normal. She exhibits no distension and no mass. There is no tenderness. There is no rebound and no guarding.  Genitourinary:  Genitourinary Comments: Vagina walls mildly atrophic, cervix mildly erythematous, no adnexal mass/tenderness bilaterally, and no uterine mass/tenderness.  Musculoskeletal: She exhibits no edema.  Lymphadenopathy:    She has no cervical adenopathy.  Neurological: She is alert and oriented to person, place, and time. No cranial nerve deficit. Coordination normal.  Skin: Skin is warm and dry. No rash noted. No erythema. No pallor.  Psychiatric:  She has a normal mood and affect. Her behavior is normal. Thought content normal.  Vitals reviewed.    Assessment & Plan:    1. Annual physical exam - CBC with Differential - Comprehensive metabolic panel - Lipid panel  2. Screening for cervical cancer - Cytology - PAP [place]  3. Screening for breast cancer - [place] DIGITAL SCREENING BILATERAL;  Future  4. Screening for HIV (human immunodeficiency virus) - HIV antibody  5. Need for prophylactic vaccination and inoculation against influenza - Flu Vaccine QUAD 6+ mos PF IM (Fluarix Quad PF); Future - We are out of stock. Advised patient to go to [facility] for vaccination    Follow-up: Return if symptoms worsen or fail to improve.   Clent Demark PA

## 2017-04-14 LAB — LIPID PANEL
CHOLESTEROL TOTAL: 211 mg/dL — AB (ref 100–199)
Chol/HDL Ratio: 3.8 ratio (ref 0.0–4.4)
HDL: 56 mg/dL (ref >39)
LDL Calculated: 113 mg/dL — ABNORMAL HIGH (ref 0–99)
Triglycerides: 212 mg/dL — ABNORMAL HIGH (ref 0–149)
VLDL Cholesterol Cal: 42 mg/dL — ABNORMAL HIGH (ref 5–40)

## 2017-04-14 LAB — COMPREHENSIVE METABOLIC PANEL
ALK PHOS: 93 IU/L (ref 39–117)
ALT: 29 IU/L (ref 0–32)
AST: 26 IU/L (ref 0–40)
Albumin/Globulin Ratio: 1.3 (ref 1.2–2.2)
Albumin: 4.1 g/dL (ref 3.5–5.5)
BILIRUBIN TOTAL: 0.3 mg/dL (ref 0.0–1.2)
BUN/Creatinine Ratio: 16 (ref 9–23)
BUN: 11 mg/dL (ref 6–24)
CHLORIDE: 102 mmol/L (ref 96–106)
CO2: 25 mmol/L (ref 20–29)
Calcium: 9.4 mg/dL (ref 8.7–10.2)
Creatinine, Ser: 0.7 mg/dL (ref 0.57–1.00)
GFR calc Af Amer: 118 mL/min/{1.73_m2} (ref >59)
GFR calc non Af Amer: 102 mL/min/{1.73_m2} (ref >59)
GLOBULIN, TOTAL: 3.2 g/dL (ref 1.5–4.5)
GLUCOSE: 87 mg/dL (ref 65–99)
Potassium: 4.1 mmol/L (ref 3.5–5.2)
SODIUM: 139 mmol/L (ref 134–144)
Total Protein: 7.3 g/dL (ref 6.0–8.5)

## 2017-04-14 LAB — CBC WITH DIFFERENTIAL/PLATELET
BASOS ABS: 0 10*3/uL (ref 0.0–0.2)
Basos: 0 %
EOS (ABSOLUTE): 0.1 10*3/uL (ref 0.0–0.4)
Eos: 2 %
HEMATOCRIT: 43 % (ref 34.0–46.6)
Hemoglobin: 14.2 g/dL (ref 11.1–15.9)
Immature Grans (Abs): 0 10*3/uL (ref 0.0–0.1)
Immature Granulocytes: 0 %
LYMPHS ABS: 2.2 10*3/uL (ref 0.7–3.1)
Lymphs: 32 %
MCH: 30.8 pg (ref 26.6–33.0)
MCHC: 33 g/dL (ref 31.5–35.7)
MCV: 93 fL (ref 79–97)
Monocytes Absolute: 0.6 10*3/uL (ref 0.1–0.9)
Monocytes: 8 %
NEUTROS ABS: 4 10*3/uL (ref 1.4–7.0)
Neutrophils: 58 %
Platelets: 329 10*3/uL (ref 150–379)
RBC: 4.61 x10E6/uL (ref 3.77–5.28)
RDW: 14 % (ref 12.3–15.4)
WBC: 6.9 10*3/uL (ref 3.4–10.8)

## 2017-04-14 LAB — HIV ANTIBODY (ROUTINE TESTING W REFLEX): HIV Screen 4th Generation wRfx: NONREACTIVE

## 2017-04-17 LAB — CYTOLOGY - PAP
BACTERIAL VAGINITIS: NEGATIVE
CHLAMYDIA, DNA PROBE: NEGATIVE
Candida vaginitis: NEGATIVE
Diagnosis: NEGATIVE
Neisseria Gonorrhea: NEGATIVE
Trichomonas: NEGATIVE

## 2017-04-18 ENCOUNTER — Other Ambulatory Visit (INDEPENDENT_AMBULATORY_CARE_PROVIDER_SITE_OTHER): Payer: Self-pay | Admitting: Physician Assistant

## 2017-04-18 ENCOUNTER — Telehealth (INDEPENDENT_AMBULATORY_CARE_PROVIDER_SITE_OTHER): Payer: Self-pay | Admitting: Physician Assistant

## 2017-04-18 DIAGNOSIS — E785 Hyperlipidemia, unspecified: Secondary | ICD-10-CM

## 2017-04-18 MED ORDER — LOVASTATIN 20 MG PO TABS: 20.0000 mg | ORAL_TABLET | Freq: Every day | ORAL | 3 refills | Status: DC

## 2017-04-18 NOTE — Telephone Encounter (Signed)
Will call patient when results are available. Tempestt S Roberts, CMA  

## 2017-05-16 ENCOUNTER — Ambulatory Visit
Admission: RE | Admit: 2017-05-16 | Discharge: 2017-05-16 | Disposition: A | Discharge status: Home / Self Care | Payer: No Typology Code available for payment source | Source: Ambulatory Visit | Attending: Physician Assistant | Admitting: Physician Assistant

## 2017-05-16 DIAGNOSIS — Z1239 Encounter for other screening for malignant neoplasm of breast: Secondary | ICD-10-CM

## 2017-09-13 ENCOUNTER — Telehealth (INDEPENDENT_AMBULATORY_CARE_PROVIDER_SITE_OTHER): Payer: Self-pay | Admitting: Physician Assistant

## 2017-09-13 NOTE — Telephone Encounter (Signed)
FWD to covering provider at [facility]. Tempestt S Roberts, CMA  

## 2017-09-13 NOTE — Telephone Encounter (Signed)
Patient called requesting  ibuprofen (ADVIL,MOTRIN) 800 MG tablet  Send it to [facility]

## 2017-09-18 ENCOUNTER — Other Ambulatory Visit: Payer: Self-pay | Admitting: Nurse Practitioner

## 2017-09-18 DIAGNOSIS — M7731 Calcaneal spur, right foot: Secondary | ICD-10-CM

## 2017-09-18 MED ORDER — IBUPROFEN 800 MG PO TABS: 800.0000 mg | ORAL_TABLET | Freq: Three times a day (TID) | ORAL | 0 refills | Status: DC | PRN

## 2017-09-18 NOTE — Telephone Encounter (Signed)
Approved. Sent.

## 2017-10-09 ENCOUNTER — Ambulatory Visit: Payer: No Typology Code available for payment source

## 2017-10-10 ENCOUNTER — Ambulatory Visit (INDEPENDENT_AMBULATORY_CARE_PROVIDER_SITE_OTHER): Payer: Self-pay | Admitting: Physician Assistant

## 2017-10-10 ENCOUNTER — Other Ambulatory Visit: Payer: Self-pay

## 2017-10-10 ENCOUNTER — Encounter (INDEPENDENT_AMBULATORY_CARE_PROVIDER_SITE_OTHER): Payer: Self-pay | Admitting: Physician Assistant

## 2017-10-10 VITALS — BP 117/77 | HR 5 | Temp 98.3°F | Ht 61.0 in | Wt 191.2 lb

## 2017-10-10 DIAGNOSIS — M25561 Pain in right knee: Secondary | ICD-10-CM

## 2017-10-10 DIAGNOSIS — Z1211 Encounter for screening for malignant neoplasm of colon: Secondary | ICD-10-CM

## 2017-10-10 DIAGNOSIS — M7731 Calcaneal spur, right foot: Secondary | ICD-10-CM

## 2017-10-10 MED ORDER — TRAMADOL HCL 50 MG PO TABS: 50.0000 mg | ORAL_TABLET | Freq: Two times a day (BID) | ORAL | 0 refills | Status: DC | PRN

## 2017-10-10 MED ORDER — NAPROXEN 500 MG PO TABS: 500.0000 mg | ORAL_TABLET | Freq: Two times a day (BID) | ORAL | 1 refills | Status: DC

## 2017-10-10 MED FILL — NAPROXEN 500 MG TABLET: 500 | 30 days supply | Qty: 60 | Fill #0

## 2017-10-10 NOTE — Progress Notes (Signed)
Subjective:  Patient ID: Lori Morse, female    DOB: 1965/07/02  Age: 52 y.o. MRN: [id number]  CC: heel pain  HPI  Lori Morse is a 52 y.o. female with a medical history of plantar fasciitis right foot and sciatica left side presents with severe right heel pain. Previously advised to wear plantar fasciitis foot sleep support, wear plantar fasciitis insoles, take NSAIDs, and perform roller massages on sole of foot which were of minimal relief. Imagaing subsequently ordered and found to have a prominent plantar spur. Could not be referred to podiatrist due to lack of funds and not having applied for CAFA.     Patient also has right knee pain due to a fall approximately 15 weeks ago. Has taken OTC NSAIDs and applied aloe vera with reduction in swelling but not in pain. Pain is described as moderate. Says she can not rest because she needs to work and pay the bills. Does not endorse any other symptoms or complaints.     Outpatient Medications Prior to Visit  Medication Sig Dispense Refill  . Elastic Bandages & Supports (PLANTAR FASCIITIS ARCH SLEEVE) MISC 1 application by Does not apply route daily. (Patient not taking: Reported on 03/29/2017) 1 each 0  . HYDROcodone-acetaminophen (NORCO) 5-325 MG tablet Take 1 tablet by mouth every 6 (six) hours as needed for moderate pain. 20 tablet 0  . ibuprofen (ADVIL,MOTRIN) 800 MG tablet Take 1 tablet (800 mg total) by mouth every 8 (eight) hours as needed. 45 tablet 0  . lovastatin (MEVACOR) 20 MG tablet Take 1 tablet (20 mg total) by mouth at bedtime. 90 tablet 3  . predniSONE (DELTASONE) 20 MG tablet Take 1 tablet (20 mg total) by mouth daily with breakfast. 21 tablet 0  . senna-docusate (SENOKOT-S) 8.6-50 MG tablet Take 1 tablet by mouth 2 (two) times daily. 10 tablet 0   No facility-administered medications prior to visit.      ROS Review of Systems  Constitutional: Negative for chills, fever and malaise/fatigue.  Eyes: Negative  for blurred vision.  Respiratory: Negative for shortness of breath.   Cardiovascular: Negative for chest pain and palpitations.  Gastrointestinal: Negative for abdominal pain and nausea.  Genitourinary: Negative for dysuria and hematuria.  Musculoskeletal: Negative for joint pain and myalgias.  Skin: Negative for rash.  Neurological: Negative for tingling and headaches.  Psychiatric/Behavioral: Negative for depression. The patient is not nervous/anxious.     Objective:    BP/Weight 04/13/2017 03/29/2017 40/98/1191  Systolic BP 478 295 621  Diastolic BP 80 71 97  Wt. (Lbs) 184.4 185.2 -  BMI 33.73 36.17 -      Physical Exam  Constitutional: She is oriented to person, place, and time.  Well developed, well nourished, NAD, polite  HENT:  Head: Normocephalic and atraumatic.  Eyes: No scleral icterus.  Neck: Normal range of motion. Neck supple. No thyromegaly present.  Cardiovascular: Normal rate, regular rhythm and normal heart sounds.  Pulmonary/Chest: Effort normal and breath sounds normal.  Musculoskeletal: She exhibits tenderness (mild and generalized right knee pain, no edema, no erythema, no ecchymosis.). She exhibits no edema or deformity.  Neurological: She is alert and oriented to person, place, and time.  Mild antalgic gait favoring the right side.  Skin: Skin is warm and dry. No rash noted. No erythema. No pallor.  Small and healing abrasion anteriorly on right knee  Psychiatric: She has a normal mood and affect. Her behavior is normal. Thought content normal.  Vitals reviewed.  Assessment & Plan:   1. Acute pain of right knee - Recommended RICE - Begin Tramadol - Begin Naproxen  2. Calcaneal spur of right foot - Ambulatory referral to Podiatry - Begin Tramadol - Begin Naproxen  3. Special screening for malignant neoplasms, colon - Fecal occult blood, imunochemical   Meds ordered this encounter  Medications  . traMADol (ULTRAM) 50 MG tablet     Sig: Take 1 tablet (50 mg total) by mouth every 12 (twelve) hours as needed.    Dispense:  40 tablet    Refill:  0    Order Specific Question:   Supervising Provider    Answer:   Charlott Rakes [[id number]]  . naproxen (NAPROSYN) 500 MG tablet    Sig: Take 1 tablet (500 mg total) by mouth 2 (two) times daily with a meal.    Dispense:  60 tablet    Refill:  1    Order Specific Question:   Supervising Provider    Answer:   Charlott Rakes [[id number]]    Follow-up: Return if symptoms worsen or fail to improve.   Clent Demark PA

## 2017-10-10 NOTE — Patient Instructions (Signed)
Dolor de Aeronautical engineer adultos Knee Pain, Adult El dolor de rodilla es un problema frecuente en los adultos. Puede tener muchas causas, entre ellas:  Artritis.  Un saco lleno de lquido (quiste) o un crecimiento en la rodilla.  Una infeccin en la rodilla.  Una lesin que no se [place].  Dao, hinchazn o irritacin de los tejidos que sostienen la rodilla.  Por lo general, el dolor de rodilla no es un signo de un problema grave. El Garment/textile technologist solo con el tiempo y el descanso. De no ser as, un mdico podra indicarle que se haga estudios para hallar la causa del dolor. Estos pueden incluir lo siguiente:  Estudios de diagnstico por imgenes, como una radiografa, una resonancia magntica (RM) o una ecografa.  Aspiracin de una articulacin. En [employer], se extrae lquido de la rodilla.  Artroscopia. En este estudio, un tubo iluminado se introduce en la rodilla, y se proyecta una imagen en una pantalla.  Biopsia. En [employer], se toma [place] de tejido del organismo y se la estudia con un microscopio.  Siga estas instrucciones en su casa: Est atento a cualquier cambio en los sntomas. Tome estas medidas para Best boy. Actividad  Ponga la rodilla en reposo.  No haga cosas que le causen dolor o que lo intensifiquen.  Evite las actividades o los ejercicios de alto impacto, como correr, Art therapist soga o hacer saltos de tijera. Instrucciones generales  [employer] de venta libre y los recetados solamente como se lo haya indicado el mdico.  Cuando est sentado o acostado, levante (eleve) la rodilla por encima del nivel del corazn.  Duerma con una almohada debajo de la rodilla.  Si se lo indicaron, colquese hielo sobre la rodilla: ? Ponga el hielo en una bolsa plstica. ? Coloque una [employer] piel y la bolsa de hielo. ? Coloque el hielo durante 65minutos, 2 o 3veces por da.  Pregntele al mdico si debe usar una Engineer, manufacturing systems.  Baje de peso si es necesario. El exceso de peso puede generar presin en la rodilla.  No consuma ningn producto que contenga nicotina o tabaco, como cigarrillos y Psychologist, sport and exercise. Fumar puede retrasar la curacin de cualquier problema que tenga en el hueso y la articulacin. Si necesita ayuda para dejar de fumar, consulte al [employer]. Comunquese con un mdico si:  El dolor de rodilla contina, [place].  Tiene fiebre junto con dolor de rodilla.  La rodilla se le tuerce o se le traba.  La rodilla se hincha, y la hinchazn empeora. Solicite ayuda de inmediato si:  La rodilla est caliente al tacto.  No puede mover la rodilla.  Siente un dolor intenso en la rodilla.  Siente dolor en el pecho.  Tiene dificultad para respirar. Resumen  El dolor de rodilla es un problema frecuente en los adultos. Puede ser consecuencia de muchas cosas, entre ellas, artritis, infecciones, quistes o lesiones.  Por lo general, el dolor de rodilla no es un signo de un problema grave; sin embargo, si no desaparece, un mdico podra indicarle que se realice algunos estudios para hallar la causa del dolor.  Est atento a cualquier cambio en los sntomas. [employer] dolor con descanso, medicamentos, actividad de poca intensidad y [place] de hielo.  Procure ayuda si el dolor contina o se intensifica demasiado, si la rodilla se tuerce o se traba, o si tiene dolor en el pecho o dificultad para respirar. Esta informacin no  tiene Marine scientist el consejo del mdico. Asegrese de hacerle al mdico cualquier pregunta que tenga. Document Released: 10/05/2007 Document Revised: 08/25/2016 Document Reviewed: 12/02/2013 Elsevier Interactive Patient Education  2018 [employer].

## 2017-10-11 MED FILL — traMADol HCL 50 MG TABS: 50 | 20 days supply | Qty: 40 | Fill #0

## 2017-10-13 ENCOUNTER — Telehealth (INDEPENDENT_AMBULATORY_CARE_PROVIDER_SITE_OTHER): Payer: Self-pay

## 2017-10-13 LAB — FECAL OCCULT BLOOD, IMMUNOCHEMICAL: Fecal Occult Bld: NEGATIVE

## 2017-10-13 NOTE — Telephone Encounter (Signed)
[place] interpreter Mila Merry [phone]) left message notifying patient that FIT (stool sample) was negative. Call [facility] with any questions. Nat Christen, CMA

## 2017-10-13 NOTE — Telephone Encounter (Signed)
-----   Message from Clent Demark, PA-C sent at 10/13/2017  8:52 AM EDT ----- Negative FIT.

## 2017-10-19 ENCOUNTER — Other Ambulatory Visit: Payer: Self-pay

## 2017-10-19 ENCOUNTER — Encounter (INDEPENDENT_AMBULATORY_CARE_PROVIDER_SITE_OTHER): Payer: Self-pay | Admitting: Physician Assistant

## 2017-10-19 ENCOUNTER — Ambulatory Visit (INDEPENDENT_AMBULATORY_CARE_PROVIDER_SITE_OTHER): Payer: No Typology Code available for payment source | Admitting: Physician Assistant

## 2017-10-19 VITALS — BP 111/67 | HR 61 | Temp 98.0°F | Ht 61.0 in | Wt 190.0 lb

## 2017-10-19 DIAGNOSIS — T7840XA Allergy, unspecified, initial encounter: Secondary | ICD-10-CM

## 2017-10-19 MED ORDER — HYDROXYZINE HCL 10 MG PO TABS: 10.0000 mg | ORAL_TABLET | Freq: Two times a day (BID) | ORAL | 0 refills | Status: DC | PRN

## 2017-10-19 NOTE — Patient Instructions (Signed)
Alergias (Allergies) La alergia ocurre cuando el cuerpo reacciona a una sustancia de un modo que no es normal. Una reaccin alrgica puede producirse despus de cualquiera de estas acciones:  Comer algo.  Inhalar algo.  Tocar algo. CULES SON LAS CLASES DE ALERGIAS? Se puede ser alrgico a lo siguiente:  Cosas que surgen solo durante ciertas temporadas, como moho y [place].  Alimentos.  Medicamentos.  Insectos.  Caspa de los [place]. CULES SON LOS SNTOMAS DE LAS ALERGIAS?  Hinchazn. Puede aparecer en los labios, la cara, la lengua, la boca o la garganta.  Estornudos.  Tos.  Respiracin ruidosa (sibilancias).  Nariz tapada.  Hormigueo en la boca.  Una erupcin.  Picazn.  Zonas de piel hinchadas, rojas y que producen picazn (ronchas).  Lagrimeo.  Vmitos.  Deposiciones lquidas (diarrea).  Mareos.  Desmayo o sensacin de desvanecimiento.  Problemas para respirar o tragar.  Sensacin de opresin en el pecho.  Latidos cardacos acelerados. CMO SE DIAGNOSTICAN LAS ALERGIAS? Las alergias pueden diagnosticarse mediante lo siguiente:  Antecedentes mdicos y familiares.  Pruebas cutneas.  Anlisis de [place].  Un registro de alimentos. Un registro de [employer], las bebidas y los sntomas diarios.  Los resultados de una dieta de eliminacin. Esta dieta implica asegurarse de no comer determinados alimentos y [employer] ver qu sucede cuando comienza a comerlos de nuevo. CMO SE TRATAN LAS ALERGIAS? No hay una cura para las alergias, pero las reacciones alrgicas pueden tratarse con medicamentos. Generalmente, las reacciones graves deben tratarse en un hospital. [place]? La mejor manera de prevenir una reaccin alrgica es evitar el elemento que le causa Buyer, retail. Las vacunas y los medicamentos para la alergia tambin pueden ayudar a prevenir las reacciones en [employer]. Esta informacin no  tiene Marine scientist el consejo del mdico. Asegrese de hacerle al mdico cualquier pregunta que tenga. Document Released: 12/19/2012 Document Revised: 05/09/2014 Document Reviewed: 01/28/2014 Elsevier Interactive Patient Education  [employer].

## 2017-10-19 NOTE — Progress Notes (Signed)
Subjective:  Patient ID: Lori Morse, female    DOB: 09-May-1965  Age: 52 y.o. MRN: [id number]  CC: facial swelling and itching  HPI  Lori Morse a 52 y.o.femalewith a medical history of plantar fasciitis right foot and sciatica left side presents with under eye swelling and itching since yesterday morning. No visual impairment. Took Benadryl with resolution of symptoms. Can not think of any changes in detergent, soap, perfume, or lotions. No f/c/n/v, rash elsewhere, swelling elsewhere, CP, palpitations, SOB, abdominal pain, or GI/GU sxs.    Outpatient Medications Prior to Visit  Medication Sig Dispense Refill  . HYDROcodone-acetaminophen (NORCO) 5-325 MG tablet Take 1 tablet by mouth every 6 (six) hours as needed for moderate pain. 20 tablet 0  . ibuprofen (ADVIL,MOTRIN) 800 MG tablet Take 1 tablet (800 mg total) by mouth every 8 (eight) hours as needed. 45 tablet 0  . lovastatin (MEVACOR) 20 MG tablet Take 1 tablet (20 mg total) by mouth at bedtime. 90 tablet 3  . naproxen (NAPROSYN) 500 MG tablet Take 1 tablet (500 mg total) by mouth 2 (two) times daily with a meal. 60 tablet 1  . predniSONE (DELTASONE) 20 MG tablet Take 1 tablet (20 mg total) by mouth daily with breakfast. 21 tablet 0  . senna-docusate (SENOKOT-S) 8.6-50 MG tablet Take 1 tablet by mouth 2 (two) times daily. 10 tablet 0  . traMADol (ULTRAM) 50 MG tablet Take 1 tablet (50 mg total) by mouth every 12 (twelve) hours as needed. 40 tablet 0   No facility-administered medications prior to visit.      ROS Review of Systems  Constitutional: Negative for chills, fever and malaise/fatigue.  Eyes: Negative for blurred vision.  Respiratory: Negative for shortness of breath.   Cardiovascular: Negative for chest pain and palpitations.  Gastrointestinal: Negative for abdominal pain and nausea.  Genitourinary: Negative for dysuria and hematuria.  Musculoskeletal: Negative for joint pain and myalgias.  Skin:  Positive for itching. Negative for rash.  Neurological: Negative for tingling and headaches.  Psychiatric/Behavioral: Negative for depression. The patient is not nervous/anxious.     Objective:  BP 111/67 (BP Location: Left Arm, Patient Position: Sitting, Cuff Size: Large)   Pulse 61   Temp 98 F (36.7 C) (Oral)   Ht 5\' 1"  (1.549 m)   Wt 190 lb (86.2 kg)   LMP 09/25/2017 (Exact Date)   SpO2 96%   BMI 35.90 kg/m   BP/Weight 10/19/2017 10/10/2017 77/41/2878  Systolic BP 676 720 947  Diastolic BP 67 77 80  Wt. (Lbs) 190 191.2 184.4  BMI 35.9 36.13 33.73      Physical Exam  Constitutional: She is oriented to person, place, and time.  Well developed, well nourished, NAD, polite  HENT:  Head: Normocephalic and atraumatic.  Eyes: No scleral icterus.  Neck: Normal range of motion. Neck supple. No thyromegaly present.  Cardiovascular: Normal rate, regular rhythm and normal heart sounds.  Pulmonary/Chest: Effort normal and breath sounds normal.  Musculoskeletal: She exhibits no edema.  Neurological: She is alert and oriented to person, place, and time.  Skin: Skin is warm and dry. No rash noted. No erythema. No pallor.  Psychiatric: She has a normal mood and affect. Her behavior is normal. Thought content normal.  Vitals reviewed.    Assessment & Plan:    1. Allergic reaction, initial encounter - hydrOXYzine (ATARAX/VISTARIL) 10 MG tablet; Take 1 tablet (10 mg total) by mouth 2 (two) times daily as needed.  Dispense: 30 tablet;  Refill: 0   Meds ordered this encounter  Medications  . hydrOXYzine (ATARAX/VISTARIL) 10 MG tablet    Sig: Take 1 tablet (10 mg total) by mouth 2 (two) times daily as needed.    Dispense:  30 tablet    Refill:  0    Order Specific Question:   Supervising Provider    Answer:   Charlott Rakes [[id number]]    Follow-up: Return if symptoms worsen or fail to improve.   Clent Demark PA

## 2017-11-07 ENCOUNTER — Ambulatory Visit (INDEPENDENT_AMBULATORY_CARE_PROVIDER_SITE_OTHER): Payer: Self-pay | Admitting: Physician Assistant

## 2017-11-07 ENCOUNTER — Encounter (INDEPENDENT_AMBULATORY_CARE_PROVIDER_SITE_OTHER): Payer: Self-pay | Admitting: Physician Assistant

## 2017-11-07 ENCOUNTER — Other Ambulatory Visit: Payer: Self-pay

## 2017-11-07 VITALS — BP 112/67 | HR 58 | Temp 98.2°F | Ht 62.0 in | Wt 189.0 lb

## 2017-11-07 DIAGNOSIS — M21611 Bunion of right foot: Secondary | ICD-10-CM

## 2017-11-07 DIAGNOSIS — M21612 Bunion of left foot: Secondary | ICD-10-CM

## 2017-11-07 DIAGNOSIS — E7841 Elevated Lipoprotein(a): Secondary | ICD-10-CM

## 2017-11-07 DIAGNOSIS — M722 Plantar fascial fibromatosis: Secondary | ICD-10-CM

## 2017-11-07 MED ORDER — TRAMADOL HCL 50 MG PO TABS: 50.0000 mg | ORAL_TABLET | Freq: Two times a day (BID) | ORAL | 0 refills | Status: DC | PRN

## 2017-11-07 MED ORDER — NAPROXEN 500 MG PO TABS: 500.0000 mg | ORAL_TABLET | Freq: Two times a day (BID) | ORAL | 1 refills | Status: DC

## 2017-11-07 NOTE — Patient Instructions (Signed)
Fascitis plantar  (Plantar Fasciitis)  La fascitis plantar es una afeccin dolorosa que se produce en el taln. Ocurre cuando la banda de tejido que conecta los dedos con el hueso del taln (fascia plantar) se irrita. Esto puede ocurrir despus de hacer mucho ejercicio u otras actividades repetitivas (lesin por uso excesivo). El dolor de la fascitis plantar puede ser de leve (irritacin) a intenso, y en los casos ms agudos puede dificultar que la persona camine o se mueva. Por lo general, el dolor es peor a la maana o despus de permanecer sentado o acostado durante un perodo.  CAUSAS  Este trastorno puede ser causado por:   Estar de pie durante largos perodos.   Usar zapatos que no calcen bien.   Practicar actividades de alto impacto, como correr, o hacer ejercicios aerbicos o ballet.   Tener sobrepeso.   Tener una forma de caminar (marcha) anormal.   Tener los msculos de la pantorrilla tensos.   Tener el arco alto en los pies.   Comenzar una nueva actividad fsica.  SNTOMAS  El sntoma principal de esta afeccin es el dolor en el taln. Otros sntomas pueden ser los siguientes:   Dolor que empeora despus de una actividad o un ejercicio.   Dolor ms intenso a la maana o despus de descansar.   Dolor que desaparece despus de caminar durante unos minutos.  DIAGNSTICO  Esta afeccin se puede diagnosticar en funcin de los signos y los sntomas. El mdico tambin le realizar un examen fsico para controlar si tiene lo siguiente:   Un rea dolorida en la parte inferior del pie.   El arco alto.   Dolor al mover el pie.   Dificultad para mover el pie.  Tambin puede necesitar estudios por imgenes para confirmar el diagnstico. Estos pueden incluir los siguientes:   Radiografas.   Ecografa.   Resonancia magntica.  TRATAMIENTO  El tratamiento de la fascitis plantar depende de la gravedad de la afeccin. El tratamiento puede incluir lo siguiente:   Reposo, hielo y analgsicos de venta  libre para controlar el dolor.   Ejercicios para estirar las pantorrillas y la fascia plantar.   Una frula que mantiene el pie estirado y hacia arriba mientras usted duerme (frula nocturna).   Fisioterapia para aliviar los sntomas y evitar problemas en el futuro.   Inyecciones de cortisona para aliviar el dolor intenso.   Tratamiento con ondas de choque extracorpreas para estimular con impulsos elctricos la fascia plantar lesionada. Esto suele usarse como un ltimo recurso antes de la ciruga.   Ciruga, si los otros tratamientos no han funcionado despus de 12meses.  INSTRUCCIONES PARA EL CUIDADO EN EL HOGAR   Tome los medicamentos solamente como se lo haya indicado el mdico.   Evite las actividades que causan dolor.   Frote la parte inferior del pie sobre una bolsa de hielo o una botella de agua fra. Haga esto durante 20minutos, de 3a 4veces al da.   Realice estiramientos simples como se lo haya indicado el mdico.   Trate de usar calzado deportivo con amortiguacin de aire o gel, o plantillas blandas.   Si el mdico se lo ha indicado, use una frula nocturna para dormir.   Cumpla con todas las visitas de control, segn le indique su mdico.  PREVENCIN   No realice ejercicios ni actividades que le causen dolor en el taln.   Considere la posibilidad de empezar actividades de bajo impacto si sigue teniendo problemas.   Pierda peso si   lo necesita.  La mejor forma de prevenir la fascitis plantar es evitar las actividades que lesionan ms la fascia plantar.  SOLICITE ATENCIN MDICA SI:   Los sntomas no desaparecen despus del tratamiento en su casa.   El dolor empeora.   El dolor afecta la capacidad de moverse o de realizar las actividades diarias.  Esta informacin no tiene como fin reemplazar el consejo del mdico. Asegrese de hacerle al mdico cualquier pregunta que tenga.  Document Released: 01/26/2005 Document Revised: 08/10/2015 Document Reviewed: 02/26/2014  Elsevier  Interactive Patient Education  2018 Elsevier Inc.

## 2017-11-07 NOTE — Progress Notes (Signed)
Subjective:  Patient ID: Lori Morse, female    DOB: 1965/07/14  Age: 52 y.o. MRN: [id number]  CC:   HPI Lori Morse [facility] a 52 y.o.femalewith a medical history of plantar fasciitis right foot and sciatica left side presents on f/u of plantar fasciitis. Still has pain in the right heel and is now worsening. Pain 10/10, sharp in character, and is now associated with "burning" sensation. Had two previous injections into the heel at podiatry office. Was offered surgery of the plantar fascia but pt's CAFA expired. Pt has recently received reinstatement of her CAFA and is ready to go back to podiatry. She feels "bad" in that she is not able to work more than 2-3 hours per day. She wants to go back to full time work as soon as possible.      Outpatient Medications Prior to Visit  Medication Sig Dispense Refill  . lovastatin (MEVACOR) 20 MG tablet Take 1 tablet (20 mg total) by mouth at bedtime. 90 tablet 3  . naproxen (NAPROSYN) 500 MG tablet Take 1 tablet (500 mg total) by mouth 2 (two) times daily with a meal. 60 tablet 1  . traMADol (ULTRAM) 50 MG tablet Take 1 tablet (50 mg total) by mouth every 12 (twelve) hours as needed. 40 tablet 0  . hydrOXYzine (ATARAX/VISTARIL) 10 MG tablet Take 1 tablet (10 mg total) by mouth 2 (two) times daily as needed. (Patient not taking: Reported on 11/07/2017) 30 tablet 0  . ibuprofen (ADVIL,MOTRIN) 800 MG tablet Take 1 tablet (800 mg total) by mouth every 8 (eight) hours as needed. (Patient not taking: Reported on 11/07/2017) 45 tablet 0  . HYDROcodone-acetaminophen (NORCO) 5-325 MG tablet Take 1 tablet by mouth every 6 (six) hours as needed for moderate pain. 20 tablet 0  . predniSONE (DELTASONE) 20 MG tablet Take 1 tablet (20 mg total) by mouth daily with breakfast. 21 tablet 0  . senna-docusate (SENOKOT-S) 8.6-50 MG tablet Take 1 tablet by mouth 2 (two) times daily. 10 tablet 0   No facility-administered medications prior to visit.       ROS Review of Systems  Constitutional: Negative for chills, fever and malaise/fatigue.  Eyes: Negative for blurred vision.  Respiratory: Negative for shortness of breath.   Cardiovascular: Negative for chest pain and palpitations.  Gastrointestinal: Negative for abdominal pain and nausea.  Genitourinary: Negative for dysuria and hematuria.  Musculoskeletal: Negative for joint pain and myalgias.       Bilateral foot pain   Skin: Negative for rash.  Neurological: Negative for tingling and headaches.  Psychiatric/Behavioral: Negative for depression. The patient is not nervous/anxious.     Objective:  BP 112/67 (BP Location: Left Arm, Patient Position: Sitting, Cuff Size: Normal)   Pulse (!) 58   Temp 98.2 F (36.8 C) (Oral)   Ht 5\' 2"  (1.575 m)   Wt 189 lb (85.7 kg)   LMP 10/19/2017 (Approximate)   SpO2 96%   BMI 34.57 kg/m   BP/Weight 11/07/2017 10/19/2017 7/65/4650  Systolic BP 354 656 812  Diastolic BP 67 67 77  Wt. (Lbs) 189 190 191.2  BMI 34.57 35.9 36.13      Physical Exam  Constitutional: She is oriented to person, place, and time.  Well developed, well nourished, NAD, polite  HENT:  Head: Normocephalic and atraumatic.  Eyes: No scleral icterus.  Neck: Normal range of motion. Neck supple. No thyromegaly present.  Pulmonary/Chest: Effort normal.  Musculoskeletal: She exhibits no edema.  Bunion on MTP of  hallux bilaterally. TTP of the right heel at the insertion of the plantar fascia.  Neurological: She is alert and oriented to person, place, and time.  Mildly antalgic gait favoring right side  Skin: Skin is warm and dry. No rash noted. No erythema. No pallor.  Psychiatric: She has a normal mood and affect. Her behavior is normal. Thought content normal.  Vitals reviewed.    Assessment & Plan:    1. Plantar fasciitis of right foot - Ambulatory referral to Podiatry - traMADol (ULTRAM) 50 MG tablet; Take 1 tablet (50 mg total) by mouth every 12  (twelve) hours as needed.  Dispense: 60 tablet; Refill: 0 - naproxen (NAPROSYN) 500 MG tablet; Take 1 tablet (500 mg total) by mouth 2 (two) times daily with a meal.  Dispense: 60 tablet; Refill: 1  2. Bunion of left foot - Ambulatory referral to Podiatry - traMADol (ULTRAM) 50 MG tablet; Take 1 tablet (50 mg total) by mouth every 12 (twelve) hours as needed.  Dispense: 60 tablet; Refill: 0 - naproxen (NAPROSYN) 500 MG tablet; Take 1 tablet (500 mg total) by mouth 2 (two) times daily with a meal.  Dispense: 60 tablet; Refill: 1  3. Elevated lipoprotein(a) - Lipid panel   Meds ordered this encounter  Medications  . traMADol (ULTRAM) 50 MG tablet    Sig: Take 1 tablet (50 mg total) by mouth every 12 (twelve) hours as needed.    Dispense:  60 tablet    Refill:  0    Order Specific Question:   Supervising Provider    Answer:   Charlott Rakes [[id number]]  . naproxen (NAPROSYN) 500 MG tablet    Sig: Take 1 tablet (500 mg total) by mouth 2 (two) times daily with a meal.    Dispense:  60 tablet    Refill:  1    Order Specific Question:   Supervising Provider    Answer:   Charlott Rakes [[id number]]    Follow-up: 4 weeks for plantar fasciitis  Clent Demark PA

## 2017-11-08 LAB — LIPID PANEL
Chol/HDL Ratio: 3.8 ratio (ref 0.0–4.4)
Cholesterol, Total: 153 mg/dL (ref 100–199)
HDL: 40 mg/dL (ref >39)
LDL CALC: 92 mg/dL (ref 0–99)
Triglycerides: 107 mg/dL (ref 0–149)
VLDL CHOLESTEROL CAL: 21 mg/dL (ref 5–40)

## 2017-11-09 ENCOUNTER — Other Ambulatory Visit (INDEPENDENT_AMBULATORY_CARE_PROVIDER_SITE_OTHER): Payer: Self-pay | Admitting: Physician Assistant

## 2017-11-09 MED ORDER — LOVASTATIN 20 MG PO TABS: 20.0000 mg | ORAL_TABLET | Freq: Every day | ORAL | 3 refills | Status: DC

## 2017-11-10 ENCOUNTER — Telehealth (INDEPENDENT_AMBULATORY_CARE_PROVIDER_SITE_OTHER): Payer: Self-pay

## 2017-11-10 NOTE — Telephone Encounter (Signed)
Called placed using pacific interpreter Zenia Resides [phone]) left detailed message informing patient that cholesterol is controlled and lovastatin refill has been sent to [place]. Call [facility] with any questions or concerns. Nat Christen, CMA

## 2017-11-10 NOTE — Telephone Encounter (Signed)
-----   Message from Clent Demark, PA-C sent at 11/09/2017  8:57 AM EDT ----- Cholesterol is controlled. I am sending refill of Lovastatin to [place].

## 2017-12-07 ENCOUNTER — Ambulatory Visit: Payer: No Typology Code available for payment source | Admitting: Podiatry

## 2017-12-07 DIAGNOSIS — M21611 Bunion of right foot: Secondary | ICD-10-CM

## 2017-12-07 DIAGNOSIS — M21612 Bunion of left foot: Secondary | ICD-10-CM

## 2017-12-07 DIAGNOSIS — M2012 Hallux valgus (acquired), left foot: Secondary | ICD-10-CM

## 2017-12-07 DIAGNOSIS — M216X9 Other acquired deformities of unspecified foot: Secondary | ICD-10-CM

## 2017-12-07 DIAGNOSIS — M722 Plantar fascial fibromatosis: Secondary | ICD-10-CM

## 2017-12-07 DIAGNOSIS — M2011 Hallux valgus (acquired), right foot: Secondary | ICD-10-CM

## 2017-12-07 MED ORDER — METHYLPREDNISOLONE 4 MG PO TBPK: ORAL_TABLET | ORAL | 0 refills | Status: DC

## 2017-12-07 MED FILL — METHYLPREDNISOLONE 4 MG TAB: 4 | 6 days supply | Qty: 21 | Fill #0

## 2017-12-07 NOTE — Addendum Note (Signed)
Addended by: Allean Found on: 12/07/2017 01:26 PM   Modules accepted: Orders

## 2017-12-07 NOTE — Progress Notes (Addendum)
  Subjective:  Patient ID: Lori Morse, female    DOB: [birth year],  MRN: [id number]  Chief Complaint  Patient presents with  . Plantar Fasciitis    F/U R Plantar fasciitis Pt. stated,"Pain is about the same, I can't put any weight on my heel."   Tx: naproxen and tramadol  . Bunions    F/U B/L bunions Pt. stated," bunions are still bothering me."    52 y.o. female returns for the above complaint. Seen by Dr. Amalia Hailey 11/5 and 12/3 for this issue.  States that the right foot pain is about the same.  Has been using naproxen and tramadol.  Of note, patient was initially no-showed for the appointment today.  However it appears the patient initially did not check in and let the staff know that she was here for her appointment.  Patient was scheduled with interpreter however the interpreter similarly did not know the patient was here and apparently left. Interpretation during the visit was assisted by Allean Found, MA.   Objective:  There were no vitals filed for this visit. General AA&O x3. Normal mood and affect.  Vascular Pedal pulses palpable.  Neurologic Epicritic sensation grossly intact.  Dermatologic No open lesions. Skin normal texture and turgor.  Orthopedic: Pain to palpation about the right medial calcaneal tuber. Decreased ankle joint ROM bilat. Mild HAV deformity bilat with slight pain to palpation about the medial 1st MPJ. Good 1st MPJ ROM noted.  Deformity not track bound. No local edema, warmth, or reactive erythema of the first MPJ.   Assessment & Plan:  Patient was evaluated and treated and all questions answered.  Plantar Fasciitis, right - Educated on icing and stretching. Instructions given.  - Injection delivered to the plantar fascia as below.  - Discussed continued stretching and icing of the right lower extremity - Rx Medrol Pak for pain and inflammation  Procedure: Injection Tendon/Ligament Location: Right plantar fascia at the glabrous junction;  medial approach. Skin Prep: Alcohol. Injectate: 1 cc 0.5% marcaine plain, 1 cc dexamethasone phosphate, 0.5 cc kenalog 10. Disposition: Patient tolerated procedure well. Injection site dressed with a band-aid.-  HAV Deformity Bilat; Mild -Discussed etiology of bunion deformity. -Discussed shoe gear changes including wearing wider shoes to prevent irritation to the area.  -Correction of bunion deformity not medically necessary at this time.   Return if symptoms worsen or fail to improve.

## 2017-12-07 NOTE — Addendum Note (Signed)
Addended by: Allean Found on: 12/07/2017 01:15 PM   Modules accepted: Orders

## 2017-12-07 NOTE — Addendum Note (Signed)
Addended by: Hardie Pulley on: 12/07/2017 04:44 PM   Modules accepted: Orders

## 2017-12-08 ENCOUNTER — Ambulatory Visit (INDEPENDENT_AMBULATORY_CARE_PROVIDER_SITE_OTHER): Payer: Self-pay | Admitting: Physician Assistant

## 2017-12-08 ENCOUNTER — Encounter (INDEPENDENT_AMBULATORY_CARE_PROVIDER_SITE_OTHER): Payer: Self-pay | Admitting: Physician Assistant

## 2017-12-08 ENCOUNTER — Other Ambulatory Visit: Payer: Self-pay

## 2017-12-08 DIAGNOSIS — M21611 Bunion of right foot: Secondary | ICD-10-CM

## 2017-12-08 DIAGNOSIS — M722 Plantar fascial fibromatosis: Secondary | ICD-10-CM

## 2017-12-08 DIAGNOSIS — M21612 Bunion of left foot: Secondary | ICD-10-CM

## 2017-12-08 MED ORDER — TRAMADOL HCL 50 MG PO TABS: 50.0000 mg | ORAL_TABLET | Freq: Two times a day (BID) | ORAL | 0 refills | Status: DC | PRN

## 2017-12-08 MED ORDER — NAPROXEN 500 MG PO TABS: 500.0000 mg | ORAL_TABLET | Freq: Two times a day (BID) | ORAL | 1 refills | Status: DC

## 2017-12-08 NOTE — Patient Instructions (Signed)
Fascitis plantar  (Plantar Fasciitis)  La fascitis plantar es una afeccin dolorosa que se produce en el taln. Ocurre cuando la banda de tejido que conecta los dedos con el hueso del taln (fascia plantar) se irrita. Esto puede ocurrir despus de hacer mucho ejercicio u otras actividades repetitivas (lesin por uso excesivo). El dolor de la fascitis plantar puede ser de leve (irritacin) a intenso, y en los casos ms agudos puede dificultar que la persona camine o se mueva. Por lo general, el dolor es peor a la maana o despus de permanecer sentado o acostado durante un perodo.  CAUSAS  Este trastorno puede ser causado por:   Estar de pie durante largos perodos.   Usar zapatos que no calcen bien.   Practicar actividades de alto impacto, como correr, o hacer ejercicios aerbicos o ballet.   Tener sobrepeso.   Tener una forma de caminar (marcha) anormal.   Tener los msculos de la pantorrilla tensos.   Tener el arco alto en los pies.   Comenzar una nueva actividad fsica.  SNTOMAS  El sntoma principal de esta afeccin es el dolor en el taln. Otros sntomas pueden ser los siguientes:   Dolor que empeora despus de una actividad o un ejercicio.   Dolor ms intenso a la maana o despus de descansar.   Dolor que desaparece despus de caminar durante unos minutos.  DIAGNSTICO  Esta afeccin se puede diagnosticar en funcin de los signos y los sntomas. El mdico tambin le realizar un examen fsico para controlar si tiene lo siguiente:   Un rea dolorida en la parte inferior del pie.   El arco alto.   Dolor al mover el pie.   Dificultad para mover el pie.  Tambin puede necesitar estudios por imgenes para confirmar el diagnstico. Estos pueden incluir los siguientes:   Radiografas.   Ecografa.   Resonancia magntica.  TRATAMIENTO  El tratamiento de la fascitis plantar depende de la gravedad de la afeccin. El tratamiento puede incluir lo siguiente:   Reposo, hielo y analgsicos de venta  libre para controlar el dolor.   Ejercicios para estirar las pantorrillas y la fascia plantar.   Una frula que mantiene el pie estirado y hacia arriba mientras usted duerme (frula nocturna).   Fisioterapia para aliviar los sntomas y evitar problemas en el futuro.   Inyecciones de cortisona para aliviar el dolor intenso.   Tratamiento con ondas de choque extracorpreas para estimular con impulsos elctricos la fascia plantar lesionada. Esto suele usarse como un ltimo recurso antes de la ciruga.   Ciruga, si los otros tratamientos no han funcionado despus de 12meses.  INSTRUCCIONES PARA EL CUIDADO EN EL HOGAR   Tome los medicamentos solamente como se lo haya indicado el mdico.   Evite las actividades que causan dolor.   Frote la parte inferior del pie sobre una bolsa de hielo o una botella de agua fra. Haga esto durante 20minutos, de 3a 4veces al da.   Realice estiramientos simples como se lo haya indicado el mdico.   Trate de usar calzado deportivo con amortiguacin de aire o gel, o plantillas blandas.   Si el mdico se lo ha indicado, use una frula nocturna para dormir.   Cumpla con todas las visitas de control, segn le indique su mdico.  PREVENCIN   No realice ejercicios ni actividades que le causen dolor en el taln.   Considere la posibilidad de empezar actividades de bajo impacto si sigue teniendo problemas.   Pierda peso si   lo necesita.  La mejor forma de prevenir la fascitis plantar es evitar las actividades que lesionan ms la fascia plantar.  SOLICITE ATENCIN MDICA SI:   Los sntomas no desaparecen despus del tratamiento en su casa.   El dolor empeora.   El dolor afecta la capacidad de moverse o de realizar las actividades diarias.  Esta informacin no tiene como fin reemplazar el consejo del mdico. Asegrese de hacerle al mdico cualquier pregunta que tenga.  Document Released: 01/26/2005 Document Revised: 08/10/2015 Document Reviewed: 02/26/2014  Elsevier  Interactive Patient Education  2018 Elsevier Inc.

## 2017-12-08 NOTE — Addendum Note (Signed)
Addended by: Hardie Pulley on: 12/08/2017 05:05 PM   Modules accepted: Level of Service

## 2017-12-08 NOTE — Progress Notes (Signed)
Subjective:  Patient ID: Lori Morse, female    DOB: [birth year]  Age: 52 y.o. MRN: [id number]  CC: f/u plantar fasciits   HPI  Lori Rinks [place] a 52 y.o.femalewith a medical history of plantar fasciitis right foot and sciatica left side presentson f/u of plantar fasciitis. Was referred to podiatry for plantar fasciitis and had appointment yesterday. Was reported as a no show. However patient states she reported earlier than her appointment time and was in the waiting room for two hours. Pt was eventually seen briefly and injected into the heel. This is her first injection this year and had two injections last year. Pt was also told there was no interpreter to help her but had a spanish speaking nurse interpret for her. Pt gathered that she can not have surgery done because of lack of insurance. Pt is qualified for 100% CAFA. Pt is still having much pain in the right heel. Says pain is progressing upwards to her calf. Unable to work due to severe pain. Pt is distraught and under severe financial stress.      Pt also having problems filling her opioid pain medication as she only has an out of country identification card. Tried to fill at [facility] on [address]. Says Tramadol is effective in controlling her pain and taking once to twice a day.         Outpatient Medications Prior to Visit  Medication Sig Dispense Refill  . lovastatin (MEVACOR) 20 MG tablet Take 1 tablet (20 mg total) by mouth at bedtime. 90 tablet 3  . naproxen (NAPROSYN) 500 MG tablet Take 1 tablet (500 mg total) by mouth 2 (two) times daily with a meal. 60 tablet 1  . traMADol (ULTRAM) 50 MG tablet Take 1 tablet (50 mg total) by mouth every 12 (twelve) hours as needed. 60 tablet 0  . methylPREDNISolone (MEDROL DOSEPAK) 4 MG TBPK tablet 6 Day Taper Pack. Take as Directed. 21 tablet 0   No facility-administered medications prior to visit.      ROS Review of Systems  Constitutional: Negative for chills, fever  and malaise/fatigue.  Eyes: Negative for blurred vision.  Respiratory: Negative for shortness of breath.   Cardiovascular: Negative for chest pain and palpitations.  Gastrointestinal: Negative for abdominal pain and nausea.  Genitourinary: Negative for dysuria and hematuria.  Musculoskeletal: Negative for joint pain and myalgias.       RLE pain  Skin: Negative for rash.  Neurological: Negative for tingling and headaches.  Psychiatric/Behavioral: Negative for depression. The patient is not nervous/anxious.     Objective:  Ht 5\' 2"  (1.575 m)   Wt 189 lb 9.6 oz (86 kg)   LMP 11/25/2017 (Exact Date)   BMI 34.68 kg/m   BP/Weight 12/08/2017 11/07/2017 2/54/2706  Systolic BP - 237 628  Diastolic BP - 67 67  Wt. (Lbs) 189.6 189 190  BMI 34.68 34.57 35.9      Physical Exam  Constitutional: She is oriented to person, place, and time.  Well developed, well nourished, NAD, polite  HENT:  Head: Normocephalic and atraumatic.  Eyes: No scleral icterus.  Neck: Normal range of motion. Neck supple. No thyromegaly present.  Cardiovascular: Normal rate, regular rhythm and normal heart sounds.  Pulmonary/Chest: Effort normal and breath sounds normal.  Abdominal: Soft. Bowel sounds are normal. There is no tenderness.  Musculoskeletal: She exhibits no edema.  Neurological: She is alert and oriented to person, place, and time.  Skin: Skin is warm and dry. No rash  noted. No erythema. No pallor.  Psychiatric: She has a normal mood and affect. Her behavior is normal. Thought content normal.  Vitals reviewed.    Assessment & Plan:   1. Plantar fasciitis of right foot - naproxen (NAPROSYN) 500 MG tablet; Take 1 tablet (500 mg total) by mouth 2 (two) times daily with a meal.  Dispense: 60 tablet; Refill: 1 - traMADol (ULTRAM) 50 MG tablet; Take 1 tablet (50 mg total) by mouth every 12 (twelve) hours as needed.  Dispense: 60 tablet; Refill: 0  2. Bunion of left foot - naproxen (NAPROSYN) 500 MG  tablet; Take 1 tablet (500 mg total) by mouth 2 (two) times daily with a meal.  Dispense: 60 tablet; Refill: 1 - traMADol (ULTRAM) 50 MG tablet; Take 1 tablet (50 mg total) by mouth every 12 (twelve) hours as needed.  Dispense: 60 tablet; Refill: 0  3. Bunion, right foot - naproxen (NAPROSYN) 500 MG tablet; Take 1 tablet (500 mg total) by mouth 2 (two) times daily with a meal.  Dispense: 60 tablet; Refill: 1 - traMADol (ULTRAM) 50 MG tablet; Take 1 tablet (50 mg total) by mouth every 12 (twelve) hours as needed.  Dispense: 60 tablet; Refill: 0  * Greater than 50 minutes coordinating care. I asked CMA to call [facility] and notify them that patient has CAFA 100% and is therefore eligible for surgical procedure. Manager at [facility] was notified and is looking into CAFA details. I then called [facility] because pt states they contact her in [place]. Receptionist states they do not have an interpreter system for phone calls. I made interpreter aware that they have access to an interpreter line since they are part of [place]. I asked that she share the interpreter phone line with her manager. Receptionist said manager will be in contact with patient as soon as possible.      Meds ordered this encounter  Medications  . naproxen (NAPROSYN) 500 MG tablet    Sig: Take 1 tablet (500 mg total) by mouth 2 (two) times daily with a meal.    Dispense:  60 tablet    Refill:  1    Order Specific Question:   Supervising Provider    Answer:   Lori Morse [[id number]]  . traMADol (ULTRAM) 50 MG tablet    Sig: Take 1 tablet (50 mg total) by mouth every 12 (twelve) hours as needed.    Dispense:  60 tablet    Refill:  0    Order Specific Question:   Supervising Provider    Answer:   Lori Morse [[id number]]    Follow-up: Return in about 4 weeks (around 01/05/2018) for plantar fasciitis pain.   Clent Demark PA

## 2017-12-15 ENCOUNTER — Telehealth (INDEPENDENT_AMBULATORY_CARE_PROVIDER_SITE_OTHER): Payer: Self-pay | Admitting: Physician Assistant

## 2017-12-15 NOTE — Telephone Encounter (Signed)
Patient called wanting information about her surgery. Patient states that her PCP told her to call in a week if she did not hear anything. Patient states that nobody has contacted her about scheduling an appointment.  Please advice [phone]  Thank you Emmit Pomfret

## 2017-12-19 NOTE — Telephone Encounter (Signed)
FWD to PCP. Tempestt S Roberts, CMA  

## 2017-12-19 NOTE — Telephone Encounter (Signed)
Hi Lori Morse, hope you are well. We are having problems scheduling patient for possible surgery for plantar fasciitis. Seems [facility] does not have interpreter services and can not communicate with patient. Also, they are unaware of CAFA. I called their office last week and wrote the following in my note:  I asked CMA to call [facility] and notify them that patient has CAFA 100% and is therefore eligible for surgical procedure. Manager at [facility] was notified and is looking into CAFA details. I then called [facility] because pt states they contact her in [place]. Receptionist states they do not have an interpreter system for phone calls. I made interpreter aware that they have access to an interpreter line since they are part of [place]. I asked that she share the interpreter phone line with her manager. Receptionist said manager will be in contact with patient as soon as possible.   Could you please see if they can take CAFA? It might be good to talk to their manager since the manager said she was going to look into CAFA.

## 2017-12-20 ENCOUNTER — Telehealth: Payer: Self-pay | Admitting: *Deleted

## 2017-12-20 NOTE — Telephone Encounter (Signed)
"  I am calling from [facility] office.  I am calling to see when Lori Morse's surgery is?  She is covered by [facility], 100% coverage."  I don't have her scheduled for surgery.  Dr. March Rummage stated in his note that surgery was not medically necessary at this time.  "Well her foot is really hurting her."  She's going to need an appointment to see him.  Would you like me to transfer you to a scheduler to make that appointment?  "Yes, I would like that."  I transferred the call to [place], so she could schedule the appointment.

## 2017-12-20 NOTE — Telephone Encounter (Signed)
I'm doing pretty good  Thank you . I hope you are . I spoke to  [place] from  [employer] she said that they accept Cafa and if she need surgery it will be cover too. Also I spoke to  [place] from Surgery dept she saw the previous notes and Dr Hardie Pulley said no surgery is needed .  I  scheduled her an appointment with Dr Amalia Hailey he speak a little Spanish for Monday 8/26 at 9:45am . I notified  the patient to further discuss surgery options.

## 2017-12-20 NOTE — Telephone Encounter (Signed)
Oh, that is strange. Patient told me that the podiatrist (through nurse interpreter) at [facility] told her she needed surgery but she could not do it because she had no insurance. I hope Dr. Amalia Hailey can treat patient better.

## 2017-12-25 ENCOUNTER — Encounter: Payer: Self-pay | Admitting: Podiatry

## 2017-12-25 ENCOUNTER — Ambulatory Visit: Payer: No Typology Code available for payment source | Admitting: Podiatry

## 2017-12-25 DIAGNOSIS — M722 Plantar fascial fibromatosis: Secondary | ICD-10-CM

## 2017-12-25 MED ORDER — MELOXICAM 15 MG PO TABS: 15.0000 mg | ORAL_TABLET | Freq: Every day | ORAL | 1 refills | Status: AC

## 2017-12-25 MED FILL — MELOXICAM 15 MG TABLET: 15 | 60 days supply | Qty: 60 | Fill #0

## 2017-12-27 NOTE — Progress Notes (Signed)
   Subjective: 52 year old female presenting today for follow up evaluation of plantar fasciitis of the right foot. She states the pain has worsened. She states she has been taking the Naproxen and Tramadol previously prescribed but is concerned that it is making her hair fall out. There are no modifying factors noted. Patient is here for further evaluation and treatment.   No past medical history on file.   Objective: Physical Exam General: The patient is alert and oriented x3 in no acute distress.  Dermatology: Skin is warm, dry and supple bilateral lower extremities. Negative for open lesions or macerations bilateral.   Vascular: Dorsalis Pedis and Posterior Tibial pulses palpable bilateral.  Capillary fill time is immediate to all digits.  Neurological: Epicritic and protective threshold intact bilateral.   Musculoskeletal: Tenderness to palpation to the plantar aspect of the right heel along the plantar fascia. All other joints range of motion within normal limits bilateral. Strength 5/5 in all groups bilateral.    Assessment: 1. Plantar fasciitis right 2. Pain in right foot  Plan of Care:  1. Patient evaluated. 2. Plantar fascial brace dispensed.  3. Prescription for Meloxicam provided to patient. Discontinue taking Naproxen 500 mg.  4. Recommended good shoe gear.  5. Return to clinic in 6 weeks.     [name], DPM [facility]  Dr. [name], DPM    [address], [place] [address]                Office [phone]  Fax [phone]

## 2018-01-08 ENCOUNTER — Encounter (INDEPENDENT_AMBULATORY_CARE_PROVIDER_SITE_OTHER): Payer: Self-pay | Admitting: Physician Assistant

## 2018-01-08 ENCOUNTER — Other Ambulatory Visit: Payer: Self-pay

## 2018-01-08 ENCOUNTER — Ambulatory Visit (INDEPENDENT_AMBULATORY_CARE_PROVIDER_SITE_OTHER): Payer: Self-pay | Admitting: Physician Assistant

## 2018-01-08 VITALS — BP 122/78 | HR 61 | Temp 98.2°F | Ht 62.0 in | Wt 183.6 lb

## 2018-01-08 DIAGNOSIS — F32A Depression, unspecified: Secondary | ICD-10-CM

## 2018-01-08 DIAGNOSIS — M722 Plantar fascial fibromatosis: Secondary | ICD-10-CM

## 2018-01-08 DIAGNOSIS — F329 Major depressive disorder, single episode, unspecified: Secondary | ICD-10-CM

## 2018-01-08 MED ORDER — ACETAMINOPHEN-CODEINE #3 300-30 MG PO TABS: 1.0000 | ORAL_TABLET | Freq: Two times a day (BID) | ORAL | 0 refills | Status: DC | PRN

## 2018-01-08 MED ORDER — DULOXETINE HCL 30 MG PO CPEP: 30.0000 mg | ORAL_CAPSULE | Freq: Every day | ORAL | 3 refills | Status: DC

## 2018-01-08 NOTE — Patient Instructions (Addendum)
Community Resources  Advocacy/Legal Legal Aid [place]:  [phone]  /  [phone]  [place]:  [phone]  Family Service of the [facility] 24-hr Crisis line:  [phone]  [facility], [place]:  [phone]  [place] (custody):  [phone]  [facility]:   [phone]    Baby & Breastfeeding Car Seat Inspection @ Various [place].- call [place] Lactation  [phone]  [place] Lactation [phone]  [place]: [phone] ([place]);  [phone] ([place])  [place] League:  [phone]   [place] Child Development: [phone] [facility] [place]) / [phone] (HP)  - Child Care Resources/ Referrals/ Scholarships  - Head Start/ Early Head Start (call or apply online)  [place] DHHS: [place] Pre-K :  [phone] / [phone]   Employment / [place]: [phone] / [place] ([place]): [phone] ([place]) / [phone] ([place])  [place]: [phone] / [phone]   Public Library Job & Career Center: [phone]  DHHS Work First: [phone] ([place]) / [phone] ([place])  [place]:  [place]:  [phone]  [employer]: [place] (furniture):  [place] Helping Hands: [phone]  [place]  [place]- SNAP/ Food Stamps: [phone]  [place]: Letta [phone] ;  HP [phone]  [place]  During the summer, text "FOOD" to [place] / Clinics (Adults) [place] (for Adults) through [facility]: [phone]  [place]:   [place]:   [phone]  Health Department:  [place]:  [phone] / [phone]  [facility] [place]:   [phone]  [facility]:   [phone] x [place]:   [place]:  [place]:  [phone]   [place] for [facility] [place]):  [phone]  Fa[facility]:  [place]:  [place]:  [place]:  [place]  www.youthsafegso.org  PFLAG  [phone] / info@[place] Dorothea Glassman Project:  [phone]   Mental Health/ Substance Use Family Service of the [place]  [place]:  [phone] or [phone]  [facility] of [facility]:  [phone]  Journeys Counseling:  [place]:  [phone]  [place] Sessions (walk-ins)  [phone] / [place]:  [phone]  Alcoholics Anonymous:  [phone]  Narcotics Anonymous:  [phone]  Quit Smoking Hotline:  800-QUIT-NOW (660)802-2859)   Parenting [place]:  [place]:  [phone]  YWCA: [phone]  UNCG: Bringing Out the Best:  [phone]               Thriving at Three (Hispanic families): [phone]  Healthy Start ([place]):  [phone]  Parents as Teachers:  [place] Together (Immigrants): [phone]   Poison Control [phone]  [place] Open Doors Application: ImDemand.es  [place] of [place]: http://www.-[place].gov/index.aspx?page=3615   Special Needs Family Support Network:  [phone]  Autism Society of [place]:   [phone] or [phone] /  [place]:  [phone]  ARC of [place]:  [place] ([place]):  [phone]  [facility] (Care Coordination for Children):  [phone]   Transportation Medicaid Transportation: [phone] to apply  [place]: [phone] (reduced-fare bus ID to [place])  SCAT Paratransit services: Eligible riders only, call [phone] for application   Tutoring/Mentoring [place]: [place]: [phone] ([place])  ACES through child's school: [place]: contact your local Y  [place] Program: [phone]      Fascitis plantar con rehabilitacin Plantar Fasciitis Rehab Consulte al mdico qu ejercicios son seguros para usted. Haga los ejercicios exactamente como se lo haya indicado el mdico y gradelos como se lo hayan indicado. Es normal sentir tirantez, tensin, presin o molestias leves mientras hace estos ejercicios, pero debe detenerse de inmediato si siente dolor repentino o si el dolor empeora. No comience a hacer estos ejercicios hasta que se lo indique el mdico. EJERCICIOS DE [employer] Y AMPLITUD DE MOVIMIENTOS Estos ejercicios calientan los msculos y las articulaciones, y mejoran la movilidad y la flexibilidad del pie. Adems, ayudan a Best boy. EjercicioA: Estiramiento de la fascia plantar 1. Sintese con la pierna izquierda/derecha cruzada sobre la rodilla opuesta. 2. Sostenga el taln con [place] Foots, con el pulgar cerca del arco. Con la otra mano, sostenga los dedos de los pies y empjelos con [place]. Debe sentir un estiramiento en la parte de abajo de los dedos o del pie, o de [place]. 3. Mantenga esta posicin durante _________ segundos. 4. Afloje lentamente los dedos y vuelva a la posicin inicial. Repita __________ veces. Realice este ejercicio __________ veces al da. EjercicioB: Msculos gemelos, de  pie 1. Prese con las Yahoo[employer] Inc pared. 2. Extienda la pierna izquierda/derecha hacia atrs y flexione ligeramente la rodilla de la pierna de adelante. 3. Mantenga los talones en el suelo y la rodilla de atrs extendida, y pase el peso hacia la pared, sin arquear la espalda. Debe sentir un estiramiento suave en la pantorrilla izquierda/derecha. 4. Mantenga esta posicin durante ___________ segundos. Repita __________ veces. Realice este ejercicio __________ veces al da. EjercicioC: Msculo sleo, de pie 1. Prese con las [employer] pared. 2. Extienda la pierna izquierda/derecha hacia atrs y flexione ligeramente la rodilla de la pierna de adelante. 3. Mantenga los talones apoyados en el suelo, flexione la rodilla de atrs y lleve el peso ligeramente a la pierna de atrs. Debe sentir un estiramiento suave en la parte profunda de la pantorrilla. 4. Mantenga esta posicin durante ___________ segundos. Repita __________ veces. Realice este ejercicio __________ veces al da. EjercicioD: Msculos gemelos y sleo, de pie 1. Prese sobre un escaln apoyando solo la regin metatarsiana de su pie derecho/izquierdo. La parte metatarsiana de la planta del pie es la superficie sobre la que caminamos, justo debajo de los dedos. 2. Mantenga el otro pie apoyado con firmeza en el mismo escaln. 3. Sostngase de la pared o de una baranda para mantener el equilibrio. 4. [place]ante lentamente el Google, y permita que el peso del cuerpo presione el taln sobre el borde del escaln. Debe sentir un estiramiento en la pantorrilla izquierda/derecha. 5. Mantenga esta posicin durante ___________ segundos. 6. Vuelva a poner ambos pies sobre el escaln. 7. Repita este ejercicio con una leve flexin en la rodilla izquierda/derecha. Reptalo __________ veces con la rodilla izquierda/derecha extendida y __________ veces con la rodilla izquierda/derecha flexionada. Realice este ejercicio __________  Vicenta A[place]  al da. Jimmey Ralph DE EQUILIBRIO Este ejercicio aumenta el equilibrio y el control de la fuerza del arco, para ayudar a reducir la presin sobre la fascia plantar. EjercicioE: Pararse en una sola pierna 1. Sin calzado, prese cerca de una baranda o [place]. Puede sostenerse de la baranda o del marco de la puerta, segn lo necesite. 2. Prese sobre el pie izquierdo/derecho. Sin despegar el dedo gordo del suelo, intente mantener el arco levantado. No deje que el pie se vaya hacia adentro. 3. Mantenga esta posicin durante ___________ segundos. 4. Si este ejercicio es muy fcil, puede intentar [employer] con los ojos cerrados o parado sobre [place]. Repita __________ veces. Realice este ejercicio __________ veces al da. Esta informacin no tiene Marine scientist el consejo del mdico. Asegrese de hacerle al mdico cualquier pregunta que tenga. Document Released: 02/02/2006 Document Revised: 09/02/2014 Document Reviewed: 03/02/2015 Elsevier Interactive Patient Education  2018 [employer].

## 2018-01-08 NOTE — Progress Notes (Signed)
Patient would like refill of prednisone

## 2018-01-08 NOTE — Progress Notes (Signed)
Subjective:  Patient ID: Lori Morse, female    DOB: Jul 15, 1965  Age: 52 y.o. MRN: [id number]  CC: plantar fasciitis  HPI  Lori Mariotti Huertais a 52 y.o.femalewith a medical history of PTSD, plantar fasciitis right foot and sciatica left side presentson f/u of plantar fasciitis. Pain currently 5/10. Has been managed by [facility] with her last visit to their office two weeks ago. Pt was advised to wear plantar fasciitis brace, take meloxicam, and to return in six weeks. Pt says she was offered to have a third injection of steroids done to her heel but she declined as the previous steroid injections did not last more than two weeks.     Pt states she is having much difficulty with her finances due to inability to work 2/2 plantar fasciitis. She is currently living with her son whom has a girlfriend working as a prostitute. Pt is worried about her 39 yo son since he is exposed to his girlfriend's alcoholism and possibly to venereal disease. Son's girlfriend has verbally abused pt's decision to work in housekeeping, calling her stupid for working for 2-3 dollars per hour. 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 Pt wants to move out to have peace and avoid worsening of her underlying depression. Does not endorse suicidal ideation or intent. Does not endorse any other symptoms or complaints.     Outpatient Medications Prior to Visit  Medication Sig Dispense Refill  . lovastatin (MEVACOR) 20 MG tablet Take 1 tablet (20 mg total) by mouth at bedtime. 90 tablet 3  . meloxicam (MOBIC) 15 MG tablet Take 1 tablet (15 mg total) by mouth daily. 60 tablet 1  . naproxen (NAPROSYN) 500 MG tablet Take 1 tablet (500 mg total) by mouth 2 (two) times daily with a meal. 60 tablet 1  . traMADol (ULTRAM) 50 MG tablet Take 1 tablet (50 mg total) by mouth every 12 (twelve) hours as needed. 60 tablet 0   No  facility-administered medications prior to visit.      ROS Review of Systems  Constitutional: Negative for chills, fever and malaise/fatigue.  Eyes: Negative for blurred vision.  Respiratory: Negative for shortness of breath.   Cardiovascular: Negative for chest pain and palpitations.  Gastrointestinal: Negative for abdominal pain and nausea.  Genitourinary: Negative for dysuria and hematuria.  Musculoskeletal: Negative for joint pain and myalgias.       Right foot pain  Skin: Negative for rash.  Neurological: Negative for tingling and headaches.  Psychiatric/Behavioral: Negative for depression. The patient is not nervous/anxious.     Objective:  BP 122/78 (BP Location: Left Arm, Patient Position: Sitting, Cuff Size: Normal)   Pulse 61   Temp 98.2 F (36.8 C) (Oral)   Ht 5\' 2"  (1.575 m)   Wt 183 lb 9.6 oz (83.3 kg)   LMP 12/25/2017 (Exact Date)   SpO2 95%   BMI 33.58 kg/m   BP/Weight 01/08/2018 09/03/6566 05/04/7515  Systolic BP 001 749 449  Diastolic BP 78 75 67  Wt. (Lbs) 183.6 189.6 189  BMI 33.58 34.68 34.57      Physical Exam  Constitutional: She is oriented to person, place, and time.  Well developed, well nourished, NAD, polite, wearing plantar fasciitis brace  HENT:  Head: Normocephalic and atraumatic.  Eyes: No scleral icterus.  Neck: Normal range of motion. Neck supple. No thyromegaly present.  Cardiovascular: Normal rate, regular rhythm  and normal heart sounds.  Pulmonary/Chest: Effort normal and breath sounds normal.  Musculoskeletal: She exhibits no edema.  Neurological: She is alert and oriented to person, place, and time.  Skin: Skin is warm and dry. No rash noted. No erythema. No pallor.  Psychiatric: Her behavior is normal. Thought content normal.  Depressed and tearful  Vitals reviewed.    Assessment & Plan:     1. Plantar fasciitis of right foot - Begin acetaminophen-codeine (TYLENOL #3) 300-30 MG tablet; Take 1 tablet by mouth every 12  (twelve) hours as needed for moderate pain.  Dispense: 60 tablet; Refill: 0 - Continue management with podiatrist.  2. Depression, unspecified depression type - Begin DULoxetine (CYMBALTA) 30 MG capsule; Take 1 capsule (30 mg total) by mouth daily.  Dispense: 30 capsule; Refill: 3   Meds ordered this encounter  Medications  . acetaminophen-codeine (TYLENOL #3) 300-30 MG tablet    Sig: Take 1 tablet by mouth every 12 (twelve) hours as needed for moderate pain.    Dispense:  60 tablet    Refill:  0    Order Specific Question:   Supervising Provider    Answer:   Charlott Rakes [[id number]]  . DULoxetine (CYMBALTA) 30 MG capsule    Sig: Take 1 capsule (30 mg total) by mouth daily.    Dispense:  30 capsule    Refill:  3    Order Specific Question:   Supervising Provider    Answer:   Charlott Rakes [[id number]]    Follow-up: Return if symptoms worsen or fail to improve.   Clent Demark PA

## 2018-01-12 ENCOUNTER — Telehealth: Payer: Self-pay | Admitting: Licensed Clinical Social Worker

## 2018-01-12 NOTE — Telephone Encounter (Signed)
Call placed to patient utilizing [place] [phone]) in attempt to follow up on behavioral health and resource needs. A message was left requesting a return call.

## 2018-01-16 ENCOUNTER — Telehealth: Payer: Self-pay | Admitting: Licensed Clinical Social Worker

## 2018-01-16 NOTE — Telephone Encounter (Signed)
LCSWA was informed of an incoming call from patient. [place] desk staff was directed to inform pt that [place] will call back with interpretor.   [place], [place] [phone]) was utilized to contact pt via telephone. Pt did not answer and a voice message was left requesting a callback.

## 2018-01-18 MED FILL — ACETAMINOPHEN/COD #3 TABLET: 300-30 | 30 days supply | Qty: 60 | Fill #0

## 2018-01-18 MED FILL — ?DULOXETINE HCL 30 MG CPEP: 30 | 30 days supply | Qty: 30 | Fill #0

## 2018-01-19 ENCOUNTER — Ambulatory Visit: Payer: Self-pay | Attending: Family Medicine | Admitting: Licensed Clinical Social Worker

## 2018-01-19 DIAGNOSIS — F419 Anxiety disorder, unspecified: Secondary | ICD-10-CM

## 2018-01-19 DIAGNOSIS — F331 Major depressive disorder, recurrent, moderate: Secondary | ICD-10-CM

## 2018-01-19 NOTE — BH Specialist Note (Signed)
Integrated Behavioral Health Initial Visit  MRN: [id number] Name: Lori Morse  Number of [place] Clinician visits:: 1/6 Session Start time: 4:10 PM  Session End time: 4:40 PM Total time: 30 minutes  Type of Service: [place] Interpretor:Yes.   Interpretor Name and Language: Pt signed the waiver of right to free interpreter services. Per pt's request, interpretor services were conducted by [employer], Hoover Brunette   Warm Hand Off Completed.       SUBJECTIVE: Lori Morse is a 52 y.o. female accompanied by self Patient was referred by Lynnell Grain for depression and anxiety. Patient reports the following symptoms/concerns: feelings of sadness, worry, withdrawn behavior, difficulty sleeping, and decreased concentration Duration of problem: Ongoing; Severity of problem: moderate  OBJECTIVE: Mood: Anxious and Depressed and Affect: Tearful Risk of harm to self or others: No plan to harm self or others  LIFE CONTEXT: Family and Social: Pt resides with her adult son and his girlfriend. She has five adult daughters and parents who reside in [place] School/Work: Pt reports inability to work due to ongoing pain from medical conditions  Self-Care: Pt participates in medication management Life Changes: Pt has ongoing medical conditions that result in chronic pain. She has a long hx of trauma and reports being triggered by the adults residing in her home. Pt is also saddened by her inability to provide financially to elderly parents.  GOALS ADDRESSED: Patient will: 1. Reduce symptoms of: anxiety, depression and stress 2. Increase knowledge and/or ability of: coping skills and healthy habits  3. Demonstrate ability to: Increase healthy adjustment to current life circumstances and Increase adequate support systems for patient/family  INTERVENTIONS: Interventions utilized: Supportive Counseling, Psychoeducation  and/or Health Education and Link to [employer]  Standardized Assessments completed: Not Needed  ASSESSMENT: Patient currently experiencing depression and anxiety triggered by ongoing medical conditions that result in chronic pain. She has a long hx of trauma and reports being triggered by the adults residing in her home. Pt is also saddened by her inability to provide financial assistance to her elderly parents. She receives limited support from family and friends.   Patient may benefit from psychoeducation and psychotherapy. She participates in medication management through PCP. LCSWA educated pt on the correlation between one' physical and mental health, in addition, to how stress can negatively impact both. Therapeutic strategies were discussed to assist in the management and/or decrease of symptoms. Pt was provided information on bilingual therapists, resources for food insecurity, and a SCAT application to assist with stable transportation. Front Environmental manager, Hoover Brunette, assisted pt in completion of SCAT application.  PLAN: 1. Follow up with behavioral health clinician on : Pt was encouraged to contact LCSWA if symptoms worsen or fail to improve to schedule behavioral appointments at [facility]. 2. Behavioral recommendations: LCSWA recommends that pt apply healthy coping skills discussed and initiate psychotherapy. Pt is encouraged to schedule follow up appointment with LCSWA 3. Referral(s): [place] (In Clinic), [place] (LME/Outside Clinic) and [employer] Resources:  Transportation 4. "From scale of 1-10, how likely are you to follow plan?":   Rebekah [place], LCSW 01/22/18 3:55 PM

## 2018-02-05 ENCOUNTER — Ambulatory Visit: Payer: No Typology Code available for payment source | Admitting: Podiatry

## 2018-02-09 ENCOUNTER — Encounter (INDEPENDENT_AMBULATORY_CARE_PROVIDER_SITE_OTHER): Payer: Self-pay | Admitting: Physician Assistant

## 2018-02-09 ENCOUNTER — Other Ambulatory Visit: Payer: Self-pay

## 2018-02-09 ENCOUNTER — Ambulatory Visit (INDEPENDENT_AMBULATORY_CARE_PROVIDER_SITE_OTHER): Payer: Self-pay | Admitting: Physician Assistant

## 2018-02-09 VITALS — BP 122/77 | HR 51 | Temp 98.2°F | Ht 62.0 in | Wt 182.2 lb

## 2018-02-09 DIAGNOSIS — M722 Plantar fascial fibromatosis: Secondary | ICD-10-CM

## 2018-02-09 DIAGNOSIS — F329 Major depressive disorder, single episode, unspecified: Secondary | ICD-10-CM

## 2018-02-09 DIAGNOSIS — F32A Depression, unspecified: Secondary | ICD-10-CM

## 2018-02-09 MED ORDER — MELOXICAM 15 MG PO TABS: 15.0000 mg | ORAL_TABLET | Freq: Every day | ORAL | 1 refills | Status: DC

## 2018-02-09 MED ORDER — ACETAMINOPHEN-CODEINE #3 300-30 MG PO TABS: 1.0000 | ORAL_TABLET | Freq: Two times a day (BID) | ORAL | 0 refills | Status: DC | PRN

## 2018-02-09 MED ORDER — DULOXETINE HCL 30 MG PO CPEP: 30.0000 mg | ORAL_CAPSULE | Freq: Every day | ORAL | 3 refills | Status: DC

## 2018-02-09 NOTE — Progress Notes (Addendum)
Subjective:  Patient ID: Lori Morse, female    DOB: Jan 23, 1966  Age: 52 y.o. MRN: [id number]  CC:   HPI Lori Morse [facility] a 52 y.o.femalewith a medical history of PTSD, plantar fasciitis right foot and sciatica left side presentson f/u of plantar fasciitis. Says her right foot/heel is still painful. Has received management of plantar fasciitis at [facility]. Has thus far received three steroid injections to the right heel. She reports having been told she would need surgery for plantar fasciitis by Dr. Amalia Morse. Dr Lori Morse has also recommended surgery when she saw him in 2003. Pt has CAFA but has thus far been refused surgery because [facility] did not know about CAFA as coverage. [facility] Manager [name] was contacted one month and she had told us that she would look into CAFA but has not replied to [place] yet. Pt reports temporary relief of foot Morse with Tylenol #3. Taking Tylenol #3 sparingly and brings the bottle with approximately 10 pills remaining. Pt's ability to work has been significantly affected and can only take part time work due to the right heel Morse.     Pt also has much stress due to the situation of her son and his girlfriend. Says son and girlfriend drink excessively. Son's girlfriend is a Best boy and has insulted patient telling pt she is stupid for working less than minimum wage. Says she is now living by herself and feels somewhat more peaceful. Feels guilty that she is leaving her son to the influences of his girlfriend. Pt taking cymbalta as directed and feels her mood is improving but PHQ9 and GAD7 do not reflect this. Does not endorse suicidal/homicidal ideation or intent.        Outpatient Medications Prior to Visit  Medication Sig Dispense Refill  . acetaminophen-codeine (TYLENOL #3) 300-30 MG tablet Take 1 tablet by mouth every 12 (twelve) hours as needed for moderate Morse. 60 tablet 0  . DULoxetine  (CYMBALTA) 30 MG capsule Take 1 capsule (30 mg total) by mouth daily. 30 capsule 3  . lovastatin (MEVACOR) 20 MG tablet Take 1 tablet (20 mg total) by mouth at bedtime. 90 tablet 3  . meloxicam (MOBIC) 15 MG tablet Take 15 mg by mouth daily.     No facility-administered medications prior to visit.      ROS Review of Systems  Constitutional: Negative for chills, fever and malaise/fatigue.  Eyes: Negative for blurred vision.  Respiratory: Negative for shortness of breath.   Cardiovascular: Negative for chest Morse and palpitations.  Gastrointestinal: Negative for abdominal Morse and nausea.  Genitourinary: Negative for dysuria and hematuria.  Musculoskeletal: Negative for joint Morse and myalgias.       Right heel Morse  Skin: Negative for rash.  Neurological: Negative for tingling and headaches.  Psychiatric/Behavioral: Negative for depression. The patient is not nervous/anxious.     Objective:  BP 122/77 (BP Location: Left Arm, Patient Position: Sitting, Cuff Size: Normal)   Pulse (!) 51   Temp 98.2 F (36.8 C) (Oral)   Ht 5\' 2"  (1.575 m)   Wt 182 lb 3.2 oz (82.6 kg)   LMP 01/26/2018 (Exact Date)   SpO2 94%   BMI 33.32 kg/m   BP/Weight 02/09/2018 06/08/784 11/04/4490  Systolic BP 010 071 219  Diastolic BP 77 78 75  Wt. (Lbs) 182.2 183.6 189.6  BMI 33.32 33.58 34.68      Physical Exam  Constitutional: She is oriented  to person, place, and time.  Well developed, well nourished, NAD, polite  HENT:  Head: Normocephalic and atraumatic.  Eyes: No scleral icterus.  Neck: Normal range of motion. Neck supple. No thyromegaly present.  Cardiovascular: Normal rate, regular rhythm and normal heart sounds.  Pulmonary/Chest: Effort normal and breath sounds normal.  Musculoskeletal: She exhibits no edema.  TTP along the plantar and medial aspect of right heel  Neurological: She is alert and oriented to person, place, and time.  Antalgic gait favoring right side. Strength 5/5 of the  RLE  Skin: Skin is warm and dry. No rash noted. No erythema. No pallor.  Psychiatric: She has a normal mood and affect. Her behavior is normal. Thought content normal.  Vitals reviewed.    Assessment & Plan:    1. Plantar fasciitis of right foot - Refill meloxicam (MOBIC) 15 MG tablet; Take 1 tablet (15 mg total) by mouth daily.  Dispense: 30 tablet; Refill: 1 - Refill DULoxetine (CYMBALTA) 30 MG capsule; Take 1 capsule (30 mg total) by mouth daily.  Dispense: 30 capsule; Refill: 3 - Refill acetaminophen-codeine (TYLENOL #3) 300-30 MG tablet; Take 1 tablet by mouth every 12 (twelve) hours as needed for moderate Morse.  Dispense: 60 tablet; Refill: 0 - We have called the manager at [facility] [place] today. Had to leave message to contact us in regard to CAFA patients. - I have skyped [name] CHW office manager for assistance in finding out if CAFA is accepted [employer] wide. No response received yet but will contact Lori Morse again [employer].  2. Depression, unspecified depression type - Refill DULoxetine (CYMBALTA) 30 MG capsule; Take 1 capsule (30 mg total) by mouth daily.  Dispense: 30 capsule; Refill: 3   Meds ordered this encounter  Medications  . meloxicam (MOBIC) 15 MG tablet    Sig: Take 1 tablet (15 mg total) by mouth daily.    Dispense:  30 tablet    Refill:  1    Order Specific Question:   Supervising Provider    Answer:   Lori Morse [[id number]]  . DULoxetine (CYMBALTA) 30 MG capsule    Sig: Take 1 capsule (30 mg total) by mouth daily.    Dispense:  30 capsule    Refill:  3    Order Specific Question:   Supervising Provider    Answer:   Lori Morse [[id number]]  . acetaminophen-codeine (TYLENOL #3) 300-30 MG tablet    Sig: Take 1 tablet by mouth every 12 (twelve) hours as needed for moderate Morse.    Dispense:  60 tablet    Refill:  0    Order Specific Question:   Supervising Provider    Answer:   Lori Morse [[id number]]    Follow-up: Return in about 4 weeks  (around 03/09/2018).   Lori Demark PA

## 2018-02-09 NOTE — Patient Instructions (Signed)
Depresin mayor (Major Depressive Disorder) La depresin mayor es un trastorno del estado de nimo. No es lo mismo que sentir tristeza cuando ocurre algo malo. Este trastorno [place]. Trabajar o hacer cosas con la familia y los amigos puede volverse difcil debido a este trastorno. La depresin mayor puede causar lo siguiente en las personas que la padecen:  Holiday representative.  Amedeo Kinsman.  Impotencia.  Irritabilidad. Adems de estos sentimientos, las personas que padecen este trastorno tienen por lo menos 4de estos sntomas:  Problemas para dormir.  Dormir demasiado.  Un cambio importante en el apetito.  Un cambio importante en el peso.  Falta de energa.  Sentimientos de culpa o inutilidad.  Dificultad para concentrarse, recordar o tomar decisiones.  Movimientos lentos.  Agitacin.  Pensamientos o sueos de suicidio, o de daarse a s mismas.  Intento de suicidio. [place] Dezza los medicamentos de venta libre y los recetados solamente como se lo haya indicado el mdico.  Consulting civil engineer a todas las visitas de control como se lo haya indicado el mdico. Esto incluye cualquier terapia que le recomiende el mdico.  Reduzca el nivel de estrs. Haga cosas que disfruta, como andar en bicicleta, caminar o leer un libro.  Haga ejercicio fsico con frecuencia.  Consuma una dieta saludable.  No beba alcohol.  No consuma drogas.  Busque el 74 de sus familiares y Biomedical engineer.  SOLICITE AYUDA DE INMEDIATO SI:  Empieza a escuchar voces.  Ve cosas que no existen.  Siente que las personas lo persiguen (paranoico).  Tiene pensamientos serios acerca de lastimarse a usted mismo o daar a Producer, television/film/video.  Piensa en el suicidio.  Esta informacin no tiene Marine scientist el consejo del mdico. Asegrese de hacerle al mdico cualquier pregunta que tenga. Document Released: 03/30/2015 Document Revised: 03/30/2015 Document Reviewed:  05/14/2012 Elsevier Interactive Patient Education  2017 [employer].

## 2018-02-19 ENCOUNTER — Ambulatory Visit: Payer: No Typology Code available for payment source | Admitting: Podiatry

## 2018-02-19 ENCOUNTER — Encounter: Payer: Self-pay | Admitting: Podiatry

## 2018-02-19 DIAGNOSIS — M722 Plantar fascial fibromatosis: Secondary | ICD-10-CM

## 2018-02-19 NOTE — Patient Instructions (Addendum)
Pre-Operative Instructions  Congratulations, you have decided to take an important step towards improving your quality of life.  You can be assured that the doctors and staff at [facility] will be with you every step of the way.  Here are some important things you should know:  1. Plan to be at the surgery center/hospital at least 1 (one) hour prior to your scheduled time, unless otherwise directed by the surgical center/hospital staff.  You must have a responsible adult accompany you, remain during the surgery and drive you home.  Make sure you have directions to the surgical center/hospital to ensure you arrive on time. 2. If you are having surgery at [facility] or [place] hospitals, you will need a copy of your medical history and physical form from your family physician within one month prior to the date of surgery. We will give you a form for your primary physician to complete.  3. We make every effort to accommodate the date you request for surgery.  However, there are times where surgery dates or times have to be moved.  We will contact you as soon as possible if a change in schedule is required.   4. No aspirin/ibuprofen for one week before surgery.  If you are on aspirin, any non-steroidal anti-inflammatory medications (Mobic, Aleve, Ibuprofen) should not be taken seven (7) days prior to your surgery.  You make take Tylenol for pain prior to surgery.  5. Medications - If you are taking daily heart and blood pressure medications, seizure, reflux, allergy, asthma, anxiety, pain or diabetes medications, make sure you notify the surgery center/hospital before the day of surgery so they can tell you which medications you should take or avoid the day of surgery. 6. No food or drink after midnight the night before surgery unless directed otherwise by surgical center/hospital staff. 7. No alcoholic beverages 24-hours prior to surgery.  No smoking 24-hours prior or 24-hours after  surgery. 8. Wear loose pants or shorts. They should be loose enough to fit over bandages, boots, and casts. 9. Don't wear slip-on shoes. Sneakers are preferred. 10. Bring your boot with you to the surgery center/hospital.  Also bring crutches or a walker if your physician has prescribed it for you.  If you do not have this equipment, it will be provided for you after surgery. 11. If you have not been contacted by the surgery center/hospital by the day before your surgery, call to confirm the date and time of your surgery. 12. Leave-time from work may vary depending on the type of surgery you have.  Appropriate arrangements should be made prior to surgery with your employer. 13. Prescriptions will be provided immediately following surgery by your doctor.  Fill these as soon as possible after surgery and take the medication as directed. Pain medications will not be refilled on weekends and must be approved by the doctor. 14. Remove nail polish on the operative foot and avoid getting pedicures prior to surgery. 15. Wash the night before surgery.  The night before surgery wash the foot and leg well with water and the antibacterial soap provided. Be sure to pay special attention to beneath the toenails and in between the toes.  Wash for at least three (3) minutes. Rinse thoroughly with water and dry well with a towel.  Perform this wash unless told not to do so by your physician.  Enclosed: 1 Ice pack (please put in freezer the night before surgery)   1 Hibiclens skin cleaner     Pre-op instructions  If you have any questions regarding the instructions, please do not hesitate to call our office.  [place]: [address], [place], [place] [address] -- [phone]  [place]: [address] [place], [place] [address] -- [phone]  [place]: [address]  [place], [place] [address] -- [phone]  [place]: [address], [place], [place] [address] -- [phone]  Website:  [email or web address] 

## 2018-02-25 NOTE — Progress Notes (Signed)
   Subjective: 52 year old female presenting today for follow up evaluation of plantar fasciitis of the right foot. She also notes a painful bunion of the right foot that has been ongoing for several months. She has been taking Mobic which helps alleviate the pain. Walking and wearing certain shoes increases the pain. She was originally supposed to have surgery with Dr. Blenda Mounts in 2012 but could not afford it. Patient is here for further evaluation and treatment.   No past medical history on file.   Objective: Physical Exam General: The patient is alert and oriented x3 in no acute distress.  Dermatology: Skin is warm, dry and supple bilateral lower extremities. Negative for open lesions or macerations bilateral.   Vascular: Dorsalis Pedis and Posterior Tibial pulses palpable bilateral.  Capillary fill time is immediate to all digits.  Neurological: Epicritic and protective threshold intact bilateral.   Musculoskeletal: Tenderness to palpation to the plantar aspect of the right heel along the plantar fascia. Clinical evidence of bunion deformity noted to the respective foot. There is a moderate pain on palpation range of motion of the first MPJ. Lateral deviation of the hallux noted consistent with hallux abductovalgus. All other joints range of motion within normal limits bilateral. Strength 5/5 in all groups bilateral.    Assessment: 1. Plantar fasciitis right 2. Bunion right foot  Plan of Care:  1. Patient evaluated. 2. Today we discussed the conservative versus surgical management of the presenting pathology. The patient opts for surgical management. All possible complications and details of the procedure were explained. All patient questions were answered. No guarantees were expressed or implied. 3. Authorization for surgery was initiated today. Surgery will consist of EPF right; bunionectomy with metatarsal osteotomy right.  4. Return to clinic one week post op.      [name], DPM [facility]  Dr. [name], DPM    [address], [place] [address]                Office [phone]  Fax [phone]

## 2018-02-27 ENCOUNTER — Telehealth: Payer: Self-pay | Admitting: *Deleted

## 2018-02-27 NOTE — Telephone Encounter (Signed)
"  I'm calling in regards to [facility].  She's giving a call here at the office due to the fact that she states she needs to have surgery, needed to call back to schedule an appointment, or that they were going to call her back with an appointment date.  She gave a call to the office stating to see if I can help her out because she doesn't speak any [place].  If you would, give me a call back."

## 2018-03-02 NOTE — Telephone Encounter (Signed)
I attempted to return Gloria's call.  She was not working at that location today.  I will call her on Monday.

## 2018-03-06 ENCOUNTER — Telehealth: Payer: Self-pay

## 2018-03-06 NOTE — Telephone Encounter (Signed)
This CM spoke to Courtney/SCAT who confirmed that she has received the SCAT application but they have not been able to reach the patient.

## 2018-03-08 NOTE — Telephone Encounter (Signed)
I left the patient a message to call me so we can schedule her appointment.

## 2018-03-09 ENCOUNTER — Ambulatory Visit (INDEPENDENT_AMBULATORY_CARE_PROVIDER_SITE_OTHER): Payer: Self-pay | Admitting: Physician Assistant

## 2018-03-09 ENCOUNTER — Other Ambulatory Visit: Payer: Self-pay

## 2018-03-09 ENCOUNTER — Encounter (INDEPENDENT_AMBULATORY_CARE_PROVIDER_SITE_OTHER): Payer: Self-pay | Admitting: Physician Assistant

## 2018-03-09 VITALS — BP 118/72 | HR 64 | Temp 98.0°F | Ht 62.0 in | Wt 180.6 lb

## 2018-03-09 DIAGNOSIS — Z760 Encounter for issue of repeat prescription: Secondary | ICD-10-CM

## 2018-03-09 DIAGNOSIS — F329 Major depressive disorder, single episode, unspecified: Secondary | ICD-10-CM

## 2018-03-09 DIAGNOSIS — M722 Plantar fascial fibromatosis: Secondary | ICD-10-CM

## 2018-03-09 DIAGNOSIS — M21612 Bunion of left foot: Secondary | ICD-10-CM

## 2018-03-09 DIAGNOSIS — F32A Depression, unspecified: Secondary | ICD-10-CM

## 2018-03-09 MED ORDER — LOVASTATIN 20 MG PO TABS: 20.0000 mg | ORAL_TABLET | Freq: Every day | ORAL | 5 refills | Status: DC

## 2018-03-09 MED ORDER — MELOXICAM 15 MG PO TABS: 15.0000 mg | ORAL_TABLET | Freq: Every day | ORAL | 0 refills | Status: DC

## 2018-03-09 NOTE — Patient Instructions (Signed)
Juanete (Bunion) Un juanete es una protuberancia en la base del dedo gordo del pie que se forma cuando los huesos de la articulacin del dedo se desplazan de su posicin. Lo juanetes pueden ser pequeos al principio, aunque generalmente se agrandan con el transcurso del [place]. Pueden causar dolor al caminar. [place] causas del [place], se incluyen las siguientes:  Usar zapatos angostos o puntiagudos que obligan al dedo gordo a presionar ArvinMeritor otros dedos.  Un desarrollo anormal del pie causado por caer hacia adentro (pronacin).  Los Smith International son causados por ciertas enfermedades, como la artritis reumatoide y la poliomielitis.  Una lesin en el pie. [place] pueden hacer que usted sea propenso a sufrir esta afeccin:  Usar zapatos que comprimen los dedos del pie.  Tener ciertas enfermedades, tales como: ? Artritis reumatoide. ? Poliomielitis. ? Parlisis cerebral.  Tener antecedentes familiares de juanete.  Haber nacido con una deformidad en el pie, como por ejemplo, pie plano o arco vencido.  Hacer actividades que presionan Assurant, Soil scientist. SNTOMAS El principal sntoma de un juanete es una protuberancia notable en el dedo gordo. Otros sntomas pueden ser los siguientes:  Social research officer, government.  Hinchazn alrededor del dedo gordo.  Enrojecimiento e inflamacin.  Piel gruesa o endurecida en el dedo gordo o entre los dedos del pie.  Rigidez o prdida del movimiento del dedo gordo.  Dificultad para caminar. DIAGNSTICO Un juanete se puede diagnosticar en funcin de los sntomas, los antecedentes mdicos y las actividades que Musician. Pueden hacerle estudios, por ejemplo:  Radiografas. Estas permiten al mdico evaluar la posicin de los News Corporation y Hydrographic surveyor si hay dao en la articulacin. Tambin permiten al MeadWestvaco determinar la gravedad del juanete y la mejor [place] de tratarlo.  Aspiracin de una  articulacin. En Hughes Supply, se extrae Truddie Coco del fluido de la articulacin del dedo del pie. Tenneco Inc, que se puede hacer si tiene Geophysical data processor, ayuda a Hydrographic surveyor enfermedades que causan la hinchazn dolorosa de las articulaciones, como la artritis. TRATAMIENTO Un juanete no tiene [place], pero el tratamiento puede ayudar a evitar que el juanete empeore. El tratamiento variar segn la gravedad de los sntomas. El mdico podr indicar lo siguiente:  Usar zapatos que tengan puntera ancha para los dedos.  Usar protectores para juanetes que sirven para Mining engineer.  Vendar los dedos juntos para mantenerlos en una posicin normal.  Colocar un dispositivo adentro del zapato (ortesis) para reducir la presin en la articulacin del dedo.  Tomar medicamentos para Best boy, la inflamacin y la hinchazn.  Aplicar calor o hielo en la zona afectada.  Hacer ejercicios de elongacin.  Clementeen Hoof para eliminar el tejido cicatrizal y Publishing rights manager los dedos a su posicin normal. Este tratamiento es poco frecuente. INSTRUCCIONES PARA EL CUIDADO EN EL HOGAR  Para dar soporte a la articulacin del dedo, use calzado adecuado, plantillas o vendas como se lo haya indicado el mdico.  Tome los medicamentos de venta libre y los recetados solamente como se lo haya indicado el mdico.  Si se lo indican, aplique hielo sobre la zona lesionada: ? Field seismologist hielo en una bolsa plstica. ? Coloque una Genuine Parts piel y la bolsa de hielo. ? Coloque el hielo durante 52minutos, 2 a 3veces por da.  Si se lo indican, aplique calor en la zona afectada antes de realizar ejercicios. Use la fuente de calor que el Viacom  recomiende, como una compresa de calor hmedo o una almohadilla trmica. ? Coloque una Genuine Parts piel y la fuente de Freight forwarder. ? Aplique el calor durante 20 a 39minutos. ? Retire la fuente de calor si la piel se le pone de color rojo brillante. Esto es muy  importante si no puede sentir el dolor, el calor o el fro. Puede correr un riesgo mayor de sufrir quemaduras.  Haga ejercicios como se lo haya indicado el mdico.  Concurra a todas las visitas de control como se lo haya indicado el mdico. SOLICITE ATENCIN MDICA SI:  Los sntomas empeoran.  Los sntomas no mejoran en el trmino de 2 semanas. SOLICITE ATENCIN MDICA DE INMEDIATO SI:  Tiene mucho dolor o dificultad al caminar. Esta informacin no tiene Marine scientist el consejo del mdico. Asegrese de hacerle al mdico cualquier pregunta que tenga. Document Released: 04/18/2005 Document Revised: 07/11/2011 Document Reviewed: 11/16/2014 Elsevier Interactive Patient Education  [employer].

## 2018-03-09 NOTE — Telephone Encounter (Signed)
    I called Lori Morse from [facility] to call the patient in regards to scheduling her a surgery date.  He left her a message to call me.  I asked what I need to do once she calls me back.  He said for me to call him on three-way.

## 2018-03-09 NOTE — Progress Notes (Signed)
Subjective:  Patient ID: Lori Morse, female    DOB: Apr 06, 1966  Age: 52 y.o. MRN: [id number]  CC: f/u plantar fasciitis and depression  HPI Lori Morse a 52 y.o.femalewith a medical history ofPTSD,plantar fasciitis right foot and sciatica left side presentson f/u of plantar fasciitis. She was seen by Lori Morse which discussed conservative treatment vs surgery for plantar fasciitis. Pt opted for surgical management and Lori Morse will likely proceed with EPF right and bunionectomy with metatarsal osteotomy. Pt presents with a bag from [facility] and does not know what it is for. Bag contains instruction pamphlet, gel pack, and scrub. She does not understand English and therefore has not been able to read pamphlet. Pt also received a message in English from [facility] but does not understand what it says. Still does not know what day and time her surgery will occur. Pt is grateful for the care given by her doctors at [facility] but frustrated with the lack or difficulty of obtaining interpreter services in [place].     States duloxetine has been helpful in reducing her depression. She used to want to be isolated and left alone. Had little interest in hobbies or activities. She now feels renewed hope in being able to enjoy life again. Says the combination of duloxetine and Lori Morse plan to perform surgery has lifted her mood. Pt has a bunion on the left foot and plans to have Lori Morse evaluate her for surgical correction at a later date.      Outpatient Medications Prior to Visit  Medication Sig Dispense Refill  . acetaminophen-codeine (TYLENOL #3) 300-30 MG tablet Take 1 tablet by mouth every 12 (twelve) hours as needed for moderate pain. 60 tablet 0  . DULoxetine (CYMBALTA) 30 MG capsule Take 1 capsule (30 mg total) by mouth daily. 30 capsule 3  . lovastatin (MEVACOR) 20 MG tablet Take 1 tablet (20 mg total) by mouth at bedtime. 90  tablet 3  . meloxicam (MOBIC) 15 MG tablet Take 1 tablet (15 mg total) by mouth daily. 30 tablet 1   No facility-administered medications prior to visit.      ROS Review of Systems  Constitutional: Negative for chills, fever and malaise/fatigue.  Eyes: Negative for blurred vision.  Respiratory: Negative for shortness of breath.   Cardiovascular: Negative for chest pain and palpitations.  Gastrointestinal: Negative for abdominal pain and nausea.  Genitourinary: Negative for dysuria and hematuria.  Musculoskeletal: Negative for joint pain and myalgias.       Bilateral foot pain  Skin: Negative for rash.  Neurological: Negative for tingling and headaches.  Psychiatric/Behavioral: Negative for depression. The patient is not nervous/anxious.     Objective:  BP 118/72 (BP Location: Left Arm, Patient Position: Sitting, Cuff Size: Normal)   Pulse 64   Temp 98 F (36.7 C) (Oral)   Ht 5\' 2"  (1.575 m)   Wt 180 lb 9.6 oz (81.9 kg)   LMP 02/24/2018 (Exact Date)   SpO2 97%   BMI 33.03 kg/m   BP/Weight 03/09/2018 12/45/8099 12/03/3823  Systolic BP 053 976 734  Diastolic BP 72 77 78  Wt. (Lbs) 180.6 182.2 183.6  BMI 33.03 33.32 33.58      Physical Exam  Constitutional: She is oriented to person, place, and time.  Well developed, well nourished, NAD, polite  HENT:  Head: Normocephalic and atraumatic.  Eyes: No scleral icterus.  Neck: Normal range of motion. Neck supple.  No thyromegaly present.  Cardiovascular: Normal rate, regular rhythm and normal heart sounds.  Pulmonary/Chest: Effort normal and breath sounds normal.  Musculoskeletal: She exhibits no edema.  Bunion of first metatarsal of hallux bilaterally. TTP along the right calcaneus at the insertion of plantar fascia.   Neurological: She is alert and oriented to person, place, and time.  Skin: Skin is warm and dry. No rash noted. No erythema. No pallor.  Psychiatric: She has a normal mood and affect. Her behavior is normal.  Thought content normal.  Vitals reviewed.    Assessment & Plan:   1. Plantar fasciitis of right foot - We have called today and left message to practice administrator of [facility] for a call back to settle language interpreter service issue we have been having with their office. Unfortunately, we have repeatedly been ignored or forgotten by [facility] about the interpretation issues patient is facing. As a result, the office of inclusion was called to settle the issues patient has had with [facility]. Pt received a call from a spanish interpreter within 10 minutes of our call to the office of inclusion. Pt set to have surgery on 05/17/18. CAFA will expire on 04/10/18. I have advised pt to call Lori Morse at [facility] to renew CAFA in a timely manner.  - Begin meloxicam (MOBIC) 15 MG tablet; Take 1 tablet (15 mg total) by mouth daily.  Dispense: 30 tablet; Refill: 0  2. Depression, unspecified depression type - Seems to be responding well to Cymbalta 30 mg. Advised to continue taking Cymbalta as directed.   3. Bunion, left foot - Advised pt to have Lori Morse evaluate her left foot after surgery and rehabilitation of the right foot is complete.   4. Medication refill - lovastatin (MEVACOR) 20 MG tablet; Take 1 tablet (20 mg total) by mouth at bedtime.  Dispense: 30 tablet; Refill: 5   Meds ordered this encounter  Medications  . meloxicam (MOBIC) 15 MG tablet    Sig: Take 1 tablet (15 mg total) by mouth daily.    Dispense:  30 tablet    Refill:  0    Order Specific Question:   Supervising Provider    Answer:   Lori Morse [[id number]]  . lovastatin (MEVACOR) 20 MG tablet    Sig: Take 1 tablet (20 mg total) by mouth at bedtime.    Dispense:  30 tablet    Refill:  5    Order Specific Question:   Supervising Provider    Answer:   Lori Morse [[id number]]    Follow-up: Return if symptoms worsen or fail to improve.   Lori Morse

## 2018-03-14 NOTE — Telephone Encounter (Signed)
I called Lori Morse to interpret a call to Lori Morse.  I informed her that she will need to have a history and physical form completed by her primary care doctor 30 days prior to her surgery date, which is scheduled for 05/17/2018.  I asked her to come by the office if she does not have the history and physical form to get one.  She said she does not drive so she would have to have her daughter or someone else bring her.  She also stated to she works four hours a day.  I made her aware that if the form is not completed within 30 days of surgery, the surgery will be canceled.  She said she understood.  I left the forms at the front desk.  I will also fax it to Dr. Altamease Oiler.

## 2018-03-14 NOTE — Telephone Encounter (Signed)
I faxed the history and physical form to Dr. Randell Loop office.  I informed them that the patient will need to see Dr. Altamease Oiler within 30 days prior to the surgery date of 05/17/2017.  I also put a copy of the forms at our front desk for the patient to pick up.

## 2018-04-06 ENCOUNTER — Ambulatory Visit: Payer: Self-pay | Attending: Family Medicine

## 2018-04-06 MED FILL — MELOXICAM 15 MG TABLET: 15 | 30 days supply | Qty: 30 | Fill #0

## 2018-04-06 MED FILL — DULoxetine HCL 30 MG CPEP: 30 | 30 days supply | Qty: 30 | Fill #1

## 2018-04-06 MED FILL — LOVASTATIN 20 MG TABLET: 20 | 30 days supply | Qty: 30 | Fill #0

## 2018-04-11 ENCOUNTER — Ambulatory Visit: Payer: Self-pay | Attending: Family Medicine

## 2018-04-17 ENCOUNTER — Ambulatory Visit (HOSPITAL_COMMUNITY)
Admission: RE | Admit: 2018-04-17 | Discharge: 2018-04-17 | Disposition: A | Discharge status: Home / Self Care | Payer: Self-pay | Source: Ambulatory Visit | Attending: Physician Assistant | Admitting: Physician Assistant

## 2018-04-17 ENCOUNTER — Ambulatory Visit (INDEPENDENT_AMBULATORY_CARE_PROVIDER_SITE_OTHER): Payer: Self-pay | Admitting: Physician Assistant

## 2018-04-17 ENCOUNTER — Encounter (INDEPENDENT_AMBULATORY_CARE_PROVIDER_SITE_OTHER): Payer: Self-pay | Admitting: Physician Assistant

## 2018-04-17 ENCOUNTER — Other Ambulatory Visit (INDEPENDENT_AMBULATORY_CARE_PROVIDER_SITE_OTHER): Payer: Self-pay | Admitting: Physician Assistant

## 2018-04-17 ENCOUNTER — Other Ambulatory Visit: Payer: Self-pay

## 2018-04-17 VITALS — BP 111/73 | HR 60 | Temp 97.9°F | Ht 62.0 in | Wt 179.2 lb

## 2018-04-17 DIAGNOSIS — R918 Other nonspecific abnormal finding of lung field: Secondary | ICD-10-CM

## 2018-04-17 DIAGNOSIS — M722 Plantar fascial fibromatosis: Secondary | ICD-10-CM

## 2018-04-17 DIAGNOSIS — Z01818 Encounter for other preprocedural examination: Secondary | ICD-10-CM | POA: Insufficient documentation

## 2018-04-17 DIAGNOSIS — F329 Major depressive disorder, single episode, unspecified: Secondary | ICD-10-CM

## 2018-04-17 DIAGNOSIS — F32A Depression, unspecified: Secondary | ICD-10-CM

## 2018-04-17 HISTORY — DX: Other nonspecific abnormal finding of lung field: R91.8

## 2018-04-17 MED ORDER — ACETAMINOPHEN-CODEINE #3 300-30 MG PO TABS: 1.0000 | ORAL_TABLET | ORAL | 0 refills | Status: AC | PRN

## 2018-04-17 MED ORDER — MELOXICAM 15 MG PO TABS: 15.0000 mg | ORAL_TABLET | Freq: Every day | ORAL | 0 refills | Status: DC

## 2018-04-17 MED ORDER — DULOXETINE HCL 30 MG PO CPEP: 30.0000 mg | ORAL_CAPSULE | Freq: Every day | ORAL | 1 refills | Status: DC

## 2018-04-17 NOTE — H&P (View-Only) (Signed)
Subjective:  Patient ID: Lori Morse, female    DOB: 08/20/1965  Age: 52 y.o. MRN: [id number]  CC: annual exam  HPI  Lori Switalski Huertais a 52 y.o.femalewith a medical history of PTSD,Depression, Plantar fasciitis right foot, and sciatica left side presents for pre-operative examination. She is scheduled to undergo outpatient surgery on 05/17/2018 for plantar fasciitis of the right foot. Patient complains of right plantar fasciitis pain with mild swelling. Pain currently rated 7/10. Takes Tylenol #3, Meloxicam, and Duloxetine with moderate but temporary relief of plantar fascia pain. Reports taking Tylenol #3 sporadically and still has four pills left over from last months 1 week prescription. Otherwise, she is feeling well without any complaint of malaise, CP, SOB, HA, tingling, numbness, abdominal pain, f/c/n/v, rash, or GI/GU sxs.    Outpatient Medications Prior to Visit  Medication Sig Dispense Refill  . DULoxetine (CYMBALTA) 30 MG capsule Take 1 capsule (30 mg total) by mouth daily. 30 capsule 3  . lovastatin (MEVACOR) 20 MG tablet Take 1 tablet (20 mg total) by mouth at bedtime. 30 tablet 5  . meloxicam (MOBIC) 15 MG tablet Take 1 tablet (15 mg total) by mouth daily. 30 tablet 0  . acetaminophen-codeine (TYLENOL #3) 300-30 MG tablet Take 1 tablet by mouth every 12 (twelve) hours as needed for moderate pain. 60 tablet 0   No facility-administered medications prior to visit.      ROS Review of Systems  Constitutional: Negative for chills, fever and malaise/fatigue.  Eyes: Negative for blurred vision.  Respiratory: Negative for shortness of breath.   Cardiovascular: Negative for chest pain and palpitations.  Gastrointestinal: Negative for abdominal pain and nausea.  Genitourinary: Negative for dysuria and hematuria.  Musculoskeletal: Negative for joint pain and myalgias.       Right foot pain  Skin: Negative for rash.  Neurological: Negative for tingling and  headaches.  Psychiatric/Behavioral: Positive for depression. Negative for substance abuse and suicidal ideas. The patient is not nervous/anxious.     Objective:  BP 111/73 (BP Location: Left Arm, Patient Position: Sitting, Cuff Size: Normal)   Pulse 60   Temp 97.9 F (36.6 C) (Oral)   Ht 5\' 2"  (1.575 m)   Wt 179 lb 3.2 oz (81.3 kg)   LMP 04/07/2018 (Exact Date)   SpO2 93%   BMI 32.78 kg/m   BP/Weight 04/17/2018 03/09/2018 26/83/4196  Systolic BP 222 979 892  Diastolic BP 73 72 77  Wt. (Lbs) 179.2 180.6 182.2  BMI 32.78 33.03 33.32      Physical Exam Vitals signs reviewed.  Constitutional:      Comments: Well developed, overweight, NAD, polite  HENT:     Head: Normocephalic and atraumatic.  Eyes:     General: No scleral icterus. Neck:     Musculoskeletal: Normal range of motion and neck supple.     Thyroid: No thyromegaly.  Cardiovascular:     Rate and Rhythm: Normal rate and regular rhythm.     Heart sounds: Normal heart sounds.  Pulmonary:     Effort: Pulmonary effort is normal.     Breath sounds: Normal breath sounds.  Abdominal:     General: Bowel sounds are normal.     Palpations: Abdomen is soft.     Tenderness: There is no abdominal tenderness.  Musculoskeletal: Normal range of motion.        General: No swelling or deformity.     Comments: Right heel with mild tenderness to palpation along the insertion of the  plantar fascia.   Skin:    General: Skin is warm and dry.     Coloration: Skin is not pale.     Findings: No erythema or rash.  Neurological:     Mental Status: She is alert and oriented to person, place, and time.     Comments: Mildly antalgic gait favoring right side.   Psychiatric:        Behavior: Behavior normal.        Thought Content: Thought content normal.      Assessment & Plan:    1. Preoperative examination - CBC with Differential - Comprehensive metabolic panel - PT AND PTT - DG Chest 2 View; Future - EKG 12-Lead  *  Review of infectious disease screening up to date for age.  * Review of family history, surgical history, and medical history noncontributory * Pt has never used tobacco products, no drug use, and no alcohol use.     Follow-up:  After surgery.   Clent Demark PA

## 2018-04-17 NOTE — Patient Instructions (Signed)
Fasciotoma plantar abierta, cuidados posteriores (Open Plantar Fasciotomy, Care After) Siga estas instrucciones durante las prximas semanas. Estas indicaciones le proporcionan informacin acerca de cmo deber cuidarse despus del procedimiento. El mdico tambin podr darle instrucciones ms especficas. El tratamiento ha sido planificado segn las prcticas mdicas actuales, pero en algunos casos pueden ocurrir problemas. Comunquese con el mdico si tiene algn problema o dudas despus del procedimiento. QU ESPERAR DESPUS DEL PROCEDIMIENTO Despus del procedimiento, es comn [employer] siguientes sntomas:  Social research officer, government.  Hinchazn.  Rigidez. INSTRUCCIONES PARA EL CUIDADO EN EL HOGAR Cuidado de la incisin  Siga las indicaciones del mdico acerca del cuidado de la incisin. Haga lo siguiente: ? Lvese las manos con agua y jabn antes de Quarry manager las vendas (vendaje). Use desinfectante para manos si no dispone de [place]. ? Cambie el vendaje como se lo haya indicado el mdico. ? No retire los puntos (suturas), el [employer] para la piel o las tiras [place]. Es posible que estos deban quedar puestos en la piel durante 2semanas o ms tiempo. Si los bordes de las tiras adhesivas empiezan a despegarse y Therapist, sports, puede recortar los que estn sueltos. No retire las tiras [facility] por completo a menos que el mdico se lo indique.  Fleming zona de la incisin para detectar signos de infeccin. Est atento a los siguientes signos: ? Aumento del enrojecimiento, la hinchazn o Conservation officer, historic buildings. ? Ms lquido Delorise Shiner. ? Calor. ? Pus o mal olor. Control del dolor y la hinchazn  Si se lo indican, aplique hielo en el pie afectado: ? Ponga el hielo en una bolsa plstica. ? Coloque una [employer] piel y la bolsa de hielo. ? Coloque el hielo durante 51minutos, 2 a 3veces por da.  Cuando est sentado o acostado, levante (eleve) el pie por encima del nivel del  corazn. Conducir  No conduzca durante 24horas si le administraron un sedante.  No conduzca ni opere maquinaria pesada mientras toma analgsicos recetados.  Pregntele a su mdico cundo Adult nurse a conducir vehculos si tiene una frula, una bota o un zapato ortopdicos en el pie que [place] para conducir. Actividad  No apoye el peso del cuerpo sobre el pie lesionado hasta que lo autorice el mdico. Use muletas como se lo haya indicado el mdico.  No realice ninguna actividad que le cause dolor en el pie.  Reanude sus actividades normales como se lo haya indicado el mdico. Pregntele al mdico qu actividades son seguras para usted. El bao  No tome baos de inmersin, no nade ni use el jacuzzi hasta que el mdico lo autorice. Pregntele al mdico si puede ducharse.  Si el mdico autoriza que se bae y se duche, Quarry manager el pie fuera de la baera o cubra la venda o la frula con una bolsa de plstico hermtica para protegerlas del agua. No deje que la venda o la frula se mojen.  Mantenga la venda (vendaje) seca hasta que el mdico le diga que se la puede quitar. Instrucciones generales  [employer] de venta libre y los recetados solamente como se lo haya indicado el mdico.  Use la frula, bota o zapato ortopdicos como se lo haya indicado el mdico.  Concurra a todas las visitas de control como se lo haya indicado el mdico. Esto es importante. SOLICITE ATENCIN MDICA SI:  Jaclynn Guarneri.  Tiene dolor o hinchazn en el pie que empeoran.  Aumentan el enrojecimiento, la hinchazn o Conservation officer, historic buildings alrededor de la  incisin.  Le sale ms lquido o sangre de la incisin.  La incisin est caliente al tacto.  Tiene pus o percibe que sale mal olor del lugar de la incisin.  Siente adormecimiento en el pie. SOLICITE ATENCIN MDICA DE INMEDIATO SI:  Siente dolor o calor en la zona de la pantorrilla, y la tiene hinchada.  Siente dolor en el pecho.  Tiene dificultad para  respirar. Esta informacin no tiene Marine scientist el consejo del mdico. Asegrese de hacerle al mdico cualquier pregunta que tenga. Document Released: 03/30/2015 Document Revised: 03/30/2015 Document Reviewed: 10/27/2014 Elsevier Interactive Patient Education  [employer].

## 2018-04-17 NOTE — Progress Notes (Signed)
Subjective:  Patient ID: Lori Morse, female    DOB: 1966-04-30  Age: 52 y.o. MRN: [id number]  CC: annual exam  HPI  Lori Line Huertais a 52 y.o.femalewith a medical history of PTSD,Depression, Plantar fasciitis right foot, and sciatica left side presents for pre-operative examination. She is scheduled to undergo outpatient surgery on 05/17/2018 for plantar fasciitis of the right foot. Patient complains of right plantar fasciitis pain with mild swelling. Pain currently rated 7/10. Takes Tylenol #3, Meloxicam, and Duloxetine with moderate but temporary relief of plantar fascia pain. Reports taking Tylenol #3 sporadically and still has four pills left over from last months 1 week prescription. Otherwise, she is feeling well without any complaint of malaise, CP, SOB, HA, tingling, numbness, abdominal pain, f/c/n/v, rash, or GI/GU sxs.    Outpatient Medications Prior to Visit  Medication Sig Dispense Refill  . DULoxetine (CYMBALTA) 30 MG capsule Take 1 capsule (30 mg total) by mouth daily. 30 capsule 3  . lovastatin (MEVACOR) 20 MG tablet Take 1 tablet (20 mg total) by mouth at bedtime. 30 tablet 5  . meloxicam (MOBIC) 15 MG tablet Take 1 tablet (15 mg total) by mouth daily. 30 tablet 0  . acetaminophen-codeine (TYLENOL #3) 300-30 MG tablet Take 1 tablet by mouth every 12 (twelve) hours as needed for moderate pain. 60 tablet 0   No facility-administered medications prior to visit.      ROS Review of Systems  Constitutional: Negative for chills, fever and malaise/fatigue.  Eyes: Negative for blurred vision.  Respiratory: Negative for shortness of breath.   Cardiovascular: Negative for chest pain and palpitations.  Gastrointestinal: Negative for abdominal pain and nausea.  Genitourinary: Negative for dysuria and hematuria.  Musculoskeletal: Negative for joint pain and myalgias.       Right foot pain  Skin: Negative for rash.  Neurological: Negative for tingling and  headaches.  Psychiatric/Behavioral: Positive for depression. Negative for substance abuse and suicidal ideas. The patient is not nervous/anxious.     Objective:  BP 111/73 (BP Location: Left Arm, Patient Position: Sitting, Cuff Size: Normal)   Pulse 60   Temp 97.9 F (36.6 C) (Oral)   Ht 5\' 2"  (1.575 m)   Wt 179 lb 3.2 oz (81.3 kg)   LMP 04/07/2018 (Exact Date)   SpO2 93%   BMI 32.78 kg/m   BP/Weight 04/17/2018 03/09/2018 20/25/4270  Systolic BP 623 762 831  Diastolic BP 73 72 77  Wt. (Lbs) 179.2 180.6 182.2  BMI 32.78 33.03 33.32      Physical Exam Vitals signs reviewed.  Constitutional:      Comments: Well developed, overweight, NAD, polite  HENT:     Head: Normocephalic and atraumatic.  Eyes:     General: No scleral icterus. Neck:     Musculoskeletal: Normal range of motion and neck supple.     Thyroid: No thyromegaly.  Cardiovascular:     Rate and Rhythm: Normal rate and regular rhythm.     Heart sounds: Normal heart sounds.  Pulmonary:     Effort: Pulmonary effort is normal.     Breath sounds: Normal breath sounds.  Abdominal:     General: Bowel sounds are normal.     Palpations: Abdomen is soft.     Tenderness: There is no abdominal tenderness.  Musculoskeletal: Normal range of motion.        General: No swelling or deformity.     Comments: Right heel with mild tenderness to palpation along the insertion of the  plantar fascia.   Skin:    General: Skin is warm and dry.     Coloration: Skin is not pale.     Findings: No erythema or rash.  Neurological:     Mental Status: She is alert and oriented to person, place, and time.     Comments: Mildly antalgic gait favoring right side.   Psychiatric:        Behavior: Behavior normal.        Thought Content: Thought content normal.      Assessment & Plan:    1. Preoperative examination - CBC with Differential - Comprehensive metabolic panel - PT AND PTT - DG Chest 2 View; Future - EKG 12-Lead  *  Review of infectious disease screening up to date for age.  * Review of family history, surgical history, and medical history noncontributory * Pt has never used tobacco products, no drug use, and no alcohol use.     Follow-up:  After surgery.   Clent Demark PA

## 2018-04-18 LAB — COMPREHENSIVE METABOLIC PANEL
A/G RATIO: 1.6 (ref 1.2–2.2)
ALBUMIN: 4.5 g/dL (ref 3.5–5.5)
ALK PHOS: 85 IU/L (ref 39–117)
ALT: 15 IU/L (ref 0–32)
AST: 19 IU/L (ref 0–40)
BILIRUBIN TOTAL: 0.4 mg/dL (ref 0.0–1.2)
BUN / CREAT RATIO: 23 (ref 9–23)
BUN: 16 mg/dL (ref 6–24)
CHLORIDE: 104 mmol/L (ref 96–106)
CO2: 23 mmol/L (ref 20–29)
Calcium: 9.4 mg/dL (ref 8.7–10.2)
Creatinine, Ser: 0.71 mg/dL (ref 0.57–1.00)
GFR calc non Af Amer: 100 mL/min/{1.73_m2} (ref >59)
GFR, EST AFRICAN AMERICAN: 115 mL/min/{1.73_m2} (ref >59)
Globulin, Total: 2.8 g/dL (ref 1.5–4.5)
Glucose: 89 mg/dL (ref 65–99)
POTASSIUM: 4 mmol/L (ref 3.5–5.2)
SODIUM: 142 mmol/L (ref 134–144)
TOTAL PROTEIN: 7.3 g/dL (ref 6.0–8.5)

## 2018-04-18 LAB — CBC WITH DIFFERENTIAL/PLATELET
BASOS ABS: 0 10*3/uL (ref 0.0–0.2)
BASOS: 1 %
EOS (ABSOLUTE): 0.2 10*3/uL (ref 0.0–0.4)
Eos: 4 %
HEMOGLOBIN: 13.9 g/dL (ref 11.1–15.9)
Hematocrit: 41.9 % (ref 34.0–46.6)
IMMATURE GRANS (ABS): 0 10*3/uL (ref 0.0–0.1)
Immature Granulocytes: 0 %
LYMPHS: 43 %
Lymphocytes Absolute: 2.1 10*3/uL (ref 0.7–3.1)
MCH: 29.6 pg (ref 26.6–33.0)
MCHC: 33.2 g/dL (ref 31.5–35.7)
MCV: 89 fL (ref 79–97)
MONOCYTES: 9 %
Monocytes Absolute: 0.4 10*3/uL (ref 0.1–0.9)
NEUTROS ABS: 2.2 10*3/uL (ref 1.4–7.0)
Neutrophils: 43 %
Platelets: 248 10*3/uL (ref 150–450)
RBC: 4.69 x10E6/uL (ref 3.77–5.28)
RDW: 12.6 % (ref 12.3–15.4)
WBC: 5 10*3/uL (ref 3.4–10.8)

## 2018-04-18 LAB — PT AND PTT
INR: 1 (ref 0.8–1.2)
Prothrombin Time: 10.7 s (ref 9.1–12.0)
aPTT: 27 s (ref 24–33)

## 2018-04-19 ENCOUNTER — Other Ambulatory Visit (INDEPENDENT_AMBULATORY_CARE_PROVIDER_SITE_OTHER): Payer: Self-pay | Admitting: Physician Assistant

## 2018-04-19 ENCOUNTER — Telehealth (INDEPENDENT_AMBULATORY_CARE_PROVIDER_SITE_OTHER): Payer: Self-pay | Admitting: Physician Assistant

## 2018-04-19 ENCOUNTER — Telehealth (INDEPENDENT_AMBULATORY_CARE_PROVIDER_SITE_OTHER): Payer: Self-pay

## 2018-04-19 NOTE — Telephone Encounter (Signed)
-----   Message from Clent Demark, PA-C sent at 04/17/2018  4:46 PM EST ----- Has nodules in both sides, we do not know what these nodules are at this point. I have ordered CT chest (please contact patient and coordinate CT with her) I don't see this as an impediment to plantar fasciitis surgery.

## 2018-04-19 NOTE — Telephone Encounter (Signed)
Call placed using pacific interpreter [phone]) left voicemail notifying patient that labs are normal. PCP will write medical clearance and fax to [facility]. Xr reveals nodules. It is not known what the nodules are at this point. PCP does not see this as an impediment to having the surgery. However, CT has been ordered please call our office to give any time specifications for CT so that it may be scheduled. Nat Christen, CMA

## 2018-04-19 NOTE — Telephone Encounter (Signed)
Patient called and stated she can go for CT at any time or day.  Please follow up [phone]  Thank you Emmit Pomfret

## 2018-04-23 NOTE — Telephone Encounter (Signed)
Patient is aware of the appointment.  Lori Morse

## 2018-04-23 NOTE — Telephone Encounter (Signed)
Would you please call and notify patient that CT is scheduled for December 31 at 2:45 at [facility]. Thank you. Nat Christen, CMA

## 2018-04-30 ENCOUNTER — Other Ambulatory Visit: Payer: Self-pay

## 2018-04-30 ENCOUNTER — Encounter (HOSPITAL_BASED_OUTPATIENT_CLINIC_OR_DEPARTMENT_OTHER): Payer: Self-pay

## 2018-04-30 NOTE — Progress Notes (Signed)
Administrator, sports Requested for [employer] with:  Lianette NPO:  No food after midnight/Clear liquids until 7:30AM DOS H&P: Present on chart Dr. Altamease Oiler 04/17/2018 in epic Arrival time:  1130 AM  Labs: (CBC/diff, CMP, PT, PTT 04/17/2018 in epic)  AM medications: Duloxetine Pre op orders: No Ride home:  Lori Morse (daughter) [phone]

## 2018-05-01 ENCOUNTER — Ambulatory Visit (HOSPITAL_COMMUNITY)
Admission: RE | Admit: 2018-05-01 | Discharge: 2018-05-01 | Disposition: A | Discharge status: Home / Self Care | Payer: Self-pay | Source: Ambulatory Visit | Attending: Physician Assistant | Admitting: Physician Assistant

## 2018-05-01 DIAGNOSIS — R918 Other nonspecific abnormal finding of lung field: Secondary | ICD-10-CM

## 2018-05-07 ENCOUNTER — Telehealth: Payer: Self-pay | Admitting: *Deleted

## 2018-05-07 NOTE — Telephone Encounter (Signed)
"  We need Dr. Amalia Hailey to put orders in for [facility].  She's scheduled for surgery on January 16."  I'll let him know.

## 2018-05-15 ENCOUNTER — Telehealth: Payer: Self-pay | Admitting: *Deleted

## 2018-05-15 NOTE — Telephone Encounter (Signed)
[name] from [facility] called again for orders for Lori Morse.  She's scheduled for surgery on 05/17/2018.

## 2018-05-17 ENCOUNTER — Ambulatory Visit (HOSPITAL_BASED_OUTPATIENT_CLINIC_OR_DEPARTMENT_OTHER): Payer: Self-pay | Admitting: Anesthesiology

## 2018-05-17 ENCOUNTER — Encounter (HOSPITAL_BASED_OUTPATIENT_CLINIC_OR_DEPARTMENT_OTHER): Payer: Self-pay | Admitting: Anesthesiology

## 2018-05-17 ENCOUNTER — Ambulatory Visit (HOSPITAL_BASED_OUTPATIENT_CLINIC_OR_DEPARTMENT_OTHER)
Admission: RE | Admit: 2018-05-17 | Discharge: 2018-05-17 | Disposition: A | Discharge status: Home / Self Care | Payer: Self-pay | Attending: Podiatry | Admitting: Podiatry

## 2018-05-17 ENCOUNTER — Encounter (HOSPITAL_BASED_OUTPATIENT_CLINIC_OR_DEPARTMENT_OTHER)
Admission: RE | Disposition: A | Discharge status: Home / Self Care | Payer: Self-pay | Source: Home / Self Care | Attending: Podiatry

## 2018-05-17 ENCOUNTER — Other Ambulatory Visit: Payer: Self-pay

## 2018-05-17 ENCOUNTER — Encounter: Payer: Self-pay | Admitting: Podiatry

## 2018-05-17 DIAGNOSIS — M2011 Hallux valgus (acquired), right foot: Secondary | ICD-10-CM

## 2018-05-17 DIAGNOSIS — E785 Hyperlipidemia, unspecified: Secondary | ICD-10-CM | POA: Insufficient documentation

## 2018-05-17 DIAGNOSIS — M722 Plantar fascial fibromatosis: Secondary | ICD-10-CM | POA: Insufficient documentation

## 2018-05-17 DIAGNOSIS — Z79899 Other long term (current) drug therapy: Secondary | ICD-10-CM | POA: Insufficient documentation

## 2018-05-17 DIAGNOSIS — F329 Major depressive disorder, single episode, unspecified: Secondary | ICD-10-CM | POA: Insufficient documentation

## 2018-05-17 DIAGNOSIS — D492 Neoplasm of unspecified behavior of bone, soft tissue, and skin: Secondary | ICD-10-CM

## 2018-05-17 DIAGNOSIS — Z791 Long term (current) use of non-steroidal anti-inflammatories (NSAID): Secondary | ICD-10-CM | POA: Insufficient documentation

## 2018-05-17 HISTORY — DX: Bunion of left foot: M21.612

## 2018-05-17 HISTORY — PX: HALLUX VALGUS AUSTIN: SHX6623

## 2018-05-17 HISTORY — PX: PLANTAR FASCIA RELEASE: SHX2239

## 2018-05-17 HISTORY — DX: Sciatica, left side: M54.32

## 2018-05-17 HISTORY — DX: Other nonspecific abnormal finding of lung field: R91.8

## 2018-05-17 HISTORY — DX: Depression, unspecified: F32.A

## 2018-05-17 HISTORY — DX: Bunion of right foot: M21.611

## 2018-05-17 HISTORY — DX: Plantar fascial fibromatosis: M72.2

## 2018-05-17 HISTORY — DX: Hyperlipidemia, unspecified: E78.5

## 2018-05-17 HISTORY — DX: Post-traumatic stress disorder, unspecified: F43.10

## 2018-05-17 HISTORY — DX: Major depressive disorder, single episode, unspecified: F32.9

## 2018-05-17 SURGERY — CORRECTION, HALLUX VALGUS
Anesthesia: Monitor Anesthesia Care | Site: Foot | Laterality: Right

## 2018-05-17 MED ORDER — MIDAZOLAM HCL 2 MG/2ML IJ SOLN
INTRAMUSCULAR | Status: AC
  Filled 2018-05-17: qty 2

## 2018-05-17 MED ORDER — PROPOFOL 500 MG/50ML IV EMUL
INTRAVENOUS | Status: AC
  Filled 2018-05-17: qty 50

## 2018-05-17 MED ORDER — FENTANYL CITRATE (PF) 100 MCG/2ML IJ SOLN
INTRAMUSCULAR | Status: AC
  Filled 2018-05-17: qty 2

## 2018-05-17 MED ORDER — IBUPROFEN 800 MG PO TABS: 800.0000 mg | ORAL_TABLET | Freq: Three times a day (TID) | ORAL | 1 refills | Status: DC | PRN

## 2018-05-17 MED ORDER — GLYCOPYRROLATE 0.2 MG/ML IJ SOLN
INTRAMUSCULAR | Status: DC | PRN
  Administered 2018-05-17 (×2): .2 mg via INTRAVENOUS

## 2018-05-17 MED ORDER — MEPERIDINE HCL 25 MG/ML IJ SOLN
6.2500 mg | INTRAMUSCULAR | Status: DC | PRN
  Filled 2018-05-17: qty 1

## 2018-05-17 MED ORDER — FENTANYL CITRATE (PF) 100 MCG/2ML IJ SOLN
25.0000 ug | INTRAMUSCULAR | Status: DC | PRN
  Filled 2018-05-17: qty 1

## 2018-05-17 MED ORDER — CEFAZOLIN SODIUM-DEXTROSE 2-4 GM/100ML-% IV SOLN
2.0000 g | INTRAVENOUS | Status: AC
  Administered 2018-05-17: 2 g via INTRAVENOUS
  Filled 2018-05-17: qty 100

## 2018-05-17 MED ORDER — PROPOFOL 10 MG/ML IV BOLUS
INTRAVENOUS | Status: DC | PRN
  Administered 2018-05-17: 50 mg via INTRAVENOUS

## 2018-05-17 MED ORDER — LIDOCAINE HCL (PF) 2 % IJ SOLN
INTRAMUSCULAR | Status: DC | PRN
  Administered 2018-05-17: 10 mL

## 2018-05-17 MED ORDER — GLYCOPYRROLATE 0.2 MG/ML IJ SOLN
INTRAMUSCULAR | Status: AC
  Filled 2018-05-17: qty 1

## 2018-05-17 MED ORDER — PROPOFOL 500 MG/50ML IV EMUL
INTRAVENOUS | Status: DC | PRN
  Administered 2018-05-17: 200 ug/kg/min via INTRAVENOUS

## 2018-05-17 MED ORDER — ONDANSETRON HCL 4 MG/2ML IJ SOLN
INTRAMUSCULAR | Status: DC | PRN
  Administered 2018-05-17: 4 mg via INTRAVENOUS

## 2018-05-17 MED ORDER — DEXAMETHASONE SODIUM PHOSPHATE 10 MG/ML IJ SOLN
INTRAMUSCULAR | Status: DC | PRN
  Administered 2018-05-17: 5 mg via INTRAVENOUS

## 2018-05-17 MED ORDER — BUPIVACAINE HCL (PF) 0.5 % IJ SOLN
INTRAMUSCULAR | Status: DC | PRN
  Administered 2018-05-17: 30 mL

## 2018-05-17 MED ORDER — MIDAZOLAM HCL 2 MG/2ML IJ SOLN
INTRAMUSCULAR | Status: DC | PRN
  Administered 2018-05-17: 2 mg via INTRAVENOUS

## 2018-05-17 MED ORDER — LACTATED RINGERS IV SOLN
INTRAVENOUS | Status: DC
  Administered 2018-05-17: 15:00:00 via INTRAVENOUS
  Administered 2018-05-17 (×2): 1000 mL via INTRAVENOUS
  Filled 2018-05-17: qty 1000

## 2018-05-17 MED ORDER — DEXAMETHASONE SODIUM PHOSPHATE 10 MG/ML IJ SOLN
INTRAMUSCULAR | Status: AC
  Filled 2018-05-17: qty 1

## 2018-05-17 MED ORDER — ONDANSETRON HCL 4 MG/2ML IJ SOLN
4.0000 mg | Freq: Once | INTRAMUSCULAR | Status: DC | PRN
  Filled 2018-05-17: qty 2

## 2018-05-17 MED ORDER — CEFAZOLIN SODIUM-DEXTROSE 2-4 GM/100ML-% IV SOLN
INTRAVENOUS | Status: AC
  Filled 2018-05-17: qty 100

## 2018-05-17 MED ORDER — OXYCODONE-ACETAMINOPHEN 5-325 MG PO TABS: 1.0000 | ORAL_TABLET | Freq: Four times a day (QID) | ORAL | 0 refills | Status: DC | PRN

## 2018-05-17 MED ORDER — PROPOFOL 10 MG/ML IV BOLUS
INTRAVENOUS | Status: AC
  Filled 2018-05-17: qty 40

## 2018-05-17 MED ORDER — ONDANSETRON HCL 4 MG/2ML IJ SOLN
INTRAMUSCULAR | Status: AC
  Filled 2018-05-17: qty 2

## 2018-05-17 MED ORDER — FENTANYL CITRATE (PF) 100 MCG/2ML IJ SOLN
INTRAMUSCULAR | Status: DC | PRN
  Administered 2018-05-17: 25 ug via INTRAVENOUS
  Administered 2018-05-17: 50 ug via INTRAVENOUS

## 2018-05-17 MED ORDER — CHLORHEXIDINE GLUCONATE 4 % EX LIQD
60.0000 mL | Freq: Once | CUTANEOUS | Status: AC
  Administered 2018-05-17: 4 via TOPICAL
  Filled 2018-05-17: qty 118

## 2018-05-17 MED ORDER — LIDOCAINE 2% (20 MG/ML) 5 ML SYRINGE
INTRAMUSCULAR | Status: AC
  Filled 2018-05-17: qty 10

## 2018-05-17 MED ORDER — HYDROCODONE-ACETAMINOPHEN 7.5-325 MG PO TABS
1.0000 | ORAL_TABLET | Freq: Once | ORAL | Status: DC | PRN
  Filled 2018-05-17: qty 1

## 2018-05-17 SURGICAL SUPPLY — 89 items
2.0mm Cannulated Drill Bit, quick coupling, 150mm ×2 IMPLANT
APPLICATOR COTTON TIP 6 STRL (MISCELLANEOUS) ×1 IMPLANT
APPLICATOR COTTON TIP 6IN STRL (MISCELLANEOUS) ×3
BANDAGE ACE 3X5.8 VEL STRL LF (GAUZE/BANDAGES/DRESSINGS) ×1 IMPLANT
BANDAGE ACE 4X5 VEL STRL LF (GAUZE/BANDAGES/DRESSINGS) ×4 IMPLANT
BIT DRILL 2.0 HCS 150 (BIT) ×2 IMPLANT
BLADE HOOK ENDO STRL (BLADE) IMPLANT
BLADE OSCILLATING/SAGITTAL (BLADE) ×2
BLADE SURG 11 STRL SS (BLADE) ×3 IMPLANT
BLADE SURG 15 STRL LF DISP TIS (BLADE) ×2 IMPLANT
BLADE SURG 15 STRL SS (BLADE) ×2
BLADE SW THK.38XMED LNG THN (BLADE) IMPLANT
BLADE TRIANGLE EPF/EGR ENDO (BLADE) IMPLANT
BNDG ESMARK 4X9 LF (GAUZE/BANDAGES/DRESSINGS) ×3 IMPLANT
BNDG GAUZE ELAST 4 BULKY (GAUZE/BANDAGES/DRESSINGS) ×3 IMPLANT
CHLORAPREP W/TINT 26ML (MISCELLANEOUS) ×3 IMPLANT
COVER BACK TABLE 60X90IN (DRAPES) ×3 IMPLANT
COVER MAYO STAND STRL (DRAPES) ×3 IMPLANT
COVER WAND RF STERILE (DRAPES) ×3 IMPLANT
CUFF TOURN SGL QUICK 18 (TOURNIQUET CUFF) ×2 IMPLANT
CUFF TOURNIQUET SINGLE 18IN (TOURNIQUET CUFF) IMPLANT
CUFF TOURNIQUET SINGLE 24IN (TOURNIQUET CUFF) IMPLANT
CUFF TOURNIQUET SINGLE 34IN LL (TOURNIQUET CUFF) ×3 IMPLANT
DRAPE ARTHROSCOPY W/POUCH 114 (DRAPES) IMPLANT
DRAPE EXTREMITY T 121X128X90 (DISPOSABLE) ×3 IMPLANT
DRAPE IMP U-DRAPE 54X76 (DRAPES) ×3 IMPLANT
DRAPE OEC MINIVIEW 54X84 (DRAPES) ×3 IMPLANT
DRAPE SHEET LG 3/4 BI-LAMINATE (DRAPES) ×2 IMPLANT
DRAPE SURG 17X23 STRL (DRAPES) ×3 IMPLANT
DRAPE U-SHAPE 47X51 STRL (DRAPES) ×3 IMPLANT
DRSG PAD ABDOMINAL 8X10 ST (GAUZE/BANDAGES/DRESSINGS) ×2 IMPLANT
DURAPREP 26ML APPLICATOR (WOUND CARE) ×3 IMPLANT
ELECT REM PT RETURN 9FT ADLT (ELECTROSURGICAL) ×3
ELECTRODE REM PT RTRN 9FT ADLT (ELECTROSURGICAL) ×1 IMPLANT
GAUZE 4X4 16PLY RFD (DISPOSABLE) ×3 IMPLANT
GAUZE SPONGE 4X4 12PLY STRL (GAUZE/BANDAGES/DRESSINGS) ×3 IMPLANT
GAUZE XEROFORM 1X8 LF (GAUZE/BANDAGES/DRESSINGS) ×3 IMPLANT
GLOVE BIO SURGEON STRL SZ7.5 (GLOVE) IMPLANT
GLOVE BIO SURGEON STRL SZ8 (GLOVE) ×2 IMPLANT
GLOVE BIOGEL PI IND STRL 6.5 (GLOVE) IMPLANT
GLOVE BIOGEL PI IND STRL 8 (GLOVE) IMPLANT
GLOVE BIOGEL PI INDICATOR 6.5 (GLOVE) ×2
GLOVE BIOGEL PI INDICATOR 8 (GLOVE) ×2
GLOVE ECLIPSE 6.5 STRL STRAW (GLOVE) ×2 IMPLANT
GLOVE INDICATOR 8.0 STRL GRN (GLOVE) IMPLANT
GOWN STRL REUS W/ TWL LRG LVL3 (GOWN DISPOSABLE) ×1 IMPLANT
GOWN STRL REUS W/ TWL XL LVL3 (GOWN DISPOSABLE) ×1 IMPLANT
GOWN STRL REUS W/TWL LRG LVL3 (GOWN DISPOSABLE) ×2
GOWN STRL REUS W/TWL XL LVL3 (GOWN DISPOSABLE) ×4 IMPLANT
K-WIRE DBL END TROCAR 6X.045 (WIRE) ×3
K-Wire 0.045x4" ×4 IMPLANT
KWIRE DBL END TROCAR 6X.045 (WIRE) IMPLANT
MANIFOLD NEPTUNE II (INSTRUMENTS) ×3 IMPLANT
NDL HYPO 25X1 1.5 SAFETY (NEEDLE) ×1 IMPLANT
NDL SAFETY ECLIPSE 18X1.5 (NEEDLE) IMPLANT
NDL SPNL 18GX3.5 QUINCKE PK (NEEDLE) ×1 IMPLANT
NEEDLE HYPO 18GX1.5 SHARP (NEEDLE)
NEEDLE HYPO 25X1 1.5 SAFETY (NEEDLE) ×6 IMPLANT
NEEDLE SPNL 18GX3.5 QUINCKE PK (NEEDLE) ×3 IMPLANT
NS IRRIG 1000ML POUR BTL (IV SOLUTION) IMPLANT
NS IRRIG 500ML POUR BTL (IV SOLUTION) ×2 IMPLANT
PACK BASIN DAY SURGERY FS (CUSTOM PROCEDURE TRAY) ×3 IMPLANT
PACK ORTHO EXTREMITY (CUSTOM PROCEDURE TRAY) ×3 IMPLANT
PADDING CAST ABS 4INX4YD NS (CAST SUPPLIES)
PADDING CAST ABS COTTON 4X4 ST (CAST SUPPLIES) ×2 IMPLANT
PENCIL BUTTON HOLSTER BLD 10FT (ELECTRODE) ×3 IMPLANT
SCREW CANN 3X16 SHT THR (Screw) ×2 IMPLANT
SCREW CANN 3X17 SHT THR (Screw) ×4 IMPLANT
SET ARTHROSCOPY TUBING (MISCELLANEOUS)
SET ARTHROSCOPY TUBING LN (MISCELLANEOUS) IMPLANT
SPLINT FIBERGLASS 4X30 (CAST SUPPLIES) ×1 IMPLANT
STAPLER VISISTAT (STAPLE) ×2 IMPLANT
STAPLER VISISTAT 35W (STAPLE) IMPLANT
STOCKINETTE 6  STRL (DRAPES) ×2
STOCKINETTE 6 STRL (DRAPES) ×1 IMPLANT
SUCTION FRAZIER HANDLE 10FR (MISCELLANEOUS) ×2
SUCTION TUBE FRAZIER 10FR DISP (MISCELLANEOUS) IMPLANT
SUT MNCRL AB 3-0 PS2 18 (SUTURE) ×3 IMPLANT
SUT MNCRL AB 4-0 PS2 18 (SUTURE) ×3 IMPLANT
SUT MON AB 5-0 PS2 18 (SUTURE) IMPLANT
SUT PROLENE 4 0 PS 2 18 (SUTURE) ×3 IMPLANT
SUT VIC AB 4-0 P2 18 (SUTURE) IMPLANT
SUT VIC AB 4-0 PS2 27 (SUTURE) ×2 IMPLANT
SYR 10ML LL (SYRINGE) IMPLANT
SYR BULB 3OZ (MISCELLANEOUS) ×3 IMPLANT
SYR CONTROL 10ML LL (SYRINGE) ×4 IMPLANT
TUBE CONNECTING 12'X1/4 (SUCTIONS) ×1
TUBE CONNECTING 12X1/4 (SUCTIONS) ×2 IMPLANT
UNDERPAD 30X30 (UNDERPADS AND DIAPERS) ×3 IMPLANT

## 2018-05-17 NOTE — Brief Op Note (Signed)
05/17/2018  4:42 PM  PATIENT:  Lori Morse  53 y.o. female  PRE-OPERATIVE DIAGNOSIS:  RIGHT FOOT PLANTAR FASCITIS, RIGHT HALLUX ABDUCTOR VALGUS  POST-OPERATIVE DIAGNOSIS:  RIGHT FOOT PLANTAR FASCITIS, RIGHT HALLUX ABDUCTOR VALGUS  PROCEDURE:  Procedure(s): HALLUX VALGUS [place] RIGHT FOOT (Right) PLANTAR FASCIOTOMY RIGHT FOOT (Right)  SURGEON:  Surgeon(s) and Role:    [name], DPM - Primary  PHYSICIAN ASSISTANT:   ASSISTANTS: none   ANESTHESIA:   local  EBL:  5 mL   BLOOD ADMINISTERED:none  DRAINS: none   LOCAL MEDICATIONS USED:  MARCAINE    and LIDOCAINE   SPECIMEN:  No Specimen  DISPOSITION OF SPECIMEN:  N/A  COUNTS:  YES  TOURNIQUET:   Total Tourniquet Time Documented: area (Right) - 54 minutes Total: area (Right) - 54 minutes   DICTATION: .Viviann Spare Dictation  PLAN OF CARE: Discharge to home after PACU  PATIENT DISPOSITION:  PACU - hemodynamically stable.   Delay start of Pharmacological VTE agent (>24hrs) due to surgical blood loss or risk of bleeding: not applicable

## 2018-05-17 NOTE — Anesthesia Preprocedure Evaluation (Addendum)
Anesthesia Evaluation  Patient identified by MRN, date of birth, ID band Patient awake    Reviewed: Allergy & Precautions, NPO status , Patient's Chart, lab work & pertinent test results  Airway Mallampati: II  TM Distance: >3 FB Neck ROM: Full    Dental  (+) Dental Advisory Given, Teeth Intact, Caps,    Pulmonary neg pulmonary ROS,    Pulmonary exam normal breath sounds clear to auscultation       Cardiovascular negative cardio ROS Normal cardiovascular exam Rhythm:Regular Rate:Normal  ECG: SB, rate 55   Neuro/Psych PSYCHIATRIC DISORDERS Anxiety Depression PTSD (post-traumatic stress disorder)  Neuromuscular disease    GI/Hepatic negative GI ROS, Neg liver ROS,   Endo/Other  negative endocrine ROS  Renal/GU negative Renal ROS     Musculoskeletal negative musculoskeletal ROS (+)   Abdominal (+) + obese,   Peds  Hematology HLD   Anesthesia Other Findings RIGHT FOOT PLANTAR  RIGHT HALLUX ABDUCTOR VALGUS   Interpreter present- Pt voices understanding of MAC/GA anesthesia.  Reproductive/Obstetrics                         Anesthesia Physical Anesthesia Plan  ASA: II  Anesthesia Plan: MAC   Post-op Pain Management:    Induction: Intravenous  PONV Risk Score and Plan: 3 and Midazolam, Propofol infusion, Ondansetron and Treatment may vary due to age or medical condition  Airway Management Planned: Natural Airway, Simple Face Mask and Nasal Cannula  Additional Equipment:   Intra-op Plan:   Post-operative Plan:   Informed Consent: I have reviewed the patients History and Physical, chart, labs and discussed the procedure including the risks, benefits and alternatives for the proposed anesthesia with the patient or authorized representative who has indicated his/her understanding and acceptance.     Dental advisory given  Plan Discussed with: CRNA and Surgeon  Anesthesia Plan  Comments:         Anesthesia Quick Evaluation

## 2018-05-17 NOTE — Interval H&P Note (Signed)
History and Physical Interval Note:  05/17/2018 4:37 PM  Lori Morse  has presented today for surgery, with the diagnosis of RIGHT FOOT PLANTAR FASCITIS, RIGHT HALLUX ABDUCTOR VALGUS  The various methods of treatment have been discussed with the patient and family. After consideration of risks, benefits and other options for treatment, the patient has consented to  Procedure(s): [place] (Right) PLANTAR FASCIOTOMY RIGHT FOOT (Right) as a surgical intervention .  The patient's history has been reviewed, patient examined, no change in status, stable for surgery.  I have reviewed the patient's chart and labs.  Questions were answered to the patient's satisfaction.     [name]

## 2018-05-17 NOTE — H&P (Signed)
Anesthesia H&P Update: History and Physical Exam reviewed; patient is OK for planned anesthetic and procedure. ? ?

## 2018-05-17 NOTE — Transfer of Care (Signed)
Immediate Anesthesia Transfer of Care Note  Patient: Charleen Madera  Procedure(s) Performed: Procedure(s) (LRB): HALLUX VALGUS [place] RIGHT FOOT (Right) PLANTAR FASCIOTOMY RIGHT FOOT (Right)  Patient Location: PACU  Anesthesia Type: MAC  Level of Consciousness: awake, alert , oriented and patient cooperative  Airway & Oxygen Therapy: Patient Spontanous Breathing and Patient connected to face mask oxygen  Post-op Assessment: Report given to PACU RN and Post -op Vital signs reviewed and stable  Post vital signs: Reviewed and stable  Complications: No apparent anesthesia complications Last Vitals:  Vitals Value Taken Time  BP 123/71 05/17/2018  4:30 PM  Temp    Pulse 48 05/17/2018  4:31 PM  Resp 11 05/17/2018  4:31 PM  SpO2 98 % 05/17/2018  4:31 PM  Vitals shown include unvalidated device data.  Last Pain:  Vitals:   05/17/18 1246  TempSrc:   PainSc: 0-No pain      Patients Stated Pain Goal: 7 (05/17/18 1246)

## 2018-05-17 NOTE — Anesthesia Postprocedure Evaluation (Signed)
Anesthesia Post Note  Patient: Lori Morse  Procedure(s) Performed: HALLUX VALGUS [place] RIGHT FOOT (Right Foot) PLANTAR FASCIOTOMY RIGHT FOOT (Right Foot)     Patient location during evaluation: PACU Anesthesia Type: MAC Level of consciousness: awake and alert Pain management: pain level controlled Vital Signs Assessment: post-procedure vital signs reviewed and stable Respiratory status: spontaneous breathing, nonlabored ventilation, respiratory function stable and patient connected to nasal cannula oxygen Cardiovascular status: stable and blood pressure returned to baseline Postop Assessment: no apparent nausea or vomiting Anesthetic complications: no    Last Vitals:  Vitals:   05/17/18 1715 05/17/18 1800  BP: (!) 160/86 135/79  Pulse: 66 64  Resp: 15 16  Temp:  36.7 C  SpO2: 96% 97%    Last Pain:  Vitals:   05/17/18 1800  TempSrc: Oral  PainSc: 0-No pain                 Ryan P Ellender

## 2018-05-17 NOTE — Anesthesia Procedure Notes (Signed)
Procedure Name: MAC Date/Time: 05/17/2018 3:13 PM Performed by: Wanita Chamberlain, CRNA Pre-anesthesia Checklist: Patient identified, Emergency Drugs available, Suction available and Patient being monitored Patient Re-evaluated:Patient Re-evaluated prior to induction Oxygen Delivery Method: Nasal cannula Induction Type: IV induction Placement Confirmation: CO2 detector,  breath sounds checked- equal and bilateral and positive ETCO2 Dental Injury: Teeth and Oropharynx as per pre-operative assessment

## 2018-05-18 MED FILL — IBUPROFEN 800 MG TABLET: 800 | 30 days supply | Qty: 90 | Fill #0

## 2018-05-21 ENCOUNTER — Encounter (HOSPITAL_BASED_OUTPATIENT_CLINIC_OR_DEPARTMENT_OTHER): Payer: Self-pay | Admitting: Podiatry

## 2018-05-21 NOTE — Op Note (Signed)
OPERATIVE REPORT Patient name: Lori Morse MRN: [id number] DOB: 02/02/1966  DOS:  05/17/2018  Preop Dx: Hallux valgus right. Chronic plantar fasciitis right.  Postop Dx: same  Procedure:  1.  Bunionectomy with double osteotomy right foot 2.  Open plantar fasciotomy right foot  Surgeon: [name] DPM  Anesthesia: 50-50 mixture of 2% lidocaine plain with 0.5% Marcaine plain totaling 20 cc infiltrated in the patient's right lower extremity  Hemostasis: Ankle tourniquet inflated to a pressure of 264mmHg after esmarch exsanguination   EBL: Minimal mL Materials: Synthes 3.0 millimeter screw x3 Injectables: None Pathology: None  Condition: The patient tolerated the procedure and anesthesia well. No complications noted or reported   Justification for procedure: The patient is a 53 y.o. female who presents today for surgical correction of symptomatic hallux valgus and chronic plantar fasciitis right lower extremity. All conservative modalities of been unsuccessful in providing any sort of satisfactory alleviation of symptoms with the patient. The patient was told benefits as well as possible side effects of the surgery. The patient consented for surgical correction. The patient consent form was reviewed. All patient questions were answered. No guarantees were expressed or implied. The patient and the surgeon boson the patient consent form with the witness present and placed in the patient's chart.   Procedure in Detail: The patient was brought to the operating room, placed in the operating table in the supine position at which time an aseptic scrub and drape were performed about the patient's respective lower extremity after anesthesia was induced as described above. Attention was then directed to the surgical area where procedure number one commenced.  Procedure #1: Bunionectomy with first metatarsal osteotomy and Akin osteotomy right foot A 5 cm linear longitudinal skin  incision was planned and made overlying the first metatarsal phalangeal joint of the right foot.  Incision was carried down to the level of joint capsule with care taken to cut clamp ligate and retract well small neurovascular structures traversing the incision site.  Inverted L shaped capsulotomy was performed to expose the underlying metatarsophalangeal joint.  Sharp soft tissue dissection was utilized to reflect away the capsular and periosteal tissue from the hypertrophic eminence of the first metatarsal.  The medial eminence of the first metatarsal was resected away using a sagittal blade mount on sagittal saw.  A chevron type osteotomy was performed about the distal aspect of the first metatarsal.  The plantar osteotomy exited the plantar cortex proximal to the sesamoidal apparatus.  The dorsal osteotomy exited the dorsal cortex much more proximal than the plantar osteotomy in essence creating a long dorsal arm to facilitate better internal fixation and reduction of the IM angle.  3.0 mm Synthes screws x2 was utilized for permanent internal fixation.  Screws were inserted in standard AO fashion.  The cortical step-off which had been created after translocation of the capital fragment and internal fixation was resected away using sagittal blade mount a sagittal saw.  After evaluation intraoperatively the hallux was still deviated laterally so to correct for a hallux abductus angle the decision was made to perform an Akin osteotomy.  Sharp soft tissue dissection was utilized to reflect away the periosteum and capsular tissue from the proximal phalanx.  Oblique wedge type osteotomy was performed in a proximal lateral to medial distal orientation.  The wedge of bone was removed and reduced and the toe was aligned in a more rectus position.  Permanent internal fixation was utilized using a 3.0 mm Synthes screw x1.  Screw was inserted in standard AO fashion.  Intraoperative x-ray fluoroscopy was utilized to  visualize the placement of all orthopedic screws and reduction of the IM angle which was satisfactory.  First ray in a rectus alignment.  Copious irrigation was utilized and the incision site was dried in preparation for routine layered soft tissue closure.  3-0 Vicryl suture was utilized to reapproximate capsular tissue followed by 4-0 Vicryl suture to reapproximate subcuticular tissue followed by stainless steel skin staples to reapproximate superficial skin edges.  Procedure #2: Open plantar fasciotomy right foot A 2 cm incision was made dorsal to plantar at the level of the insertion of the plantar fascia right foot medial.  Blunt dissection was utilized to bluntly dissect down to the level of the insertion of the plantar fascia.  Curved Metzenbaum scissors were utilized to resect away the medial one half of the plantar fascia right foot.  Released tension was noted intraoperatively of the plantar fascia.  After release the plantar fascia the incision site was irrigated with normal saline and dried in preparation for routine layered soft tissue closure.  4-0 Vicryl suture was utilized to reapproximate subcuticular tissue followed by 4-0 Prolene suture to reapproximate superficial skin edges.  Dry sterile compressive dressings were then applied to all previously mentioned incision sites about the patient's lower extremity. The tourniquet which was used for hemostasis was deflated. All normal neurovascular responses including pink color and warmth returned all the digits of patient's lower extremity.  The patient was then transferred from the operating room to the recovery room having tolerated the procedure and anesthesia well. All vital signs are stable. After a brief stay in the recovery room the patient was discharged with adequate prescriptions for analgesia. Verbal as well as written instructions were provided for the patient regarding wound care. The patient is to keep the dressings clean dry and  intact until they are to follow surgeon Dr. Daylene Katayama in the office upon discharge.   [name], DPM [facility]  Dr. [name], [place]                                        [place], [place] [address]                Office [phone]  Fax [phone]

## 2018-05-23 ENCOUNTER — Other Ambulatory Visit: Payer: Self-pay | Admitting: Podiatry

## 2018-05-23 ENCOUNTER — Ambulatory Visit: Payer: Self-pay

## 2018-05-23 ENCOUNTER — Ambulatory Visit (INDEPENDENT_AMBULATORY_CARE_PROVIDER_SITE_OTHER): Payer: No Typology Code available for payment source | Admitting: Podiatry

## 2018-05-23 ENCOUNTER — Encounter: Payer: Self-pay | Admitting: Podiatry

## 2018-05-23 VITALS — BP 97/55 | HR 73 | Temp 97.6°F | Resp 16

## 2018-05-23 DIAGNOSIS — M21619 Bunion of unspecified foot: Secondary | ICD-10-CM

## 2018-05-23 DIAGNOSIS — M722 Plantar fascial fibromatosis: Secondary | ICD-10-CM

## 2018-05-23 DIAGNOSIS — Z09 Encounter for follow-up examination after completed treatment for conditions other than malignant neoplasm: Secondary | ICD-10-CM

## 2018-05-23 DIAGNOSIS — M2012 Hallux valgus (acquired), left foot: Secondary | ICD-10-CM

## 2018-05-23 DIAGNOSIS — M21612 Bunion of left foot: Secondary | ICD-10-CM

## 2018-05-29 ENCOUNTER — Encounter (INDEPENDENT_AMBULATORY_CARE_PROVIDER_SITE_OTHER): Payer: Self-pay | Admitting: Family Medicine

## 2018-05-29 ENCOUNTER — Other Ambulatory Visit: Payer: Self-pay

## 2018-05-29 ENCOUNTER — Ambulatory Visit (INDEPENDENT_AMBULATORY_CARE_PROVIDER_SITE_OTHER): Payer: Self-pay | Admitting: Family Medicine

## 2018-05-29 VITALS — BP 124/73 | HR 58 | Temp 97.8°F | Ht 62.0 in | Wt 185.4 lb

## 2018-05-29 DIAGNOSIS — T402X5A Adverse effect of other opioids, initial encounter: Secondary | ICD-10-CM

## 2018-05-29 DIAGNOSIS — L723 Sebaceous cyst: Secondary | ICD-10-CM

## 2018-05-29 DIAGNOSIS — F324 Major depressive disorder, single episode, in partial remission: Secondary | ICD-10-CM

## 2018-05-29 DIAGNOSIS — K5903 Drug induced constipation: Secondary | ICD-10-CM

## 2018-05-29 NOTE — Progress Notes (Signed)
Subjective:    Patient ID: Lori Morse, female    DOB: 05-26-1965, 53 y.o.   MRN: [id number]   Due to a language barrier, Stratus interpretation system was used at today's visit  HPI       53 yo female with a history of depression, PTSD, and hyperlipidemia who is seen status post surgery on 05/17/2018 by podiatry for correction of a bunion on the right foot as well as plantar fasciotomy for treatment of plantar fasciitis.  Patient reports that she is feeling well at today's visit.  Patient states that she was told by his podiatry that once she is healed from her current surgery then she will have additional surgery for issues with the left foot.  Patient has been on opioid pain medication, oxycodone acetaminophen and has had issues with constipation.  Patient denies any current abdominal pain.  Patient continues to take her duloxetine for depression and patient states that sometimes she feels a little sad but feels that the medication helps her to feel better, more joyful.  Patient states that overall she has no concerns but has noticed a small bump on her mid upper chest and patient wonders if this represents anything dangerous.  Area does not hurt and is very small in size and has not gotten any larger. Past Medical History:  Diagnosis Date  . Bilateral bunions   . Depression   . Hyperlipidemia   . Left sided sciatica 03/2017   resolved  . Plantar fasciitis, right   . PTSD (post-traumatic stress disorder)   . Pulmonary nodules 04/17/2018   Bilateral, noted on CXR   Past Surgical History:  Procedure Laterality Date  . HALLUX VALGUS AUSTIN Right 05/17/2018   Procedure: HALLUX VALGUS AUSTIN RIGHT FOOT;  Surgeon: [name], DPM;  Location: [place];  Service: Podiatry;  Laterality: Right;  . PLANTAR FASCIA RELEASE Right 05/17/2018   Procedure: PLANTAR FASCIOTOMY RIGHT FOOT;  Surgeon: [name], DPM;  Location: [place];  Service: Podiatry;   Laterality: Right;  . Steroid Injections     foot   Social History   Tobacco Use  . Smoking status: Never Smoker  . Smokeless tobacco: Never Used  Substance Use Topics  . Alcohol use: Never    Frequency: Never  . Drug use: Never  No Known Allergies   Review of Systems  Constitutional: Positive for fatigue. Negative for chills and fever.  HENT: Negative.   Respiratory: Negative for cough and shortness of breath.   Cardiovascular: Positive for leg swelling (right LE s/p surgery). Negative for chest pain and palpitations.  Gastrointestinal: Positive for constipation. Negative for abdominal pain.  Endocrine: Negative for polydipsia, polyphagia and polyuria.  Genitourinary: Negative for dysuria and frequency.  Musculoskeletal: Positive for arthralgias and gait problem.  Neurological: Negative for dizziness and headaches.  Hematological: Negative for adenopathy. Does not bruise/bleed easily.       Objective:   Physical Exam Vitals signs and nursing note reviewed.  Constitutional:      General: She is not in acute distress.    Appearance: Normal appearance. She is normal weight.     Comments: Wearing cam walker/boot on the right lower leg/foot;crutches leaned against the wall nearby and patient sitting in chair  Neck:     Musculoskeletal: Normal range of motion and neck supple.  Cardiovascular:     Rate and Rhythm: Normal rate and regular rhythm.     Pulses: Normal pulses.  Heart sounds: Normal heart sounds.  Pulmonary:     Effort: Pulmonary effort is normal.     Breath sounds: Normal breath sounds. No wheezing or rhonchi.  Abdominal:     General: Bowel sounds are normal. There is distension (mild).     Palpations: Abdomen is soft.     Tenderness: There is no abdominal tenderness. There is no right CVA tenderness, left CVA tenderness or guarding.  Musculoskeletal:     Left lower leg: No edema.     Comments: Wearing cam boot to the right lower leg status post surgery   Lymphadenopathy:     Cervical: No cervical adenopathy.  Skin:    General: Skin is warm and dry.     Findings: Lesion (small, nontender, sebaceous cyst on the upper midchest wall; no erythema) present. No rash.  Neurological:     General: No focal deficit present.     Mental Status: She is alert and oriented to person, place, and time.  Psychiatric:        Mood and Affect: Mood normal.        Behavior: Behavior normal.        Thought Content: Thought content normal.        Judgment: Judgment normal.    BP 124/73 (BP Location: Left Arm, Patient Position: Sitting, Cuff Size: Normal)   Pulse (!) 58   Temp 97.8 F (36.6 C) (Oral)   Ht 5\' 2"  (1.575 m)   Wt 185 lb 6.4 oz (84.1 kg)   LMP 04/07/2018 (Exact Date)   SpO2 96%   BMI 33.91 kg/m         Assessment & Plan:  1. Constipation due to opioid therapy Patient is status post surgery for the treatment of bunion and plantar fasciitis on the right foot.  Patient is on oxycodone-acetaminophen for pain and has developed secondary constipation.  Patient is encouraged to increase water as well as fiber by increasing fresh fruits and vegetables in the diet and use an over-the-counter laxative as needed to help ensure that she has a bowel movement at least every 3 days and to call or return to clinic if she has issues with abdominal pain, rectal bleeding/blood in the stool or any concerns.  Handout on constipation given as part of AVS.  2. Sebaceous cyst Patient with a very small sebaceous cyst on the mid upper chest.  Patient is encouraged to use warm compress if the area starts to enlarge and return for follow-up if the area starts to enlarge, becomes tender, warm, red or if she has any concerns  3. Depression, major, single episode, in partial remission [facility]) Patient reports that she is doing well on duloxetine 30 mg daily which she believes is controlling her depression.  Patient declines to have consultation with the social worker at  today's visit.  An After Visit Summary was printed and given to the patient.  Return in about 3 months (around 08/28/2018) for chronic issues.

## 2018-05-29 NOTE — Patient Instructions (Signed)
Prevencin del estreimiento tras una ciruga Preventing Constipation After Surgery El estreimiento es un problema frecuente despus de [place]. Muchos factores puede aumentar el riesgo de tener estreimiento luego de una ciruga; entre ellos:  Ciertos medicamentos, en especial, los anestsicos y ciertos analgsicos muy fuertes llamados opioides.  Sentir estrs a causa de Diplomatic Services operational officer.  Consumir alimentos distintos de los habituales.  Estar menos activo. Los sntomas de estreimiento son los siguiente:  Defecar menos de tres veces por semana.  Esforzarse mucho para defecar.  Tener heces secas, duras o de mayor tamao de lo habitual.  Sensacin de estar lleno o hinchado.  Sentir dolor en la parte inferior del abdomen.  No sentir alivio despus de defecar. Puede tomar ciertas medidas para evitar el estreimiento tras [place]. Siga estas indicaciones en su casa: Comida y bebida   Consumir alimentos con alto contenido de [place]. Entre ellos, frutas y verduras frescas, cereales integrales y frijoles.  Limitar el consumo de alimentos ricos en grasa y azcares procesados, como alimentos fritos o dulces. Estos incluyen patatas fritas, hamburguesas, galletas y dulces.  Tome un suplemento de fibras como se lo haya indicado el mdico. Si no toma un suplemento de fibras y piensa que no obtiene suficiente fibras de los alimentos, hable con el mdico sobre agregar un suplemento de fibras a su dieta.  Beba suficiente lquido para Consulting civil engineer orina de color amarillo plido.  Tome lquidos claros, especialmente, agua. Evite consumir alcohol, cafena y refrescos. Estos pueden Agricultural engineer. Actividad  Despus de la [place], retome sus actividades normales lentamente o cuando el mdico lo autorice.  Comience a caminar tan pronto como pueda. Intente caminar un poco ms [place].  Una vez que el mdico lo autorice, haga algn tipo de actividad fsica regularmente. Esto ayuda  a Engineer, civil (consulting). Deposiciones  Vaya al bao cuando sienta la necesidad de ir. No se aguante las ganas.  Pruebe a tomar algo caliente para poder defecar.  Registre la frecuencia con la que [place] el bao. Medicamentos  [employer] de venta libre y los recetados solamente como se lo haya indicado el mdico.  Hable con el mdico sobre los medicamentos que podran ayudar a Engineer, maintenance estreimiento, en especial, si tiene antecedentes de este cuadro clnico. El mdico podra sugerirle el uso de laxantes emolientes, laxantes o un suplemento de [place].  No tome medicamentos sin antes consultar con su mdico. Comunquese con un mdico si:  [place] laxantes emolientes o laxantes, y no pudo defecar en el transcurso de entre 24 y 48horas despus de tomarlos.  No defeca durante 3das.  Tiene fiebre. Solicite ayuda de inmediato si:  El estreimiento dura ms de 4 das o [place].  Observa sangre brillante en las heces.  Siente dolor en el abdomen o el recto.  Tiene clicos intestinales muy intensos.  Las heces son delgadas como un lpiz.  Baja de peso sin causa aparente. Resumen  El estreimiento es un problema frecuente despus de [place]. Muchas cosas pueden aumentar el riesgo de tener estreimiento tras [place]; por ejemplo, ciertos medicamentos, consumir alimentos diferentes de los habituales y estar menos [place].  Algunos de los sntomas del estreimiento son defecar menos de tres veces por semana, tener dificultad para defecar, sentir dolor en la parte inferior del abdomen y sentirse lleno o con meteorismo.  Para prevenir el estreimiento puede ser til seguir una dieta que tenga muchos alimentos con alto contenido de [place], Optometrist actividad fsica y tomar ciertos medicamentos,  veces por semana, tener dificultad para defecar, sentir dolor en la parte inferior del abdomen y sentirse lleno o con meteorismo.   Para prevenir el estreimiento puede ser til seguir una dieta que tenga muchos alimentos con alto contenido de fibras, realizar actividad fsica y tomar ciertos medicamentos, como los laxantes emolientes y los laxantes.  Esta informacin no tiene como fin reemplazar el consejo del mdico. Asegrese de hacerle al mdico cualquier pregunta que  tenga.  Document Released: 08/13/2012 Document Revised: 12/28/2016 Document Reviewed: 12/28/2016  Elsevier Interactive Patient Education  2019 Elsevier Inc.

## 2018-05-30 ENCOUNTER — Ambulatory Visit: Payer: No Typology Code available for payment source | Admitting: Podiatry

## 2018-05-30 DIAGNOSIS — M722 Plantar fascial fibromatosis: Secondary | ICD-10-CM

## 2018-05-30 DIAGNOSIS — M21619 Bunion of unspecified foot: Secondary | ICD-10-CM

## 2018-05-30 DIAGNOSIS — M2012 Hallux valgus (acquired), left foot: Secondary | ICD-10-CM

## 2018-05-30 DIAGNOSIS — M21612 Bunion of left foot: Secondary | ICD-10-CM

## 2018-06-03 NOTE — Progress Notes (Signed)
   Subjective:  Patient presents today status post bunionectomy with open plantar fasciotomy right. DOS: 05/17/2018. She states she is doing well. She reports some tingling at night time. She reports that any pain she may have is controlled with medication. There are no modifying factors noted. She has been taking Tylenol and Ibuprofen as well as ice therapy for treatment. She has been using the CAM boot as directed. Patient is here for further evaluation and treatment.    Past Medical History:  Diagnosis Date  . Bilateral bunions   . Depression   . Hyperlipidemia   . Left sided sciatica 03/2017   resolved  . Plantar fasciitis, right   . PTSD (post-traumatic stress disorder)   . Pulmonary nodules 04/17/2018   Bilateral, noted on CXR      Objective/Physical Exam Neurovascular status intact.  Skin incisions appear to be well coapted with sutures and staples intact. No sign of infectious process noted. No dehiscence. No active bleeding noted. Moderate edema noted to the surgical extremity.  Radiographic Exam:  Orthopedic hardware and osteotomies sites appear to be stable with routine healing.  Assessment: 1. s/p bunionectomy with open plantar fasciotomy right. DOS: 05/17/2018   Plan of Care:  1. Patient was evaluated. X-rays reviewed 2. Dressing changed. Keep clean, dry and intact for one week.  3. Continue weightbearing in CAM boot.  4. Return to clinic in one week.    [name], DPM [facility]  Dr. [name], [address]                                        [place], [place] [address]                Office [phone]  Fax [phone]

## 2018-06-06 NOTE — Progress Notes (Signed)
   Subjective:  Patient presents today status post bunionectomy with open plantar fasciotomy right. DOS: 05/17/2018. She states she is doing well and that her pain has improved. She denies any modifying factors. She has been using the CAM boot as directed. Patient is here for further evaluation and treatment.    Past Medical History:  Diagnosis Date  . Bilateral bunions   . Depression   . Hyperlipidemia   . Left sided sciatica 03/2017   resolved  . Plantar fasciitis, right   . PTSD (post-traumatic stress disorder)   . Pulmonary nodules 04/17/2018   Bilateral, noted on CXR      Objective/Physical Exam Neurovascular status intact.  Skin incisions appear to be well coapted with sutures and staples intact. No sign of infectious process noted. No dehiscence. No active bleeding noted. Moderate edema noted to the surgical extremity.  Assessment: 1. s/p bunionectomy with open plantar fasciotomy right. DOS: 05/17/2018   Plan of Care:  1. Patient was evaluated.  2. Dressing changed. Keep clean, dry and intact for one week.  3. Continue weightbearing in CAM boot.  4. Return to clinic in one week.    [name], DPM [facility]  Dr. [name], [place]                                        [place], [place] [address]                Office [phone]  Fax [phone]

## 2018-06-13 ENCOUNTER — Encounter: Payer: Self-pay | Admitting: Podiatry

## 2018-06-13 ENCOUNTER — Ambulatory Visit (INDEPENDENT_AMBULATORY_CARE_PROVIDER_SITE_OTHER): Payer: Self-pay | Admitting: Podiatry

## 2018-06-13 DIAGNOSIS — M722 Plantar fascial fibromatosis: Secondary | ICD-10-CM

## 2018-06-13 DIAGNOSIS — M21612 Bunion of left foot: Secondary | ICD-10-CM

## 2018-06-13 DIAGNOSIS — M2011 Hallux valgus (acquired), right foot: Secondary | ICD-10-CM

## 2018-06-13 DIAGNOSIS — M2012 Hallux valgus (acquired), left foot: Secondary | ICD-10-CM

## 2018-06-13 DIAGNOSIS — Z09 Encounter for follow-up examination after completed treatment for conditions other than malignant neoplasm: Secondary | ICD-10-CM

## 2018-06-13 DIAGNOSIS — M21611 Bunion of right foot: Secondary | ICD-10-CM

## 2018-06-18 NOTE — Progress Notes (Signed)
   Subjective:  Patient presents today status post bunionectomy with open plantar fasciotomy right. DOS: 05/17/2018. She states she is doing well. She reports some mild pain but denies anything significant. She denies modifying factors. She has been using the CAM boot as directed. Patient is here for further evaluation and treatment.    Past Medical History:  Diagnosis Date  . Bilateral bunions   . Depression   . Hyperlipidemia   . Left sided sciatica 03/2017   resolved  . Plantar fasciitis, right   . PTSD (post-traumatic stress disorder)   . Pulmonary nodules 04/17/2018   Bilateral, noted on CXR      Objective/Physical Exam Neurovascular status intact.  Skin incisions appear to be well coapted with sutures and staples intact. No sign of infectious process noted. No dehiscence. No active bleeding noted. Moderate edema noted to the surgical extremity.  Assessment: 1. s/p bunionectomy with open plantar fasciotomy right. DOS: 05/17/2018   Plan of Care:  1. Patient was evaluated.  2. Sutures and staples removed.  3. Discontinue using CAM boot.  4. Post op shoe dispensed.  5. Compression anklet dispensed.  6. Return to clinic in 4 weeks.     [name], DPM [facility]  Dr. [name], [place]                                        [place], [place] [address]                Office [phone]  Fax [phone]

## 2018-06-19 MED FILL — LOVASTATIN 20 MG TABLET: 20 | 30 days supply | Qty: 30 | Fill #1

## 2018-06-19 MED FILL — IBUPROFEN 800 MG TABLET: 800 | 15 days supply | Qty: 45 | Fill #0

## 2018-07-11 ENCOUNTER — Ambulatory Visit (INDEPENDENT_AMBULATORY_CARE_PROVIDER_SITE_OTHER): Payer: No Typology Code available for payment source | Admitting: Podiatry

## 2018-07-11 ENCOUNTER — Other Ambulatory Visit: Payer: Self-pay

## 2018-07-11 ENCOUNTER — Encounter: Payer: Self-pay | Admitting: Podiatry

## 2018-07-11 ENCOUNTER — Ambulatory Visit: Payer: No Typology Code available for payment source

## 2018-07-11 ENCOUNTER — Other Ambulatory Visit: Payer: Self-pay | Admitting: Podiatry

## 2018-07-11 DIAGNOSIS — M2011 Hallux valgus (acquired), right foot: Secondary | ICD-10-CM

## 2018-07-11 DIAGNOSIS — M21611 Bunion of right foot: Principal | ICD-10-CM

## 2018-07-11 DIAGNOSIS — Z09 Encounter for follow-up examination after completed treatment for conditions other than malignant neoplasm: Secondary | ICD-10-CM

## 2018-07-11 DIAGNOSIS — M722 Plantar fascial fibromatosis: Secondary | ICD-10-CM

## 2018-07-16 NOTE — Progress Notes (Signed)
   Subjective:  Patient presents today status post bunionectomy with open plantar fasciotomy right. DOS: 05/17/2018. She states she has improved significantly. She denies any significant pain but notes some mild tenderness. She denies modifying factors. She has been using the post op shoe as directed. Patient is here for further evaluation and treatment.    Past Medical History:  Diagnosis Date  . Bilateral bunions   . Depression   . Hyperlipidemia   . Left sided sciatica 03/2017   resolved  . Plantar fasciitis, right   . PTSD (post-traumatic stress disorder)   . Pulmonary nodules 04/17/2018   Bilateral, noted on CXR      Objective/Physical Exam Neurovascular status intact.  Skin incisions appear to be well coapted. No sign of infectious process noted. No dehiscence. No active bleeding noted. Moderate edema noted to the surgical extremity.  Radiographic Exam:  Normal osseous mineralization. Joint spaces preserved. No fracture/dislocation/boney destruction.    Assessment: 1. s/p bunionectomy with open plantar fasciotomy right. DOS: 05/17/2018   Plan of Care:  1. Patient was evaluated. X-Rays reviewed.  2. Patient can transition out of post op shoe into tennis shoes over the next two weeks.  3. Return to work on August 13, 2018.  4. Return to clinic in 6 weeks for final follow up with new X-Ray.      [name], DPM [facility]  Dr. [name], [place]                                        [place], [place] [address]                Office [phone]  Fax [phone]

## 2018-08-22 ENCOUNTER — Ambulatory Visit: Payer: No Typology Code available for payment source

## 2018-08-22 ENCOUNTER — Ambulatory Visit (INDEPENDENT_AMBULATORY_CARE_PROVIDER_SITE_OTHER): Payer: No Typology Code available for payment source | Admitting: Podiatry

## 2018-08-22 ENCOUNTER — Other Ambulatory Visit: Payer: Self-pay

## 2018-08-22 VITALS — Temp 97.7°F

## 2018-08-22 DIAGNOSIS — M21611 Bunion of right foot: Secondary | ICD-10-CM

## 2018-08-22 DIAGNOSIS — M2011 Hallux valgus (acquired), right foot: Secondary | ICD-10-CM

## 2018-08-22 DIAGNOSIS — M722 Plantar fascial fibromatosis: Secondary | ICD-10-CM

## 2018-08-22 NOTE — Progress Notes (Signed)
   Subjective:  Patient presents today status post bunionectomy with open plantar fasciotomy right. DOS: 05/17/2018.  Patient only has minimal pain when walking for long distances.  Pain only gets up to 1-2/10.  She is otherwise doing very well with no complaints.  She does have consistent bunion pain to the left foot however it is somewhat controlled and we have discussed surgery in the past but currently due to the COVID-19 pandemic this surgery will be postponed.    Past Medical History:  Diagnosis Date  . Bilateral bunions   . Depression   . Hyperlipidemia   . Left sided sciatica 03/2017   resolved  . Plantar fasciitis, right   . PTSD (post-traumatic stress disorder)   . Pulmonary nodules 04/17/2018   Bilateral, noted on CXR      Objective/Physical Exam Neurovascular status intact.  Skin incisions appear to be well coapted.  No edema.  Range of motion within normal limits.  Negative for any pain on palpation.  Radiographic Exam:  Normal osseous mineralization. Joint spaces preserved. No fracture/dislocation/boney destruction.  Orthopedic hardware intact.  Assessment: 1. s/p bunionectomy with open plantar fasciotomy right. DOS: 05/17/2018   Plan of Care:  1. Patient was evaluated. X-Rays reviewed.  2.  Recommend good supportive tennis shoes 3.  Patient may return to work full activity no restrictions 4.  Return to clinic in 6 months to possibly address left foot bunion surgical consult   [name], DPM [facility]  Dr. [name], [place]                                        [place], [place] [address]                Office [phone]  Fax [phone]

## 2018-08-28 ENCOUNTER — Ambulatory Visit (INDEPENDENT_AMBULATORY_CARE_PROVIDER_SITE_OTHER): Payer: Self-pay | Admitting: Primary Care

## 2018-08-28 ENCOUNTER — Encounter: Payer: Self-pay | Admitting: Primary Care

## 2018-08-28 ENCOUNTER — Ambulatory Visit: Payer: Self-pay | Attending: Primary Care | Admitting: Primary Care

## 2018-08-28 ENCOUNTER — Other Ambulatory Visit: Payer: Self-pay

## 2018-08-28 DIAGNOSIS — M21612 Bunion of left foot: Secondary | ICD-10-CM

## 2018-08-28 DIAGNOSIS — Z760 Encounter for issue of repeat prescription: Secondary | ICD-10-CM

## 2018-08-28 DIAGNOSIS — K0889 Other specified disorders of teeth and supporting structures: Secondary | ICD-10-CM

## 2018-08-28 DIAGNOSIS — M21611 Bunion of right foot: Secondary | ICD-10-CM | POA: Insufficient documentation

## 2018-08-28 DIAGNOSIS — E782 Mixed hyperlipidemia: Secondary | ICD-10-CM

## 2018-08-28 MED ORDER — LOVASTATIN 20 MG PO TABS: 20.0000 mg | ORAL_TABLET | Freq: Every day | ORAL | 3 refills | Status: DC

## 2018-08-28 MED ORDER — IBUPROFEN 800 MG PO TABS: 800.0000 mg | ORAL_TABLET | Freq: Three times a day (TID) | ORAL | 1 refills | Status: DC | PRN

## 2018-08-28 MED FILL — ?IBUPROFEN 800 MG TABS: AMNEAL | 30 days supply | Qty: 90 | Fill #0

## 2018-08-28 MED FILL — LOVASTATIN 20 MG TABS: 20 | 30 days supply | Qty: 30 | Fill #0

## 2018-08-28 NOTE — Progress Notes (Signed)
Virtual Visit via Telephone Note  I connected with Lori Morse on 08/28/18 at  8:50 AM EDT by telephone and verified that I am speaking with the correct person using two identifiers.   I discussed the limitations, risks, security and privacy concerns of performing an evaluation and management service by telephone and the availability of in person appointments. I also discussed with the patient that there may be a patient responsible charge related to this service. The patient expressed understanding and agreed to proceed.   History of Present Illness: Patient is needing a dental referral for a dental appt. She complains of molar on the left side had previously been tx by dentist 2018 . The denist office was to call her back .This tooth appears to have flesh (gum) coming through. Tooth hurts when brushing and eating. She is also requesting medication refills.  Review of Systems  Constitutional: Negative.   HENT:       Broken molar pain and bleeds  Eyes: Negative.   Respiratory: Negative.   Cardiovascular: Negative.   Gastrointestinal: Negative.   Genitourinary: Negative.   Musculoskeletal: Negative.   Skin: Negative.   Neurological: Negative.   Endo/Heme/Allergies: Negative.   Psychiatric/Behavioral: Positive for depression.   Assessment and Plan: Lori Morse was seen today for follow-up.  Diagnoses and all orders for this visit:  Pain, dental Refer to the dentist . This maybe take longer for a visit due to COVID-19.i She may use  -     ibuprofen (ADVIL) 800 MG tablet; Take 1 tablet (800 mg total) by mouth every 8 (eight) hours as needed.  Mixed hyperlipidemia  This is a chronic problem. Condition status: needs blood work. Recent lipid tests were reviewed and are variable. Exacerbating diseases include obesity. Current antihyperlipidemic treatment includes statins. The current treatment provides moderate improvement of lipids. There are no compliance problems.  Risk factors for  coronary artery disease include a sedentary lifestyle, dyslipidemia and obesity.   -     lovastatin (MEVACOR) 20 MG tablet; Take 1 tablet (20 mg total) by mouth at bedtime. -     Comprehensive metabolic panel; Future -     Lipid panel Future  Bilateral bunions Dr. Daylene Katayama DPM performed a 05/17/2018 bunionectomy with open plantar fasciotomy right. 06/13/2018 f/u was able to d/c Cam boot      Follow Up Instructions:  Labs are placed for CMP and FLP . Patient advise to come any morning Monday- Friday fasting .    I discussed the assessment and treatment plan with the patient. The patient was provided an opportunity to ask questions and all were answered. The patient agreed with the plan and demonstrated an understanding of the instructions.   The patient was advised to call back or seek an in-person evaluation if the symptoms worsen or if the condition fails to improve as anticipated.  I provided 25 minutes of non-face-to-face time during this encounter.   Kerin Perna, NP

## 2018-08-28 NOTE — Progress Notes (Signed)
Patient verified DOB Patient has taken medication. Patient has not eaten today. Patient complains of  Patient needs a refill on cholesterol medication. Patient complains of tooth pain. She needs to have tooth removed and needs a dental referral placed.

## 2018-12-04 ENCOUNTER — Other Ambulatory Visit: Payer: Self-pay

## 2018-12-04 ENCOUNTER — Encounter (INDEPENDENT_AMBULATORY_CARE_PROVIDER_SITE_OTHER): Payer: Self-pay | Admitting: Primary Care

## 2018-12-04 ENCOUNTER — Ambulatory Visit (INDEPENDENT_AMBULATORY_CARE_PROVIDER_SITE_OTHER): Payer: Self-pay | Admitting: Primary Care

## 2018-12-04 VITALS — BP 112/71 | HR 62 | Temp 98.6°F | Ht 62.0 in | Wt 184.0 lb

## 2018-12-04 DIAGNOSIS — K5900 Constipation, unspecified: Secondary | ICD-10-CM

## 2018-12-04 DIAGNOSIS — E785 Hyperlipidemia, unspecified: Secondary | ICD-10-CM

## 2018-12-04 DIAGNOSIS — M79671 Pain in right foot: Secondary | ICD-10-CM

## 2018-12-04 MED ORDER — IBUPROFEN 800 MG PO TABS: 800.0000 mg | ORAL_TABLET | Freq: Three times a day (TID) | ORAL | 1 refills | Status: DC | PRN

## 2018-12-04 MED ORDER — SENNA 8.6 MG PO TABS: 1.0000 | ORAL_TABLET | Freq: Every day | ORAL | 1 refills | Status: DC

## 2018-12-04 MED FILL — ?IBUPROFEN 800 MG TABS: AMNEAL | 30 days supply | Qty: 90 | Fill #0

## 2018-12-04 NOTE — Progress Notes (Signed)
Pt states that after working her right foot is swollen and painful.  Pt had surgery on this foot for plantar fascitis and bunyon

## 2018-12-04 NOTE — Patient Instructions (Signed)
Colesterol elevado High Cholesterol  El colesterol elevado es una afeccin en la que la sangre tiene niveles altos de una sustancia blanca, cerosa y parecida a la grasa (colesterol). El organismo humano necesita una pequea cantidad de colesterol. El hgado fabrica todo el colesterol que el organismo necesita. El exceso de colesterol proviene de los alimentos que comemos. La sangre transporta el colesterol desde el hgado a travs de los vasos sanguneos. Si tiene el colesterol elevado, este puede depositarse (formar placas) en las paredes de los vasos sanguneos (arterias). Las placas provocan el estrechamiento y la rigidez de las arterias. Las placas de colesterol aumentan el riesgo de sufrir un infarto de miocardio y un accidente cerebrovascular. Trabaje con el mdico para mantener las concentraciones de colesterol en un rango saludable. Qu incrementa el riesgo? Es ms probable que esta afeccin se manifieste en las personas que:  Consumen alimentos con alto contenido de grasa animal (grasa saturada) o colesterol.  Tienen sobrepeso.  No hacen suficiente ejercicio fsico.  Tienen antecedentes familiares de colesterol elevado. Cules son los signos o los sntomas? Esta afeccin no presenta sntomas. Cmo se diagnostica? Esta afeccin podra diagnosticarse a partir de los resultados de anlisis de sangre.  Si es mayor de 20aos, es posible que el mdico le controle el colesterol cada 4a6aos.  Los controles pueden ser ms frecuentes si ya tuvo el colesterol elevado u otros factores de riesgo de enfermedades cardacas. En el anlisis de sangre de colesterol, se determina lo siguiente:  El colesterol "malo" (colesterol LDL). Este es el principal tipo de colesterol que causa enfermedades cardacas. El nivel recomendado de LDL es de menos de100.  El colesterol "bueno" (colesterol HDL). Este tipo ayuda a proteger contra las enfermedades cardacas limpiando las arterias y arrastrando el  LDL. El nivel recomendado de HDL es de60 o superior.  Triglicridos. Estos son grasas que el organismo puede almacenar o quemar como fuente de energa. El nivel recomendado de triglicridos es de menos de 150.  Colesterol total. Esta es una medicin de la cantidad total de colesterol en la sangre, que incluye el colesterol LDL, el colesterol HDL y los triglicridos. El valor saludable es de menos de200. Cmo se trata? Esta afeccin se trata con cambios en la dieta y en el estilo de vida, y con medicamentos. Cambios en la dieta  Entre ellos, la ingesta de una mayor cantidad de cereales integrales, frutas, verduras, frutos secos y pescado.  Tambin podran incluir la reduccin del consumo de carnes rojas y alimentos con mucho azcar agregado. Cambios en el estilo de vida  Entre ellos, realizar sesiones de ejercicios aerbicos durante, por lo menos, 40minutos, 3veces por semana. Por ejemplo, caminar, andar en bicicleta y nadar. Los ejercicios aerbicos junto con una dieta sana pueden ayudar a que se mantenga en un peso saludable.  Los cambios tambin podran incluir dejar de fumar. Medicamentos  Por lo general, se administran medicamentos si con los cambios en la alimentacin y en el estilo de vida no se logra reducir el colesterol hasta niveles saludables.  El mdico podra recetarle estatinas. Se ha demostrado que las estatinas disminuyen los niveles de colesterol, lo que puede reducir el riesgo de padecer una enfermedad cardaca. Siga estas indicaciones en su casa: Comida y bebida Si se lo indic el mdico:  Coma pollo (sin piel), pescado, ternera, mariscos, pechuga de pavo molida y cortes de carne roja de pulpa o de lomo.  No coma alimentos fritos ni carnes grasosas, como salchichas y salame.  Coma muchas   frutas, como manzanas.  Coma gran cantidad de verduras, como brcoli, papas y zanahorias.  Coma porotos, guisantes secos y lentejas.  Coma cereales, como cebada, arroz,  cuscs y trigo burgol.  Coma pastas sin salsas con crema.  [place], y coma yogures y quesos descremados o semidescremados.  No coma ni beba [employer], crema, helado, yemas de huevo ni quesos duros.  No coma margarinas en barra ni untables que contengan grasas trans (que tambin se conocen como aceites parcialmente hidrogenados).  No coma aceites tropicales saturados, como el de coco y el de [place].  No coma tortas, galletas, galletitas ni otros productos horneados que contengan grasas trans.  Instrucciones generales  Haga ejercicio segn las indicaciones del mdico. Aumente la cantidad de ejercicio fsico que realiza mediante actividades como jardinera, salir a Writer o usar las escaleras.  Tome los medicamentos de venta libre y los recetados solamente como se lo haya indicado el mdico.  No consuma ningn producto que contenga nicotina o tabaco, como cigarrillos y Psychologist, sport and exercise. Si necesita ayuda para dejar de fumar, consulte al mdico.  Concurra a todas las visitas de control como se lo haya indicado el mdico. Esto es importante. Comunquese con un mdico si:  Tiene dificultad para seguir una dieta sana o mantener un peso saludable.  Necesita ayuda para comenzar un programa de ejercicios.  Necesita ayuda para dejar de fumar. Solicite ayuda de inmediato si:  Electronics engineer.  Tiene dificultad para respirar. Esta informacin no tiene Marine scientist el consejo del mdico. Asegrese de hacerle al mdico cualquier pregunta que tenga. Document Released: 04/18/2005 Document Revised: 07/25/2016 Document Reviewed: 10/17/2015 Elsevier Patient Education  2020 [employer].

## 2018-12-04 NOTE — Progress Notes (Signed)
Acute Office Visit  Subjective:    Patient ID: Lori Morse, female    DOB: 09-Jul-1965, 53 y.o.   MRN: [id number]  Chief Complaint  Patient presents with  . Follow-up    Right foot pain    HPI Patient is in today for patient is in today for right foot pain she had surgery 05/17/2018 for bunionectomy  With open plantar fasciotomy. She works as a Chartered certified accountant and after working several hours her right foot starts to hurt.  Past Medical History:  Diagnosis Date  . Bilateral bunions   . Depression   . Hyperlipidemia   . Left sided sciatica 03/2017   resolved  . Plantar fasciitis, right   . PTSD (post-traumatic stress disorder)   . Pulmonary nodules 04/17/2018   Bilateral, noted on CXR    Past Surgical History:  Procedure Laterality Date  . HALLUX VALGUS AUSTIN Right 05/17/2018   Procedure: HALLUX VALGUS AUSTIN RIGHT FOOT;  Surgeon: [name], DPM;  Location: [facility];  Service: Podiatry;  Laterality: Right;  . PLANTAR FASCIA RELEASE Right 05/17/2018   Procedure: PLANTAR FASCIOTOMY RIGHT FOOT;  Surgeon: [name], DPM;  Location: [facility];  Service: Podiatry;  Laterality: Right;  . Steroid Injections     foot    History reviewed. No pertinent family history.  Social History   Socioeconomic History  . Marital status: Single    Spouse name: Not on file  . Number of children: Not on file  . Years of education: Not on file  . Highest education level: Not on file  Occupational History  . Not on file  Social Needs  . Financial resource strain: Not on file  . Food insecurity    Worry: Not on file    Inability: Not on file  . Transportation needs    Medical: Not on file    Non-medical: Not on file  Tobacco Use  . Smoking status: Never Smoker  . Smokeless tobacco: Never Used  Substance and Sexual Activity  . Alcohol use: Never    Frequency: Never  . Drug use: Never  . Sexual activity: Yes    Birth  control/protection: Post-menopausal  Lifestyle  . Physical activity    Days per week: Not on file    Minutes per session: Not on file  . Stress: Not on file  Relationships  . Social Herbalist on phone: Not on file    Gets together: Not on file    Attends religious service: Not on file    Active member of club or organization: Not on file    Attends meetings of clubs or organizations: Not on file    Relationship status: Not on file  . Intimate partner violence    Fear of current or ex partner: Not on file    Emotionally abused: Not on file    Physically abused: Not on file    Forced sexual activity: Not on file  Other Topics Concern  . Not on file  Social History Narrative  . Not on file    Outpatient Medications Prior to Visit  Medication Sig Dispense Refill  . DULoxetine (CYMBALTA) 30 MG capsule Take 1 capsule (30 mg total) by mouth daily. (Patient not taking: Reported on 12/04/2018) 30 capsule 1  . lovastatin (MEVACOR) 20 MG tablet Take 1 tablet (20 mg total) by mouth at bedtime. (Patient not taking: Reported on 12/04/2018) 30 tablet 3  . meloxicam (  MOBIC) 15 MG tablet Take 1 tablet (15 mg total) by mouth daily. (Patient not taking: Reported on 12/04/2018) 30 tablet 0  . naproxen (NAPROSYN) 500 MG tablet Take 500 mg by mouth 2 (two) times daily with a meal.    . ibuprofen (ADVIL) 800 MG tablet Take 1 tablet (800 mg total) by mouth every 8 (eight) hours as needed. (Patient not taking: Reported on 12/04/2018) 90 tablet 1  . oxyCODONE-acetaminophen (PERCOCET) 5-325 MG tablet Take 1 tablet by mouth every 6 (six) hours as needed for severe pain. 30 tablet 0   No facility-administered medications prior to visit.     No Known Allergies  Review of Systems  Musculoskeletal:       Swollen right foot s/p surgery cont to have pain        Objective:    Physical Exam  Constitutional: She is oriented to person, place, and time. She appears well-developed and well-nourished.   HENT:  Head: Normocephalic.  Eyes: EOM are normal.  Neck: Normal range of motion. Neck supple.  Cardiovascular: Normal rate.  Pulmonary/Chest: Effort normal and breath sounds normal.  Abdominal: Soft. Bowel sounds are normal.  Musculoskeletal: Normal range of motion.        General: Tenderness present.     Comments: Foot ( right )  Neurological: She is oriented to person, place, and time.  Skin: Skin is warm and dry.  Psychiatric: She has a normal mood and affect.    BP 112/71 (BP Location: Right Arm, Patient Position: Sitting, Cuff Size: Normal)   Pulse 62   Temp 98.6 F (37 C) (Tympanic)   Ht _0  (1.575 m)   Wt 184 lb (83.5 kg)   LMP 04/07/2018 (Exact Date)   SpO2 96%   BMI 33.65 kg/m  Wt Readings from Last 3 Encounters:  12/04/18 184 lb (83.5 kg)  05/29/18 185 lb 6.4 oz (84.1 kg)  05/17/18 183 lb 12.8 oz (83.4 kg)    Health Maintenance Due  Topic Date Due  . INFLUENZA VACCINE  12/01/2018    There are no preventive care reminders to display for this patient.   No results found for: TSH Lab Results  Component Value Date   WBC 5.0 04/17/2018   HGB 13.9 04/17/2018   HCT 41.9 04/17/2018   MCV 89 04/17/2018   PLT 248 04/17/2018   Lab Results  Component Value Date   NA 142 04/17/2018   K 4.0 04/17/2018   CO2 23 04/17/2018   GLUCOSE 89 04/17/2018   BUN 16 04/17/2018   CREATININE 0.71 04/17/2018   BILITOT 0.4 04/17/2018   ALKPHOS 85 04/17/2018   AST 19 04/17/2018   ALT 15 04/17/2018   PROT 7.3 04/17/2018   ALBUMIN 4.5 04/17/2018   CALCIUM 9.4 04/17/2018   Lab Results  Component Value Date   CHOL 153 11/07/2017   Lab Results  Component Value Date   HDL 40 11/07/2017   Lab Results  Component Value Date   LDLCALC 92 11/07/2017   Lab Results  Component Value Date   TRIG 107 11/07/2017   Lab Results  Component Value Date   CHOLHDL 3.8 11/07/2017   No results found for: HGBA1C     Assessment & Plan:   Problem List Items Addressed This  Visit    None    Visit Diagnoses    Hyperlipidemia, unspecified hyperlipidemia type    -  Primary   Relevant Orders   CBC with Differential/Platelet   Lipid Panel  CMP14+EGFR   Right foot pain       Relevant Medications   ibuprofen (ADVIL) 800 MG tablet   Morbid obesity ([facility])       Relevant Orders   Lipid Panel   CMP14+EGFR     Kaytee was seen today for follow-up.  Diagnoses and all orders for this visit:  Hyperlipidemia, unspecified hyperlipidemia type Counseled on blood pressure goal of less than 130/80, low-sodium, DASH diet, medication compliance, 150 minutes of moderate intensity exercise per week. Discussed medication compliance, adverse effects. -     CBC with Differential/Platelet; Future -     Lipid Panel; Future -     CMP14+EGFR; Future  Right foot pain -     ibuprofen (ADVIL) 800 MG tablet; Take 1 tablet (800 mg total) by mouth every 8 (eight) hours as needed.  Morbid obesity ([facility])\ Obesity is 30-39 indicating an excess in caloric intake or underlining conditions. This may lead to other co-morbidities. Lifestyle modifications of diet and exercise may reduce obesity.  -     Lipid Panel; Future -     CMP14+EGFR; Future  Constipation MANAGEMENT OF CHRONIC CONSTIPATION   Drink fluids in the recommended amount everyday. Recommend amount is 8 cups of water daily. Do not replace water with Gatorade or Powerade as these should only be used when you are dehydrated.   Eat lots of high fiber foods-fruits, veggies, bran and whole grain instead of white bread  Be active everyday. Inactivity makes constipation worse.  Add psyllium daily (Metamucil) which comes in capsules now. Start very low dose and work up to recommended dose on bottle daily.  Stay away from [place] or any magnesium containing laxative, unless you need it to clear things out rarely. It is an addictive laxative and your gut will become dependent on it. Other orders -     senna (SENOKOT) 8.6  MG TABS tablet; Take 1 tablet (8.6 mg total) by mouth daily.    Meds ordered this encounter  Medications  . ibuprofen (ADVIL) 800 MG tablet    Sig: Take 1 tablet (800 mg total) by mouth every 8 (eight) hours as needed.    Dispense:  90 tablet    Refill:  1  . senna (SENOKOT) 8.6 MG TABS tablet    Sig: Take 1 tablet (8.6 mg total) by mouth daily.    Dispense:  120 tablet    Refill:  1     Kerin Perna, NP

## 2018-12-05 ENCOUNTER — Ambulatory Visit (INDEPENDENT_AMBULATORY_CARE_PROVIDER_SITE_OTHER): Payer: Self-pay | Admitting: Primary Care

## 2018-12-21 ENCOUNTER — Ambulatory Visit: Payer: Self-pay | Attending: Family Medicine

## 2018-12-21 ENCOUNTER — Other Ambulatory Visit: Payer: Self-pay

## 2019-02-20 ENCOUNTER — Encounter: Payer: Self-pay | Admitting: Podiatry

## 2019-02-20 ENCOUNTER — Other Ambulatory Visit: Payer: Self-pay

## 2019-02-20 ENCOUNTER — Ambulatory Visit: Payer: No Typology Code available for payment source

## 2019-02-20 ENCOUNTER — Ambulatory Visit: Payer: No Typology Code available for payment source | Admitting: Podiatry

## 2019-02-20 DIAGNOSIS — M21621 Bunionette of right foot: Secondary | ICD-10-CM

## 2019-02-20 DIAGNOSIS — M2012 Hallux valgus (acquired), left foot: Secondary | ICD-10-CM

## 2019-02-20 NOTE — Patient Instructions (Addendum)
Pre-Operative Instructions  Congratulations, you have decided to take an important step towards improving your quality of life.  You can be assured that the doctors and staff at [facility] will be with you every step of the way.  Here are some important things you should know:  1. Plan to be at the surgery center/hospital at least 1 (one) hour prior to your scheduled time, unless otherwise directed by the surgical center/hospital staff.  You must have a responsible adult accompany you, remain during the surgery and drive you home.  Make sure you have directions to the surgical center/hospital to ensure you arrive on time. 2. If you are having surgery at [facility] or [facility] hospitals, you will need a copy of your medical history and physical form from your family physician within one month prior to the date of surgery. We will give you a form for your primary physician to complete.  3. We make every effort to accommodate the date you request for surgery.  However, there are times where surgery dates or times have to be moved.  We will contact you as soon as possible if a change in schedule is required.   4. No aspirin/ibuprofen for one week before surgery.  If you are on aspirin, any non-steroidal anti-inflammatory medications (Mobic, Aleve, Ibuprofen) should not be taken seven (7) days prior to your surgery.  You make take Tylenol for pain prior to surgery.  5. Medications - If you are taking daily heart and blood pressure medications, seizure, reflux, allergy, asthma, anxiety, pain or diabetes medications, make sure you notify the surgery center/hospital before the day of surgery so they can tell you which medications you should take or avoid the day of surgery. 6. No food or drink after midnight the night before surgery unless directed otherwise by surgical center/hospital staff. 7. No alcoholic beverages 24-hours prior to surgery.  No smoking 24-hours prior or 24-hours after  surgery. 8. Wear loose pants or shorts. They should be loose enough to fit over bandages, boots, and casts. 9. Don't wear slip-on shoes. Sneakers are preferred. 10. Bring your boot with you to the surgery center/hospital.  Also bring crutches or a walker if your physician has prescribed it for you.  If you do not have this equipment, it will be provided for you after surgery. 11. If you have not been contacted by the surgery center/hospital by the day before your surgery, call to confirm the date and time of your surgery. 12. Leave-time from work may vary depending on the type of surgery you have.  Appropriate arrangements should be made prior to surgery with your employer. 13. Prescriptions will be provided immediately following surgery by your doctor.  Fill these as soon as possible after surgery and take the medication as directed. Pain medications will not be refilled on weekends and must be approved by the doctor. 14. Remove nail polish on the operative foot and avoid getting pedicures prior to surgery. 15. Wash the night before surgery.  The night before surgery wash the foot and leg well with water and the antibacterial soap provided. Be sure to pay special attention to beneath the toenails and in between the toes.  Wash for at least three (3) minutes. Rinse thoroughly with water and dry well with a towel.  Perform this wash unless told not to do so by your physician.  Enclosed: 1 Ice pack (please put in freezer the night before surgery)   1 Hibiclens skin cleaner     Pre-op instructions  If you have any questions regarding the instructions, please do not hesitate to call our office.  [place]: [address], , [place] [address] -- [phone]  [place]: [address] [place], [place] [address] -- [phone]  [place]: [address]  [place], [place] [address] -- [phone]   Website: [email or web address] 

## 2019-02-20 NOTE — Progress Notes (Signed)
   Subjective: 53 y.o. female presenting today with a chief complaint of pain to the medial left great toe secondary to a bunion that has been present for the past few years. She also reports pain to the lateral right 5th toe secondary to a Tailor's bunion that has been worsening over the past few years as well. Wearing shoes and applying pressure to the areas increases the pain. She has been taking OTC pain medication and wearing wide fitting shoes for treatment. Patient is here for further evaluation and treatment.   Past Medical History:  Diagnosis Date  . Bilateral bunions   . Depression   . Hyperlipidemia   . Left sided sciatica 03/2017   resolved  . Plantar fasciitis, right   . PTSD (post-traumatic stress disorder)   . Pulmonary nodules 04/17/2018   Bilateral, noted on CXR     Objective: Physical Exam General: The patient is alert and oriented x3 in no acute distress.  Dermatology: Skin is cool, dry and supple bilateral lower extremities. Negative for open lesions or macerations.  Vascular: Palpable pedal pulses bilaterally. No edema or erythema noted. Capillary refill within normal limits.  Neurological: Epicritic and protective threshold grossly intact bilaterally.   Musculoskeletal Exam: Clinical evidence of Tailor's bunion deformity noted to the respective foot. There is a moderate pain on palpation range of motion of the fifth MPJ.  Clinical evidence of bunion deformity noted to the respective foot. There is moderate pain on palpation range of motion of the first MPJ. Lateral deviation of the hallux noted consistent with hallux abductovalgus.  Radiographic Exam: Increased intermetatarsal angle to the fourth interspace of the respective foot. Prominent fifth metatarsal head. Joint spaces preserved. Increased intermetatarsal angle greater than 15 with a hallux abductus angle greater than 30 noted on AP view. Moderate degenerative changes noted within the first MPJ.    Assessment: 1. 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 Patient was evaluated. X-Rays reviewed.  2. Today we discussed the conservative versus surgical management of the presenting pathology. The patient opts for surgical management. All possible complications and details of the procedure were explained. All patient questions were answered. No guarantees were expressed or implied. 3. Authorization for surgery was initiated today. Surgery will consist of bunionectomy with metatarsal osteotomy left; Tailor's bunionectomy with osteotomy right.  4. Return to clinic in one week post op.       [name], DPM [facility]  Dr. [name], [address]                                        [place], [place] [address]                Office [phone]  Fax [phone]

## 2019-04-30 IMAGING — DX DG CHEST 2V
2 series · 2 of 2 positions shown · non-contrast
Comparison: No prior.

CLINICAL DATA: Foot surgery.  Preoperative examination.

EXAM:
CHEST - 2 VIEW

[chest pa]
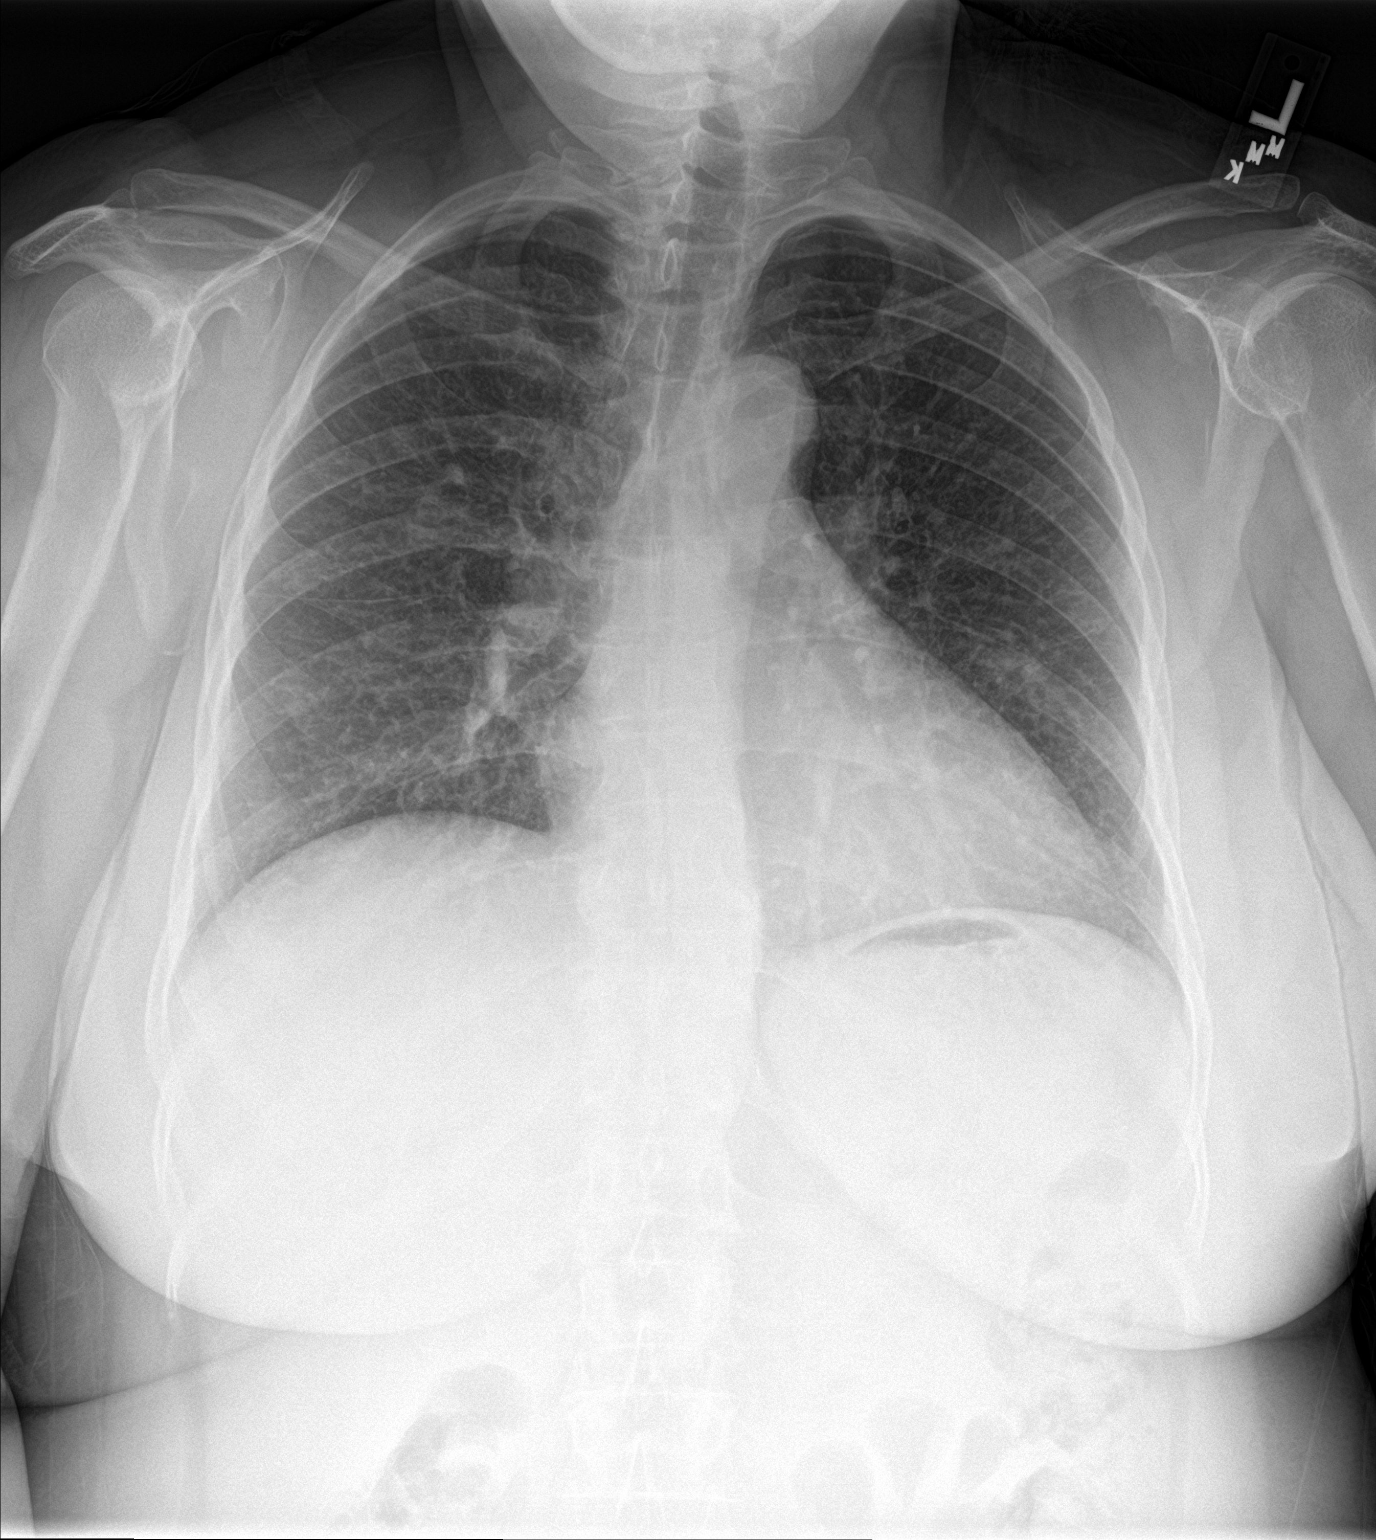

[chest lat]
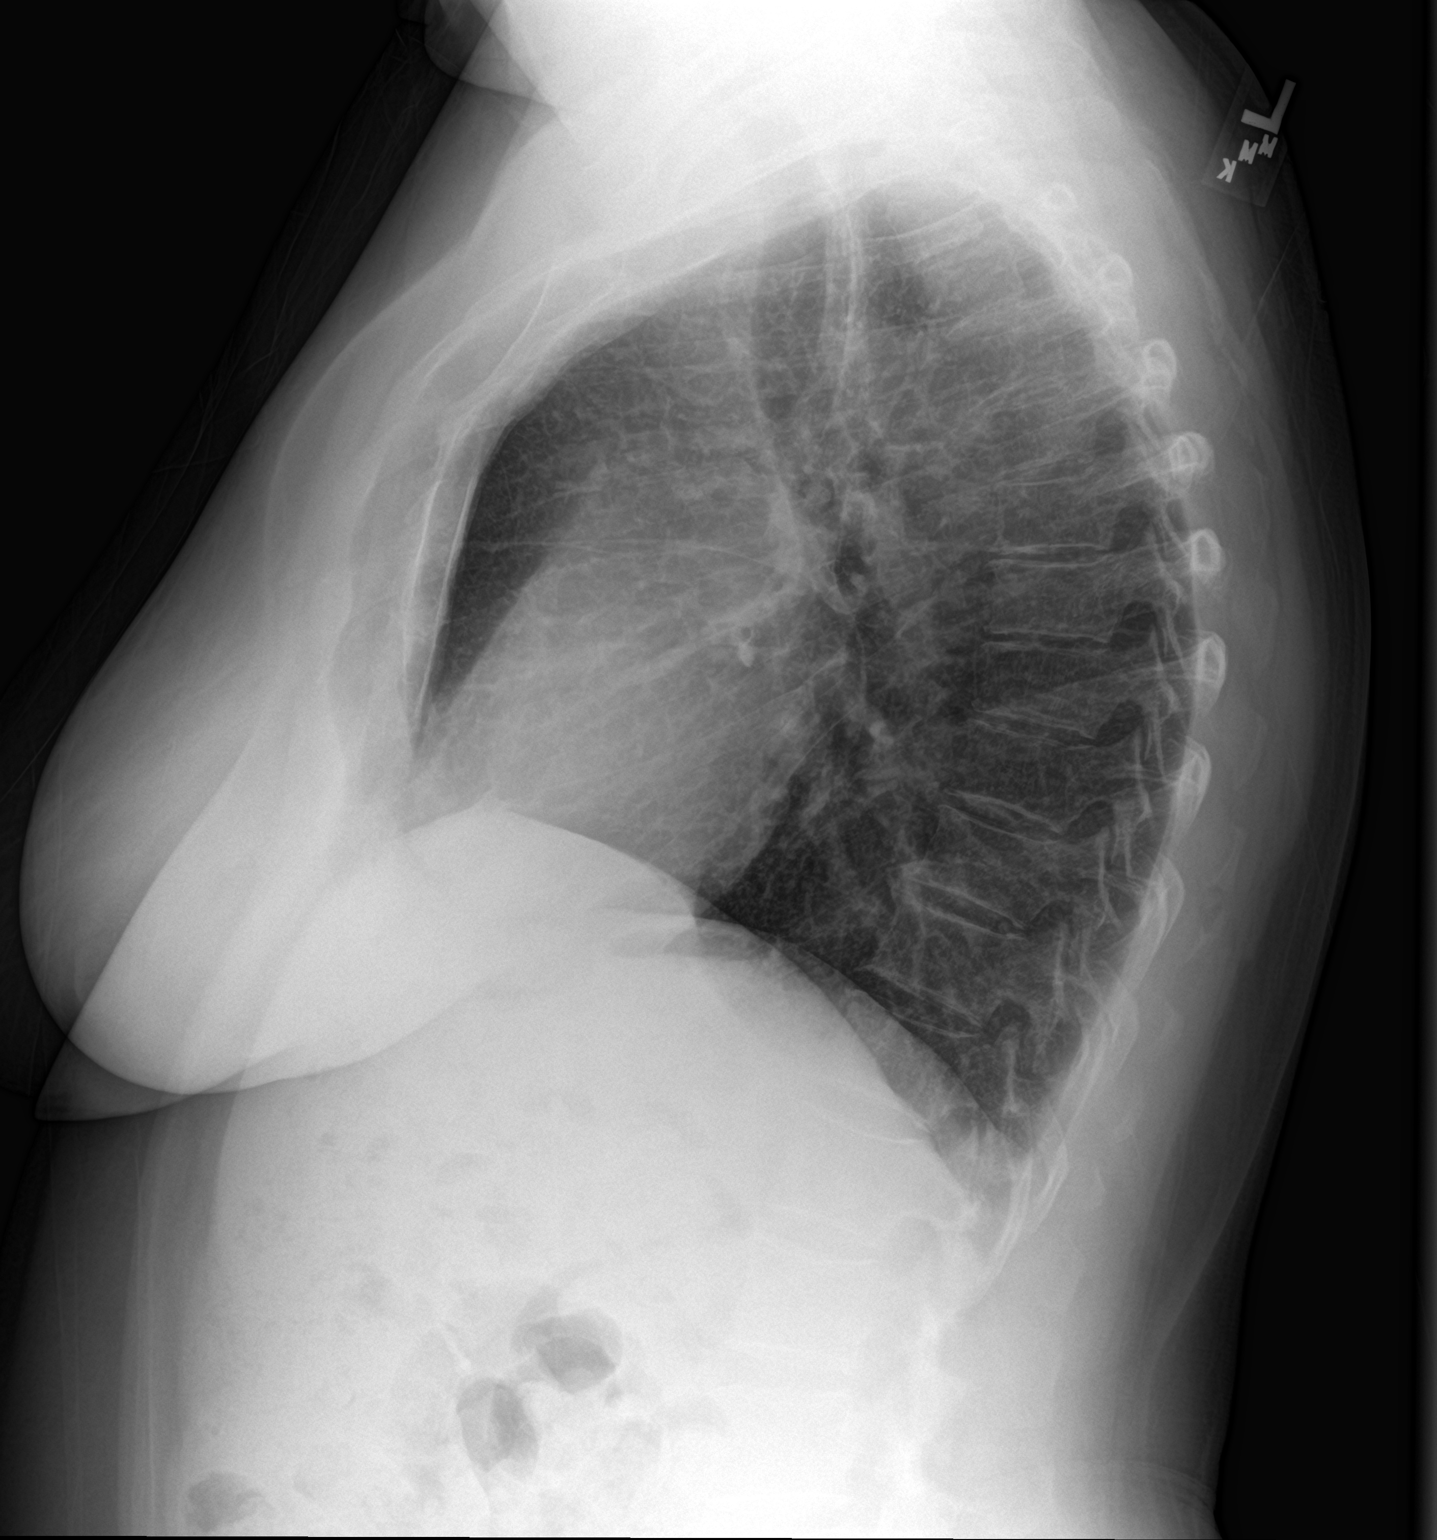

[2 of 2 positions shown; findings below may reference images not displayed]

FINDINGS: Mediastinum and hilar structures normal. Cardiomegaly with normal
pulmonary vascularity. No focal infiltrate. Bilateral pulmonary
nodules are noted. Nonenhanced chest CT is suggested to further
evaluate. No pleural effusion or pneumothorax. Thoracic spine
degenerative changes scoliosis.
IMPRESSION: 1. Bilateral pulmonary nodules are noted. Nonenhanced chest CT is
suggested to further evaluate.

2.  No acute infiltrate.

## 2019-05-08 ENCOUNTER — Encounter (HOSPITAL_BASED_OUTPATIENT_CLINIC_OR_DEPARTMENT_OTHER): Payer: Self-pay | Admitting: Podiatry

## 2019-05-08 ENCOUNTER — Telehealth: Payer: Self-pay | Admitting: *Deleted

## 2019-05-08 NOTE — Telephone Encounter (Signed)
"  I just spoke to Bucklin and I tried to explain to her that she needs a H&P.  I tried to explain it to her but I'm not sure she understood what I was saying.  Please call her tomorrow with an interpreter and let her know.  She goes to [facility] on [address].  They can do the H&P in Epic.  Explain to her she has to have the H&P or the surgery will be canceled.

## 2019-05-09 NOTE — Telephone Encounter (Signed)
[name] has a scheduled visit at [facility] on 05/14/2019 at 2:50 pm.

## 2019-05-10 ENCOUNTER — Other Ambulatory Visit: Payer: Self-pay

## 2019-05-13 ENCOUNTER — Other Ambulatory Visit (HOSPITAL_COMMUNITY)
Admission: RE | Admit: 2019-05-13 | Discharge: 2019-05-13 | Disposition: A | Discharge status: Home / Self Care | Payer: HRSA Program | Source: Ambulatory Visit | Attending: Podiatry | Admitting: Podiatry

## 2019-05-13 ENCOUNTER — Telehealth: Payer: Self-pay | Admitting: Podiatry

## 2019-05-13 DIAGNOSIS — Z20822 Contact with and (suspected) exposure to covid-19: Secondary | ICD-10-CM | POA: Diagnosis not present

## 2019-05-13 DIAGNOSIS — Z01812 Encounter for preprocedural laboratory examination: Secondary | ICD-10-CM | POA: Insufficient documentation

## 2019-05-13 NOTE — Telephone Encounter (Signed)
Need orders entered for sx on 05/16/19 at [place].

## 2019-05-14 ENCOUNTER — Other Ambulatory Visit: Payer: Self-pay

## 2019-05-14 ENCOUNTER — Encounter (INDEPENDENT_AMBULATORY_CARE_PROVIDER_SITE_OTHER): Payer: Self-pay | Admitting: Primary Care

## 2019-05-14 ENCOUNTER — Ambulatory Visit (INDEPENDENT_AMBULATORY_CARE_PROVIDER_SITE_OTHER): Payer: Self-pay | Admitting: Primary Care

## 2019-05-14 VITALS — BP 100/60 | HR 58 | Temp 97.3°F | Ht 63.0 in | Wt 189.6 lb

## 2019-05-14 DIAGNOSIS — E785 Hyperlipidemia, unspecified: Secondary | ICD-10-CM

## 2019-05-14 DIAGNOSIS — Z01818 Encounter for other preprocedural examination: Secondary | ICD-10-CM

## 2019-05-14 DIAGNOSIS — Z23 Encounter for immunization: Secondary | ICD-10-CM

## 2019-05-14 DIAGNOSIS — M79671 Pain in right foot: Secondary | ICD-10-CM

## 2019-05-14 LAB — NOVEL CORONAVIRUS, NAA (HOSP ORDER, SEND-OUT TO REF LAB; TAT 18-24 HRS): SARS-CoV-2, NAA: NOT DETECTED

## 2019-05-14 NOTE — H&P (View-Only) (Signed)
Established Patient Office Visit  Subjective:  Patient ID: Lori Morse, female    DOB: [birth year]  Age: 54 y.o. MRN: [id number]  CC:  Chief Complaint  Patient presents with  . Annual Exam    for surgery    HPI Lori Morse presents for a medical clearance for [facility] Bunionectomy right and metatarsal left foot.     Past Medical History:  Diagnosis Date  . Bilateral bunions   . Depression   . Hyperlipidemia   . Left sided sciatica 03/2017   resolved  . Plantar fasciitis, right   . PTSD (post-traumatic stress disorder)   . Pulmonary nodules 04/17/2018   Bilateral, noted on CXR    Past Surgical History:  Procedure Laterality Date  . HALLUX VALGUS AUSTIN Right 05/17/2018   Procedure: HALLUX VALGUS AUSTIN RIGHT FOOT;  Surgeon: [name], DPM;  Location: [place];  Service: Podiatry;  Laterality: Right;  . PLANTAR FASCIA RELEASE Right 05/17/2018   Procedure: PLANTAR FASCIOTOMY RIGHT FOOT;  Surgeon: [name], DPM;  Location: [facility];  Service: Podiatry;  Laterality: Right;  . Steroid Injections     foot    No family history on file.  Social History   Socioeconomic History  . Marital status: Single    Spouse name: Not on file  . Number of children: Not on file  . Years of education: Not on file  . Highest education level: Not on file  Occupational History  . Not on file  Tobacco Use  . Smoking status: Never Smoker  . Smokeless tobacco: Never Used  Substance and Sexual Activity  . Alcohol use: Never  . Drug use: Never  . Sexual activity: Yes    Birth control/protection: Post-menopausal  Other Topics Concern  . Not on file  Social History Narrative  . Not on file   Social Determinants of Health   Financial Resource Strain:   . Difficulty of Paying Living Expenses: Not on file  Food Insecurity:   . Worried About Charity fundraiser in the Last Year: Not on file  . Ran Out of Food in the Last  Year: Not on file  Transportation Needs:   . Lack of Transportation (Medical): Not on file  . Lack of Transportation (Non-Medical): Not on file  Physical Activity:   . Days of Exercise per Week: Not on file  . Minutes of Exercise per Session: Not on file  Stress:   . Feeling of Stress : Not on file  Social Connections:   . Frequency of Communication with Friends and Family: Not on file  . Frequency of Social Gatherings with Friends and Family: Not on file  . Attends Religious Services: Not on file  . Active Member of Clubs or Organizations: Not on file  . Attends Archivist Meetings: Not on file  . Marital Status: Not on file  Intimate Partner Violence:   . Fear of Current or Ex-Partner: Not on file  . Emotionally Abused: Not on file  . Physically Abused: Not on file  . Sexually Abused: Not on file    Outpatient Medications Prior to Visit  Medication Sig Dispense Refill  . DULoxetine (CYMBALTA) 30 MG capsule Take 1 capsule (30 mg total) by mouth daily. (Patient not taking: Reported on 12/04/2018) 30 capsule 1  . ibuprofen (ADVIL) 800 MG tablet Take 1 tablet (800 mg total) by mouth every 8 (eight) hours as needed. (Patient not taking: Reported on  05/14/2019) 90 tablet 1  . lovastatin (MEVACOR) 20 MG tablet Take 1 tablet (20 mg total) by mouth at bedtime. (Patient not taking: Reported on 12/04/2018) 30 tablet 3  . meloxicam (MOBIC) 15 MG tablet Take 1 tablet (15 mg total) by mouth daily. (Patient not taking: Reported on 12/04/2018) 30 tablet 0   No facility-administered medications prior to visit.    No Known Allergies  ROS Review of Systems  All other systems reviewed and are negative.     Objective:    Physical Exam  Constitutional: She is oriented to person, place, and time. She appears well-developed and well-nourished.  obese  HENT:  Head: Normocephalic.  Eyes: Pupils are equal, round, and reactive to light. EOM are normal.  Cardiovascular: Normal rate and  regular rhythm.  Pulmonary/Chest: Effort normal and breath sounds normal.  Abdominal: Soft. Bowel sounds are normal.  Musculoskeletal:        General: Normal range of motion.     Cervical back: Normal range of motion and neck supple.  Neurological: She is oriented to person, place, and time. She has normal reflexes.  Skin: Skin is warm and dry.  Psychiatric: She has a normal mood and affect. Her behavior is normal. Judgment and thought content normal.    BP 100/60 (BP Location: Right Arm, Patient Position: Sitting, Cuff Size: Large)   Pulse (!) 58   Temp (!) 97.3 F (36.3 C) (Temporal)   Ht _0  (1.6 m)   Wt 189 lb 9.6 oz (86 kg)   LMP 04/07/2018 (Exact Date)   SpO2 94%   BMI 33.59 kg/m  Wt Readings from Last 3 Encounters:  05/14/19 189 lb 9.6 oz (86 kg)  12/04/18 184 lb (83.5 kg)  05/29/18 185 lb 6.4 oz (84.1 kg)     There are no preventive care reminders to display for this patient.  There are no preventive care reminders to display for this patient.  No results found for: TSH Lab Results  Component Value Date   WBC 5.0 04/17/2018   HGB 13.9 04/17/2018   HCT 41.9 04/17/2018   MCV 89 04/17/2018   PLT 248 04/17/2018   Lab Results  Component Value Date   NA 142 04/17/2018   K 4.0 04/17/2018   CO2 23 04/17/2018   GLUCOSE 89 04/17/2018   BUN 16 04/17/2018   CREATININE 0.71 04/17/2018   BILITOT 0.4 04/17/2018   ALKPHOS 85 04/17/2018   AST 19 04/17/2018   ALT 15 04/17/2018   PROT 7.3 04/17/2018   ALBUMIN 4.5 04/17/2018   CALCIUM 9.4 04/17/2018   Lab Results  Component Value Date   CHOL 153 11/07/2017   Lab Results  Component Value Date   HDL 40 11/07/2017   Lab Results  Component Value Date   LDLCALC 92 11/07/2017   Lab Results  Component Value Date   TRIG 107 11/07/2017   Lab Results  Component Value Date   CHOLHDL 3.8 11/07/2017   No results found for: HGBA1C    Assessment & Plan:  Lori Morse was seen today for annual exam.  Diagnoses and  all orders for this visit:  Right foot pain  Hyperlipidemia, unspecified hyperlipidemia type  Decrease your fatty foods, red meat, cheese, milk and increase fiber like whole grains and veggies. You can also add a fiber supplement like Metamucil or Benefiber.  -     Lipid Panel  Preoperative general physical examination -     EKG 12-Lead -     CBC  with Differential -     CMP14+EGFR; Future -     DG Chest 2 View; Future -     CMP14+EGFR  Need for immunization against influenza -     Flu Vaccine QUAD 36+ mos IM    No orders of the defined types were placed in this encounter.   Follow-up: No follow-ups on file.    Kerin Perna, NP

## 2019-05-14 NOTE — Progress Notes (Signed)
Established Patient Office Visit  Subjective:  Patient ID: Lori Morse, female    DOB: [birth year]  Age: 54 y.o. MRN: [id number]  CC:  Chief Complaint  Patient presents with  . Annual Exam    for surgery    HPI Lori Morse presents for a medical clearance for [facility] Bunionectomy right and metatarsal left foot.     Past Medical History:  Diagnosis Date  . Bilateral bunions   . Depression   . Hyperlipidemia   . Left sided sciatica 03/2017   resolved  . Plantar fasciitis, right   . PTSD (post-traumatic stress disorder)   . Pulmonary nodules 04/17/2018   Bilateral, noted on CXR    Past Surgical History:  Procedure Laterality Date  . HALLUX VALGUS AUSTIN Right 05/17/2018   Procedure: HALLUX VALGUS [place]IN RIGHT FOOT;  Surgeon: [name], DPM;  Location: [place];  Service: Podiatry;  Laterality: Right;  . PLANTAR FASCIA RELEASE Right 05/17/2018   Procedure: PLANTAR FASCIOTOMY RIGHT FOOT;  Surgeon: [name], DPM;  Location: [facility];  Service: Podiatry;  Laterality: Right;  . Steroid Injections     foot    No family history on file.  Social History   Socioeconomic History  . Marital status: Single    Spouse name: Not on file  . Number of children: Not on file  . Years of education: Not on file  . Highest education level: Not on file  Occupational History  . Not on file  Tobacco Use  . Smoking status: Never Smoker  . Smokeless tobacco: Never Used  Substance and Sexual Activity  . Alcohol use: Never  . Drug use: Never  . Sexual activity: Yes    Birth control/protection: Post-menopausal  Other Topics Concern  . Not on file  Social History Narrative  . Not on file   Social Determinants of Health   Financial Resource Strain:   . Difficulty of Paying Living Expenses: Not on file  Food Insecurity:   . Worried About Charity fundraiser in the Last Year: Not on file  . Ran Out of Food in the Last  Year: Not on file  Transportation Needs:   . Lack of Transportation (Medical): Not on file  . Lack of Transportation (Non-Medical): Not on file  Physical Activity:   . Days of Exercise per Week: Not on file  . Minutes of Exercise per Session: Not on file  Stress:   . Feeling of Stress : Not on file  Social Connections:   . Frequency of Communication with Friends and Family: Not on file  . Frequency of Social Gatherings with Friends and Family: Not on file  . Attends Religious Services: Not on file  . Active Member of Clubs or Organizations: Not on file  . Attends Archivist Meetings: Not on file  . Marital Status: Not on file  Intimate Partner Violence:   . Fear of Current or Ex-Partner: Not on file  . Emotionally Abused: Not on file  . Physically Abused: Not on file  . Sexually Abused: Not on file    Outpatient Medications Prior to Visit  Medication Sig Dispense Refill  . DULoxetine (CYMBALTA) 30 MG capsule Take 1 capsule (30 mg total) by mouth daily. (Patient not taking: Reported on 12/04/2018) 30 capsule 1  . ibuprofen (ADVIL) 800 MG tablet Take 1 tablet (800 mg total) by mouth every 8 (eight) hours as needed. (Patient not taking: Reported on  05/14/2019) 90 tablet 1  . lovastatin (MEVACOR) 20 MG tablet Take 1 tablet (20 mg total) by mouth at bedtime. (Patient not taking: Reported on 12/04/2018) 30 tablet 3  . meloxicam (MOBIC) 15 MG tablet Take 1 tablet (15 mg total) by mouth daily. (Patient not taking: Reported on 12/04/2018) 30 tablet 0   No facility-administered medications prior to visit.    No Known Allergies  ROS Review of Systems  All other systems reviewed and are negative.     Objective:    Physical Exam  Constitutional: She is oriented to person, place, and time. She appears well-developed and well-nourished.  obese  HENT:  Head: Normocephalic.  Eyes: Pupils are equal, round, and reactive to light. EOM are normal.  Cardiovascular: Normal rate and  regular rhythm.  Pulmonary/Chest: Effort normal and breath sounds normal.  Abdominal: Soft. Bowel sounds are normal.  Musculoskeletal:        General: Normal range of motion.     Cervical back: Normal range of motion and neck supple.  Neurological: She is oriented to person, place, and time. She has normal reflexes.  Skin: Skin is warm and dry.  Psychiatric: She has a normal mood and affect. Her behavior is normal. Judgment and thought content normal.    BP 100/60 (BP Location: Right Arm, Patient Position: Sitting, Cuff Size: Large)   Pulse (!) 58   Temp (!) 97.3 F (36.3 C) (Temporal)   Ht _0  (1.6 m)   Wt 189 lb 9.6 oz (86 kg)   LMP 04/07/2018 (Exact Date)   SpO2 94%   BMI 33.59 kg/m  Wt Readings from Last 3 Encounters:  05/14/19 189 lb 9.6 oz (86 kg)  12/04/18 184 lb (83.5 kg)  05/29/18 185 lb 6.4 oz (84.1 kg)     There are no preventive care reminders to display for this patient.  There are no preventive care reminders to display for this patient.  No results found for: TSH Lab Results  Component Value Date   WBC 5.0 04/17/2018   HGB 13.9 04/17/2018   HCT 41.9 04/17/2018   MCV 89 04/17/2018   PLT 248 04/17/2018   Lab Results  Component Value Date   NA 142 04/17/2018   K 4.0 04/17/2018   CO2 23 04/17/2018   GLUCOSE 89 04/17/2018   BUN 16 04/17/2018   CREATININE 0.71 04/17/2018   BILITOT 0.4 04/17/2018   ALKPHOS 85 04/17/2018   AST 19 04/17/2018   ALT 15 04/17/2018   PROT 7.3 04/17/2018   ALBUMIN 4.5 04/17/2018   CALCIUM 9.4 04/17/2018   Lab Results  Component Value Date   CHOL 153 11/07/2017   Lab Results  Component Value Date   HDL 40 11/07/2017   Lab Results  Component Value Date   LDLCALC 92 11/07/2017   Lab Results  Component Value Date   TRIG 107 11/07/2017   Lab Results  Component Value Date   CHOLHDL 3.8 11/07/2017   No results found for: HGBA1C    Assessment & Plan:  Lori Morse was seen today for annual exam.  Diagnoses and  all orders for this visit:  Right foot pain  Hyperlipidemia, unspecified hyperlipidemia type  Decrease your fatty foods, red meat, cheese, milk and increase fiber like whole grains and veggies. You can also add a fiber supplement like Metamucil or Benefiber.  -     Lipid Panel  Preoperative general physical examination -     EKG 12-Lead -     CBC  with Differential -     CMP14+EGFR; Future -     DG Chest 2 View; Future -     CMP14+EGFR  Need for immunization against influenza -     Flu Vaccine QUAD 36+ mos IM    No orders of the defined types were placed in this encounter.   Follow-up: No follow-ups on file.    Kerin Perna, NP

## 2019-05-15 ENCOUNTER — Ambulatory Visit (HOSPITAL_COMMUNITY)
Admission: RE | Admit: 2019-05-15 | Discharge: 2019-05-15 | Disposition: A | Discharge status: Home / Self Care | Payer: Self-pay | Source: Ambulatory Visit | Attending: Primary Care | Admitting: Primary Care

## 2019-05-15 DIAGNOSIS — Z01818 Encounter for other preprocedural examination: Secondary | ICD-10-CM | POA: Insufficient documentation

## 2019-05-15 LAB — CBC WITH DIFFERENTIAL/PLATELET
Basophils Absolute: 0 10*3/uL (ref 0.0–0.2)
Basos: 1 %
EOS (ABSOLUTE): 0.1 10*3/uL (ref 0.0–0.4)
Eos: 2 %
Hematocrit: 42.4 % (ref 34.0–46.6)
Hemoglobin: 15 g/dL (ref 11.1–15.9)
Immature Grans (Abs): 0 10*3/uL (ref 0.0–0.1)
Immature Granulocytes: 0 %
Lymphocytes Absolute: 2.3 10*3/uL (ref 0.7–3.1)
Lymphs: 32 %
MCH: 31.6 pg (ref 26.6–33.0)
MCHC: 35.4 g/dL (ref 31.5–35.7)
MCV: 89 fL (ref 79–97)
Monocytes Absolute: 0.6 10*3/uL (ref 0.1–0.9)
Monocytes: 8 %
Neutrophils Absolute: 4.1 10*3/uL (ref 1.4–7.0)
Neutrophils: 57 %
Platelets: 277 10*3/uL (ref 150–450)
RBC: 4.75 x10E6/uL (ref 3.77–5.28)
RDW: 12.9 % (ref 11.7–15.4)
WBC: 7.1 10*3/uL (ref 3.4–10.8)

## 2019-05-15 LAB — CMP14+EGFR
ALT: 28 IU/L (ref 0–32)
AST: 28 IU/L (ref 0–40)
Albumin/Globulin Ratio: 1.6 (ref 1.2–2.2)
Albumin: 4.3 g/dL (ref 3.8–4.9)
Alkaline Phosphatase: 92 IU/L (ref 39–117)
BUN/Creatinine Ratio: 23 (ref 9–23)
BUN: 18 mg/dL (ref 6–24)
Bilirubin Total: 0.4 mg/dL (ref 0.0–1.2)
CO2: 27 mmol/L (ref 20–29)
Calcium: 9.4 mg/dL (ref 8.7–10.2)
Chloride: 101 mmol/L (ref 96–106)
Creatinine, Ser: 0.78 mg/dL (ref 0.57–1.00)
GFR calc Af Amer: 102 mL/min/{1.73_m2} (ref >59)
GFR calc non Af Amer: 88 mL/min/{1.73_m2} (ref >59)
Globulin, Total: 2.7 g/dL (ref 1.5–4.5)
Glucose: 97 mg/dL (ref 65–99)
Potassium: 3.9 mmol/L (ref 3.5–5.2)
Sodium: 138 mmol/L (ref 134–144)
Total Protein: 7 g/dL (ref 6.0–8.5)

## 2019-05-15 LAB — LIPID PANEL
Chol/HDL Ratio: 4.1 ratio (ref 0.0–4.4)
Cholesterol, Total: 178 mg/dL (ref 100–199)
HDL: 43 mg/dL (ref >39)
LDL Chol Calc (NIH): 95 mg/dL (ref 0–99)
Triglycerides: 237 mg/dL — ABNORMAL HIGH (ref 0–149)
VLDL Cholesterol Cal: 40 mg/dL (ref 5–40)

## 2019-05-16 ENCOUNTER — Other Ambulatory Visit: Payer: Self-pay

## 2019-05-16 ENCOUNTER — Encounter (HOSPITAL_BASED_OUTPATIENT_CLINIC_OR_DEPARTMENT_OTHER)
Admission: RE | Disposition: A | Discharge status: Home / Self Care | Payer: Self-pay | Source: Home / Self Care | Attending: Podiatry

## 2019-05-16 ENCOUNTER — Other Ambulatory Visit (INDEPENDENT_AMBULATORY_CARE_PROVIDER_SITE_OTHER): Payer: Self-pay | Admitting: Primary Care

## 2019-05-16 ENCOUNTER — Ambulatory Visit (HOSPITAL_BASED_OUTPATIENT_CLINIC_OR_DEPARTMENT_OTHER): Payer: Self-pay | Admitting: Anesthesiology

## 2019-05-16 ENCOUNTER — Telehealth: Payer: Self-pay | Admitting: Podiatry

## 2019-05-16 ENCOUNTER — Encounter (HOSPITAL_BASED_OUTPATIENT_CLINIC_OR_DEPARTMENT_OTHER): Payer: Self-pay | Admitting: Podiatry

## 2019-05-16 ENCOUNTER — Ambulatory Visit (HOSPITAL_COMMUNITY)
Admission: RE | Admit: 2019-05-16 | Discharge: 2019-05-16 | Disposition: A | Discharge status: Home / Self Care | Payer: Self-pay | Attending: Podiatry | Admitting: Podiatry

## 2019-05-16 DIAGNOSIS — F4310 Post-traumatic stress disorder, unspecified: Secondary | ICD-10-CM | POA: Insufficient documentation

## 2019-05-16 DIAGNOSIS — Z23 Encounter for immunization: Secondary | ICD-10-CM | POA: Insufficient documentation

## 2019-05-16 DIAGNOSIS — M2012 Hallux valgus (acquired), left foot: Secondary | ICD-10-CM | POA: Insufficient documentation

## 2019-05-16 DIAGNOSIS — Z791 Long term (current) use of non-steroidal anti-inflammatories (NSAID): Secondary | ICD-10-CM | POA: Insufficient documentation

## 2019-05-16 DIAGNOSIS — F329 Major depressive disorder, single episode, unspecified: Secondary | ICD-10-CM | POA: Insufficient documentation

## 2019-05-16 DIAGNOSIS — E785 Hyperlipidemia, unspecified: Secondary | ICD-10-CM | POA: Insufficient documentation

## 2019-05-16 DIAGNOSIS — Z79899 Other long term (current) drug therapy: Secondary | ICD-10-CM | POA: Insufficient documentation

## 2019-05-16 HISTORY — PX: BUNIONECTOMY: SHX129

## 2019-05-16 LAB — POCT PREGNANCY, URINE: Preg Test, Ur: NEGATIVE

## 2019-05-16 SURGERY — BUNIONECTOMY
Anesthesia: General | Site: Toe | Laterality: Right

## 2019-05-16 MED ORDER — OXYCODONE-ACETAMINOPHEN 5-325 MG PO TABS: 1.0000 | ORAL_TABLET | Freq: Four times a day (QID) | ORAL | 0 refills | Status: DC | PRN

## 2019-05-16 MED ORDER — PROPOFOL 500 MG/50ML IV EMUL
INTRAVENOUS | Status: DC | PRN
  Administered 2019-05-16: 100 ug/kg/min via INTRAVENOUS

## 2019-05-16 MED ORDER — PROPOFOL 10 MG/ML IV BOLUS
INTRAVENOUS | Status: DC | PRN
  Administered 2019-05-16: 20 mg via INTRAVENOUS
  Administered 2019-05-16: 40 mg via INTRAVENOUS

## 2019-05-16 MED ORDER — FENTANYL CITRATE (PF) 100 MCG/2ML IJ SOLN
INTRAMUSCULAR | Status: AC
  Filled 2019-05-16: qty 2

## 2019-05-16 MED ORDER — MIDAZOLAM HCL 2 MG/2ML IJ SOLN
INTRAMUSCULAR | Status: AC
  Filled 2019-05-16: qty 2

## 2019-05-16 MED ORDER — MIDAZOLAM HCL 2 MG/2ML IJ SOLN: 1.0000 mg | INTRAMUSCULAR | Status: DC | PRN

## 2019-05-16 MED ORDER — BUPIVACAINE HCL (PF) 0.5 % IJ SOLN
INTRAMUSCULAR | Status: AC
  Filled 2019-05-16: qty 30

## 2019-05-16 MED ORDER — MIDAZOLAM HCL 5 MG/5ML IJ SOLN
INTRAMUSCULAR | Status: DC | PRN
  Administered 2019-05-16: 2 mg via INTRAVENOUS

## 2019-05-16 MED ORDER — CHLORHEXIDINE GLUCONATE 4 % EX LIQD: 60.0000 mL | Freq: Once | CUTANEOUS | Status: DC

## 2019-05-16 MED ORDER — CEFAZOLIN SODIUM-DEXTROSE 2-4 GM/100ML-% IV SOLN
INTRAVENOUS | Status: AC
  Filled 2019-05-16: qty 100

## 2019-05-16 MED ORDER — CEFAZOLIN SODIUM-DEXTROSE 2-4 GM/100ML-% IV SOLN
2.0000 g | INTRAVENOUS | Status: AC
  Administered 2019-05-16: 15:00:00 2 g via INTRAVENOUS

## 2019-05-16 MED ORDER — BUPIVACAINE HCL (PF) 0.5 % IJ SOLN
INTRAMUSCULAR | Status: DC | PRN
  Administered 2019-05-16: 15 mL

## 2019-05-16 MED ORDER — ONDANSETRON HCL 4 MG/2ML IJ SOLN
INTRAMUSCULAR | Status: AC
  Filled 2019-05-16: qty 2

## 2019-05-16 MED ORDER — FENTANYL CITRATE (PF) 100 MCG/2ML IJ SOLN
INTRAMUSCULAR | Status: DC | PRN
  Administered 2019-05-16 (×2): 50 ug via INTRAVENOUS

## 2019-05-16 MED ORDER — LACTATED RINGERS IV SOLN: INTRAVENOUS | Status: DC

## 2019-05-16 MED ORDER — ONDANSETRON HCL 4 MG/2ML IJ SOLN
INTRAMUSCULAR | Status: DC | PRN
  Administered 2019-05-16: 4 mg via INTRAVENOUS

## 2019-05-16 MED ORDER — FENTANYL CITRATE (PF) 100 MCG/2ML IJ SOLN: 50.0000 ug | INTRAMUSCULAR | Status: DC | PRN

## 2019-05-16 MED ORDER — PRAVASTATIN SODIUM 20 MG PO TABS: 20.0000 mg | ORAL_TABLET | Freq: Every day | ORAL | 1 refills | Status: DC

## 2019-05-16 MED ORDER — PROPOFOL 500 MG/50ML IV EMUL
INTRAVENOUS | Status: AC
  Filled 2019-05-16: qty 50

## 2019-05-16 SURGICAL SUPPLY — 69 items
BIT DRILL CANN AO ASNIS 2.1 (DRILL) ×2 IMPLANT
BLADE AVERAGE 25MMX9MM (BLADE) ×1
BLADE AVERAGE 25X9 (BLADE) ×3 IMPLANT
BLADE OSC/SAG .038X5.5 CUT EDG (BLADE) IMPLANT
BLADE SURG 15 STRL LF DISP TIS (BLADE) ×8 IMPLANT
BLADE SURG 15 STRL SS (BLADE) ×8
BNDG ELASTIC 3X5.8 VLCR STR LF (GAUZE/BANDAGES/DRESSINGS) ×4 IMPLANT
BNDG ELASTIC 4X5.8 VLCR STR LF (GAUZE/BANDAGES/DRESSINGS) ×4 IMPLANT
BNDG ESMARK 4X9 LF (GAUZE/BANDAGES/DRESSINGS) ×4 IMPLANT
BNDG GAUZE ELAST 4 BULKY (GAUZE/BANDAGES/DRESSINGS) ×4 IMPLANT
BOOT STEPPER DURA MED (SOFTGOODS) ×4 IMPLANT
CHLORAPREP W/TINT 26 (MISCELLANEOUS) ×4 IMPLANT
CLOSURE WOUND 1/4X4 (GAUZE/BANDAGES/DRESSINGS) ×1
COUNTERSINK CANN 3 STRL (BIT) ×4
COVER BACK TABLE 60X90IN (DRAPES) ×4 IMPLANT
COVER WAND RF STERILE (DRAPES) IMPLANT
CUFF TOURN SGL QUICK 18X4 (TOURNIQUET CUFF) ×4 IMPLANT
CUFF TOURN SGL QUICK 34 (TOURNIQUET CUFF)
CUFF TRNQT CYL 34X4.125X (TOURNIQUET CUFF) IMPLANT
DRAPE EXTREMITY T 121X128X90 (DISPOSABLE) ×4 IMPLANT
DRAPE IMP U-DRAPE 54X76 (DRAPES) ×4 IMPLANT
DRAPE OEC MINIVIEW 54X84 (DRAPES) ×4 IMPLANT
DRAPE SURG 17X23 STRL (DRAPES) IMPLANT
DRAPE U-SHAPE 47X51 STRL (DRAPES) ×8 IMPLANT
DRILL CANN AO ASNIS 2.1 (DRILL) ×4
ELECT REM PT RETURN 9FT ADLT (ELECTROSURGICAL) ×4
ELECTRODE REM PT RTRN 9FT ADLT (ELECTROSURGICAL) ×2 IMPLANT
EXT HOSE W/PLC CONNECTION (MISCELLANEOUS) ×4
EXTENSION HOSE W/PLC CONNECTON (MISCELLANEOUS) ×2 IMPLANT
GAUZE 4X4 16PLY RFD (DISPOSABLE) IMPLANT
GAUZE SPONGE 4X4 12PLY STRL (GAUZE/BANDAGES/DRESSINGS) ×4 IMPLANT
GAUZE XEROFORM 1X8 LF (GAUZE/BANDAGES/DRESSINGS) ×4 IMPLANT
GLOVE BIO SURGEON STRL SZ7.5 (GLOVE) IMPLANT
GLOVE BIO SURGEON STRL SZ8 (GLOVE) ×4 IMPLANT
GLOVE BIOGEL M STRL SZ7.5 (GLOVE) ×4 IMPLANT
GLOVE BIOGEL PI IND STRL 8 (GLOVE) ×2 IMPLANT
GLOVE BIOGEL PI INDICATOR 8 (GLOVE) ×2
GOWN STRL REUS W/ TWL LRG LVL3 (GOWN DISPOSABLE) IMPLANT
GOWN STRL REUS W/ TWL XL LVL3 (GOWN DISPOSABLE) ×4 IMPLANT
GOWN STRL REUS W/TWL LRG LVL3 (GOWN DISPOSABLE)
GOWN STRL REUS W/TWL XL LVL3 (GOWN DISPOSABLE) ×4
K-WIRE .045X4 (WIRE) ×4 IMPLANT
K-WIRE 1.2 (WIRE) ×20
KWIRE 1.2 (WIRE) ×10 IMPLANT
NDL SAFETY ECLIPSE 18X1.5 (NEEDLE) IMPLANT
NEEDLE HYPO 18GX1.5 SHARP (NEEDLE)
NEEDLE HYPO 25X1 1.5 SAFETY (NEEDLE) ×8 IMPLANT
NS IRRIG 1000ML POUR BTL (IV SOLUTION) ×4 IMPLANT
PACK BASIN DAY SURGERY FS (CUSTOM PROCEDURE TRAY) ×4 IMPLANT
PADDING CAST ABS 4INX4YD NS (CAST SUPPLIES) ×4
PADDING CAST ABS COTTON 4X4 ST (CAST SUPPLIES) ×4 IMPLANT
PENCIL SMOKE EVACUATOR (MISCELLANEOUS) ×4 IMPLANT
SCREW CANN 3.0X17 (Screw) ×8 IMPLANT
SCREW COUNTERSINK CANN 3 STRL (BIT) ×2 IMPLANT
SPLINT FIBERGLASS 4X30 (CAST SUPPLIES) IMPLANT
STAPLER VISISTAT 35W (STAPLE) IMPLANT
STOCKINETTE 6  STRL (DRAPES) ×2
STOCKINETTE 6 STRL (DRAPES) ×2 IMPLANT
STRIP CLOSURE SKIN 1/4X4 (GAUZE/BANDAGES/DRESSINGS) ×3 IMPLANT
SUT MNCRL AB 3-0 PS2 18 (SUTURE) IMPLANT
SUT MNCRL AB 4-0 PS2 18 (SUTURE) ×4 IMPLANT
SUT MON AB 5-0 PS2 18 (SUTURE) IMPLANT
SUT PROLENE 4 0 PS 2 18 (SUTURE) IMPLANT
SUT VIC AB 3-0 PS2 18 (SUTURE) ×4 IMPLANT
SUT VIC AB 4-0 P2 18 (SUTURE) IMPLANT
SUT VICRYL 4-0 PS2 18IN ABS (SUTURE) ×4 IMPLANT
SYR BULB 3OZ (MISCELLANEOUS) ×4 IMPLANT
SYR CONTROL 10ML LL (SYRINGE) ×8 IMPLANT
UNDERPAD 30X36 HEAVY ABSORB (UNDERPADS AND DIAPERS) ×4 IMPLANT

## 2019-05-16 NOTE — Brief Op Note (Signed)
05/16/2019  4:01 PM  PATIENT:  Lori Morse  54 y.o. female  PRE-OPERATIVE DIAGNOSIS:  HALLUX ABDUCTOR VALGUS,TALUS BUNION  POST-OPERATIVE DIAGNOSIS:  HALLUX ABDUCTOR VALGUS,TALUS BUNION  PROCEDURE:  Procedure(s): [place] BUNIONECTOMY WITH METATARSAL OSTEOTOMY (Left)  SURGEON:  Surgeon(s) and Role:    [name], DPM - Primary  PHYSICIAN ASSISTANT:   ASSISTANTS: none   ANESTHESIA:   local and MAC  EBL:  2 mL   BLOOD ADMINISTERED:none  DRAINS: none   LOCAL MEDICATIONS USED:  MARCAINE     SPECIMEN:  No Specimen  DISPOSITION OF SPECIMEN:  N/A  COUNTS:  YES  TOURNIQUET:   Total Tourniquet Time Documented: Calf (Left) - 33 minutes Total: Calf (Left) - 33 minutes   DICTATION: .Viviann Spare Dictation  PLAN OF CARE: Discharge to home after PACU  PATIENT DISPOSITION:  PACU - hemodynamically stable.   Delay start of Pharmacological VTE agent (>24hrs) due to surgical blood loss or risk of bleeding: not applicable

## 2019-05-16 NOTE — Anesthesia Preprocedure Evaluation (Addendum)
Anesthesia Evaluation  Patient identified by MRN, date of birth, ID band Patient awake    Reviewed: Allergy & Precautions, NPO status , Patient's Chart, lab work & pertinent test results  Airway Mallampati: II  TM Distance: >3 FB Neck ROM: Full    Dental  (+) Dental Advisory Given, Teeth Intact, Caps,    Pulmonary neg pulmonary ROS,    Pulmonary exam normal breath sounds clear to auscultation       Cardiovascular negative cardio ROS Normal cardiovascular exam Rhythm:Regular Rate:Normal  ECG: SB, rate 55   Neuro/Psych PSYCHIATRIC DISORDERS Anxiety Depression PTSD (post-traumatic stress disorder)  Neuromuscular disease    GI/Hepatic negative GI ROS, Neg liver ROS,   Endo/Other  negative endocrine ROS  Renal/GU negative Renal ROS     Musculoskeletal negative musculoskeletal ROS (+)   Abdominal (+) + obese,   Peds  Hematology HLD   Anesthesia Other Findings RIGHT FOOT PLANTAR  RIGHT HALLUX ABDUCTOR VALGUS   Interpreter present- Pt voices understanding of MAC/GA anesthesia.  Reproductive/Obstetrics                             Anesthesia Physical  Anesthesia Plan  ASA: II  Anesthesia Plan: MAC   Post-op Pain Management:    Induction:   PONV Risk Score and Plan: 3 and Midazolam, Ondansetron, Treatment may vary due to age or medical condition and Dexamethasone  Airway Management Planned: Natural Airway, Nasal Cannula and Simple Face Mask  Additional Equipment: None  Intra-op Plan:   Post-operative Plan: Extubation in OR  Informed Consent: I have reviewed the patients History and Physical, chart, labs and discussed the procedure including the risks, benefits and alternatives for the proposed anesthesia with the patient or authorized representative who has indicated his/her understanding and acceptance.       Plan Discussed with: CRNA  Anesthesia Plan Comments:         Anesthesia Quick Evaluation

## 2019-05-16 NOTE — Discharge Instructions (Signed)

## 2019-05-16 NOTE — Anesthesia Postprocedure Evaluation (Signed)
Anesthesia Post Note  Patient: Lori Morse  Procedure(s) Performed: [name] WITH METATARSAL OSTEOTOMY (Left Foot)     Patient location during evaluation: PACU Anesthesia Type: MAC Level of consciousness: awake Pain management: pain level controlled Vital Signs Assessment: post-procedure vital signs reviewed and stable Respiratory status: spontaneous breathing Cardiovascular status: stable Postop Assessment: no apparent nausea or vomiting Anesthetic complications: no    Last Vitals:  Vitals:   05/16/19 1615 05/16/19 1628  BP: 117/67 119/69  Pulse: (!) 46 (!) 50  Resp: 10 12  Temp:    SpO2: 100% 100%    Last Pain:  Vitals:   05/16/19 1615  TempSrc:   PainSc: 0-No pain   Pain Goal: Patients Stated Pain Goal: 3 (05/16/19 1213)  LLE Motor Response: Purposeful movement, Responds to commands(wiggles toes) (05/16/19 1628) LLE Sensation: Decreased, Tingling (05/16/19 1628)            Huston Foley

## 2019-05-16 NOTE — Interval H&P Note (Signed)
History and Physical Interval Note:  05/16/2019 2:43 PM  Lori Morse  has presented today for surgery, with the diagnosis of [place].  The various methods of treatment have been discussed with the patient and family. After consideration of risks, benefits and other options for treatment, the patient has consented to  Procedure(s): [place] BUNIONECTOMY WITH METATARSAL OSTEOTOMY (Left) as a surgical intervention.  The patient's history has been reviewed, patient examined, no change in status, stable for surgery.  I have reviewed the patient's chart and labs.  Questions were answered to the patient's satisfaction.     [name]

## 2019-05-16 NOTE — Telephone Encounter (Signed)
[facility] to say that they do not fill the medication for pain at there pharmacy pt can have the medication sent to the [facility] at [address] in [place]

## 2019-05-16 NOTE — Transfer of Care (Signed)
Immediate Anesthesia Transfer of Care Note  Patient: Lori Morse  Procedure(s) Performed: Altamese [place] WITH METATARSAL OSTEOTOMY (Left Foot)  Patient Location: PACU  Anesthesia Type:MAC  Level of Consciousness: awake, alert  and oriented  Airway & Oxygen Therapy: Patient Spontanous Breathing and Patient connected to face mask oxygen  Post-op Assessment: Report given to RN and Post -op Vital signs reviewed and stable  Post vital signs: Reviewed and stable  Last Vitals:  Vitals Value Taken Time  BP 115/81   Temp    Pulse 50 05/16/19 1552  Resp 7 05/16/19 1552  SpO2 100 % 05/16/19 1552  Vitals shown include unvalidated device data.  Last Pain:  Vitals:   05/16/19 1213  TempSrc: Oral  PainSc: 0-No pain      Patients Stated Pain Goal: 3 (53/79/43 2761)  Complications: No apparent anesthesia complications

## 2019-05-17 ENCOUNTER — Encounter: Payer: Self-pay | Admitting: *Deleted

## 2019-05-17 ENCOUNTER — Other Ambulatory Visit: Payer: Self-pay | Admitting: Podiatry

## 2019-05-17 MED ORDER — OXYCODONE-ACETAMINOPHEN 5-325 MG PO TABS: 1.0000 | ORAL_TABLET | Freq: Four times a day (QID) | ORAL | 0 refills | Status: DC | PRN

## 2019-05-17 MED FILL — PRAVASTATIN SODIUM 20 MG TA: 20 | 30 days supply | Qty: 30 | Fill #0

## 2019-05-17 NOTE — Progress Notes (Signed)
PRN postop 

## 2019-05-19 NOTE — Op Note (Signed)
OPERATIVE REPORT Patient name: Lori Morse MRN: [id number] DOB: 1965/12/30  DOS:  05/16/2019   Preop Dx: Hallux abductovalgus left foot Postop Dx: same  Procedure:  1.  Bunionectomy with first metatarsal osteotomy left foot  Surgeon: [name] DPM  Anesthesia: General anesthesia Hemostasis: Ankle tourniquet inflated to a pressure of 265mmHg after esmarch exsanguination   EBL: Minimal mL Materials: Stryker 3.0 mm cannulated screws x2 Injectables: 10 mL of 0.5% Marcaine plain Pathology: None  Condition: The patient tolerated the procedure and anesthesia well. No complications noted or reported   Justification for procedure: The patient is a 54 y.o. female who presents today for surgical correction of symptomatic bunion to the left foot. All conservative modalities of been unsuccessful in providing any sort of satisfactory alleviation of symptoms with the patient. The patient was told benefits as well as possible side effects of the surgery. The patient consented for surgical correction. The patient consent form was reviewed. All patient questions were answered. No guarantees were expressed or implied. The patient and the surgeon boson the patient consent form with the witness present and placed in the patient's chart.   Procedure in Detail: The patient was brought to the operating room, placed in the operating table in the supine position at which time an aseptic scrub and drape were performed about the patient's respective lower extremity after anesthesia was induced as described above. Attention was then directed to the surgical area where procedure number one commenced.  Procedure #1: Bunionectomy with first metatarsal osteotomy left foot A 4 cm linear longitudinal skin incision was planned and made overlying the first MTPJ of the left foot.  Incision was carried down to the level of joint capsule with care taken to cut clamp ligate and retract away all small  neurovascular structures traversing the incision site.  Capsulotomy was performed and a McGlamry elevator was utilized to release the lateral capsule.  Sagittal saw utilized to resect away the hypertrophic medial eminence.  Chevron osteotomy was then created with the dorsal osteotomy exiting the dorsal cortex much more proximal than the plantar osteotomy in essence creating a long dorsal arm to facilitate better internal fixation and reduction of the intermetatarsal angle.  Osteotomy was stabilized using Stryker 3.0 millimeter screws x2.  Screws were inserted in the standard AO fashion.  The cortical step-off which had been created due to translocation of the distal fragment of the first metatarsal was then resected away using a sagittal saw.  Irrigation was then utilized and intraoperative x-ray fluoroscopy was utilized to visualize the reduction of the intermetatarsal angle and placement of the orthopedic screws which was satisfactory.  Primary closure of the joint capsule was performed using 3-0 Vicryl suture with a medial capsulorrhaphy.  4-0 Vicryl suture utilized to reapproximate subcutaneous tissue followed by 5-0 Monocryl suture to reapproximate superficial skin edges.  Dry sterile compressive dressings were then applied to all previously mentioned incision sites about the patient's lower extremity. The tourniquet which was used for hemostasis was deflated. All normal neurovascular responses including pink color and warmth returned all the digits of patient's lower extremity.  The patient was then transferred from the operating room to the recovery room having tolerated the procedure and anesthesia well. All vital signs are stable. After a brief stay in the recovery room the patient was discharged with adequate prescriptions for analgesia. Verbal as well as written instructions were provided for the patient regarding wound care. The patient is to keep the dressings clean dry  and intact until they are to  follow surgeon Dr. Daylene Katayama in the office upon discharge.   [name], DPM [facility]  Dr. [name], [address]                                        [place], [place] [address]                Office [phone]  Fax [phone]

## 2019-05-22 ENCOUNTER — Ambulatory Visit: Payer: No Typology Code available for payment source

## 2019-05-22 ENCOUNTER — Ambulatory Visit (INDEPENDENT_AMBULATORY_CARE_PROVIDER_SITE_OTHER): Payer: No Typology Code available for payment source | Admitting: Podiatry

## 2019-05-22 ENCOUNTER — Other Ambulatory Visit: Payer: Self-pay | Admitting: Podiatry

## 2019-05-22 ENCOUNTER — Other Ambulatory Visit: Payer: Self-pay

## 2019-05-22 ENCOUNTER — Telehealth: Payer: Self-pay | Admitting: *Deleted

## 2019-05-22 DIAGNOSIS — M2012 Hallux valgus (acquired), left foot: Secondary | ICD-10-CM

## 2019-05-22 DIAGNOSIS — M21611 Bunion of right foot: Secondary | ICD-10-CM

## 2019-05-22 DIAGNOSIS — M2011 Hallux valgus (acquired), right foot: Secondary | ICD-10-CM

## 2019-05-22 DIAGNOSIS — Z09 Encounter for follow-up examination after completed treatment for conditions other than malignant neoplasm: Secondary | ICD-10-CM

## 2019-05-22 DIAGNOSIS — M21612 Bunion of left foot: Secondary | ICD-10-CM

## 2019-05-22 MED ORDER — HYDROCODONE-ACETAMINOPHEN 5-325 MG PO TABS: 1.0000 | ORAL_TABLET | Freq: Four times a day (QID) | ORAL | 0 refills | Status: DC | PRN

## 2019-05-22 MED ORDER — DOXYCYCLINE HYCLATE 100 MG PO TABS: 100.0000 mg | ORAL_TABLET | Freq: Two times a day (BID) | ORAL | 0 refills | Status: DC

## 2019-05-22 MED FILL — ?DOXYCYCLINE HYCLATE 100MG: 100 | 10 days supply | Qty: 20 | Fill #0

## 2019-05-22 NOTE — Telephone Encounter (Signed)
[facility] states they do not fill narcotics.

## 2019-05-22 NOTE — Telephone Encounter (Signed)
I asked L. Ladona Mow - Occupational psychologist to inform pt the [facility] did not fill narcotics and that she would need to choose another pharmacy, she informed L. Ladona Mow she would use [facility] on [address] and to make a note that her dtr, Lysle Rubens could pick up the pain medication for her. Valeta Harms gave pt the [facility] [phone] to call to check when medication would be ready.

## 2019-05-24 ENCOUNTER — Telehealth (INDEPENDENT_AMBULATORY_CARE_PROVIDER_SITE_OTHER): Payer: Self-pay

## 2019-05-24 NOTE — Telephone Encounter (Signed)
Call placed to patient using pacific interpreter Marney Setting [phone]) patient is aware that all labs are normal except cholesterol is still elevated. Advised her to continue taking pravastatin 20 mg every night at bedtime. Patient verbalized understanding of results. Nat Christen, CMA

## 2019-05-24 NOTE — Telephone Encounter (Signed)
-----   Message from Kerin Perna, NP sent at 05/21/2019  9:04 PM EST ----- Reviewed labs normal except cholesterol Triglyceride continue pravastatin 20mg  at bedtime

## 2019-05-26 NOTE — Progress Notes (Signed)
   Subjective:  Patient presents today status post bunionectomy left. DOS: 05/16/2019. She states she is doing well. She has been taking the pain medication every four hours as directed but is now taking it every 6 hours because it is really strong for her. She is requesting something less potent. She states the pain is mild with the pain medication and fairly severe without it. She denies worsening factors. She has been using the CAM boot as directed as well. Patient is here for further evaluation and treatment.    Past Medical History:  Diagnosis Date  . Bilateral bunions   . Depression   . Hyperlipidemia   . Left sided sciatica 03/2017   resolved  . Plantar fasciitis, right   . PTSD (post-traumatic stress disorder)   . Pulmonary nodules 04/17/2018   Bilateral, noted on CXR      Objective/Physical Exam Neurovascular status intact.  Skin incisions appear to be well coapted with sutures and staples intact. No sign of infectious process noted. No dehiscence. No active bleeding noted. Moderate edema noted to the surgical extremity.  Radiographic Exam:  Orthopedic hardware and osteotomies sites appear to be stable with routine healing.  Assessment: 1. s/p bunionectomy left. DOS: 05/16/2019   Plan of Care:  1. Patient was evaluated. X-rays reviewed 2. Dressing changed.  3. Post op shoe dispensed. Discontinue using the CAM boot.  4. Prescription for Doxycycline 100 mg #20 provided to patient.  5. Prescription for Vicodin 5/325 mg provided to patient.  6. Return to clinic in one week.    [name], DPM [facility]  Dr. [name], [address]                                        [place], [place] [address]                Office [phone]  Fax [phone]

## 2019-05-29 ENCOUNTER — Encounter: Payer: No Typology Code available for payment source | Admitting: Podiatry

## 2019-06-03 ENCOUNTER — Ambulatory Visit (INDEPENDENT_AMBULATORY_CARE_PROVIDER_SITE_OTHER): Payer: No Typology Code available for payment source | Admitting: Podiatry

## 2019-06-03 ENCOUNTER — Other Ambulatory Visit: Payer: Self-pay

## 2019-06-03 ENCOUNTER — Encounter: Payer: Self-pay | Admitting: Podiatry

## 2019-06-03 DIAGNOSIS — M2011 Hallux valgus (acquired), right foot: Secondary | ICD-10-CM

## 2019-06-03 DIAGNOSIS — Z09 Encounter for follow-up examination after completed treatment for conditions other than malignant neoplasm: Secondary | ICD-10-CM

## 2019-06-03 DIAGNOSIS — M21611 Bunion of right foot: Secondary | ICD-10-CM

## 2019-06-06 NOTE — Progress Notes (Signed)
   Subjective:  Patient presents today status post bunionectomy left. DOS: 05/16/2019. She states she is doing well overall. She reports some pain but states the swelling is improving. She states the pain is worse at night but denies any specific modifying factors. She has been using the post op shoe and taking Vicodin as directed. Patient is here for further evaluation and treatment.   Past Medical History:  Diagnosis Date  . Bilateral bunions   . Depression   . Hyperlipidemia   . Left sided sciatica 03/2017   resolved  . Plantar fasciitis, right   . PTSD (post-traumatic stress disorder)   . Pulmonary nodules 04/17/2018   Bilateral, noted on CXR      Objective/Physical Exam Neurovascular status intact.  Skin incisions appear to be well coapted with sutures and staples intact. No sign of infectious process noted. No dehiscence. No active bleeding noted. Moderate edema noted to the surgical extremity.  Assessment: 1. s/p bunionectomy left. DOS: 05/16/2019   Plan of Care:  1. Patient was evaluated. 2. Sutures removed.  3. Compression anklet dispensed.  4. Continue using post op shoe for four weeks.  5. Return to clinic in 4 weeks for final follow up visit.    [name], DPM [facility]  Dr. [name], [address]                                        [place], [place] [address]                Office [phone]  Fax [phone]

## 2019-06-12 ENCOUNTER — Encounter: Payer: No Typology Code available for payment source | Admitting: Podiatry

## 2019-06-19 MED FILL — PRAVASTATIN SODIUM 20 MG TA: 20 | 30 days supply | Qty: 30 | Fill #1

## 2019-06-26 ENCOUNTER — Encounter: Payer: Self-pay | Admitting: Podiatry

## 2019-06-26 ENCOUNTER — Ambulatory Visit (INDEPENDENT_AMBULATORY_CARE_PROVIDER_SITE_OTHER): Payer: No Typology Code available for payment source | Admitting: Podiatry

## 2019-06-26 ENCOUNTER — Other Ambulatory Visit: Payer: Self-pay

## 2019-06-26 ENCOUNTER — Ambulatory Visit: Payer: No Typology Code available for payment source

## 2019-06-26 DIAGNOSIS — Z09 Encounter for follow-up examination after completed treatment for conditions other than malignant neoplasm: Secondary | ICD-10-CM

## 2019-06-26 DIAGNOSIS — M2012 Hallux valgus (acquired), left foot: Secondary | ICD-10-CM

## 2019-07-09 NOTE — Progress Notes (Signed)
   Subjective:  Patient presents today status post bunionectomy left. DOS: 05/16/2019. She states she is doing well. She reports some mild intermittent pain with some associated swelling. She states she is using the post op shoe and compression anklet as directed. She denies worsening factors at this time. Patient is here for further evaluation and treatment.   Past Medical History:  Diagnosis Date  . Bilateral bunions   . Depression   . Hyperlipidemia   . Left sided sciatica 03/2017   resolved  . Plantar fasciitis, right   . PTSD (post-traumatic stress disorder)   . Pulmonary nodules 04/17/2018   Bilateral, noted on CXR      Objective/Physical Exam Neurovascular status intact.  Skin incisions appear to be well coapted. No sign of infectious process noted. No dehiscence. No active bleeding noted. Moderate edema noted to the surgical extremity.  Radiographic Exam:  Orthopedic hardware and osteotomies sites appear to be stable with routine healing.  Assessment: 1. s/p bunionectomy left. DOS: 05/16/2019   Plan of Care:  1. Patient was evaluated. X-Rays reviewed.  2. May resume full activity with no restrictions.  3. Recommended good shoe gear.  4. Return to clinic as needed.     [name], DPM [facility]  Dr. [name], [place]                                        [place], [place] [address]                Office [phone]  Fax [phone]

## 2019-07-23 ENCOUNTER — Telehealth: Payer: Self-pay | Admitting: Primary Care

## 2019-07-23 NOTE — Telephone Encounter (Signed)
Patient has upcoming appointment with Financial Assistance and has questions for advisor. Please call at [phone].

## 2019-07-23 NOTE — Telephone Encounter (Signed)
Pt was explain what she need to bring to her financial appt

## 2019-08-06 ENCOUNTER — Ambulatory Visit: Payer: Self-pay

## 2019-08-06 ENCOUNTER — Other Ambulatory Visit: Payer: Self-pay

## 2019-08-15 ENCOUNTER — Ambulatory Visit (INDEPENDENT_AMBULATORY_CARE_PROVIDER_SITE_OTHER): Payer: Self-pay | Admitting: Primary Care

## 2019-08-15 ENCOUNTER — Other Ambulatory Visit: Payer: Self-pay

## 2019-08-15 ENCOUNTER — Encounter (INDEPENDENT_AMBULATORY_CARE_PROVIDER_SITE_OTHER): Payer: Self-pay | Admitting: Primary Care

## 2019-08-15 VITALS — BP 122/81 | HR 63 | Temp 97.2°F | Resp 16 | Wt 199.0 lb

## 2019-08-15 DIAGNOSIS — F329 Major depressive disorder, single episode, unspecified: Secondary | ICD-10-CM

## 2019-08-15 DIAGNOSIS — F32A Depression, unspecified: Secondary | ICD-10-CM

## 2019-08-15 DIAGNOSIS — M79671 Pain in right foot: Secondary | ICD-10-CM

## 2019-08-15 DIAGNOSIS — G8929 Other chronic pain: Secondary | ICD-10-CM

## 2019-08-15 DIAGNOSIS — M25562 Pain in left knee: Secondary | ICD-10-CM

## 2019-08-15 DIAGNOSIS — M7751 Other enthesopathy of right foot: Secondary | ICD-10-CM

## 2019-08-15 DIAGNOSIS — Z1239 Encounter for other screening for malignant neoplasm of breast: Secondary | ICD-10-CM

## 2019-08-15 DIAGNOSIS — M722 Plantar fascial fibromatosis: Secondary | ICD-10-CM

## 2019-08-15 DIAGNOSIS — M25561 Pain in right knee: Secondary | ICD-10-CM

## 2019-08-15 MED ORDER — IBUPROFEN 800 MG PO TABS: 800.0000 mg | ORAL_TABLET | Freq: Three times a day (TID) | ORAL | 1 refills | Status: DC | PRN

## 2019-08-15 MED ORDER — PRAVASTATIN SODIUM 20 MG PO TABS: 20.0000 mg | ORAL_TABLET | Freq: Every day | ORAL | 1 refills | Status: DC

## 2019-08-15 MED ORDER — POLYETHYLENE GLYCOL 3350 17 GM/SCOOP PO POWD: 17.0000 g | Freq: Two times a day (BID) | ORAL | 1 refills | Status: DC | PRN

## 2019-08-15 MED ORDER — DULOXETINE HCL 30 MG PO CPEP: 30.0000 mg | ORAL_CAPSULE | Freq: Every day | ORAL | 1 refills | Status: DC

## 2019-08-15 MED FILL — PRAVASTATIN SODIUM 20 MG TA: 20 | 30 days supply | Qty: 30 | Fill #0

## 2019-08-15 MED FILL — DULoxetine HCL 30 MG CPEP: 30 | 30 days supply | Qty: 30 | Fill #0

## 2019-08-15 MED FILL — ?IBUPROFEN 800MG TABLETS: 800 | 30 days supply | Qty: 90 | Fill #0

## 2019-08-15 MED FILL — POLYETHYLENE GLYCOL 3350 PO: 17 | 8 days supply | Qty: 238 | Fill #0

## 2019-08-15 NOTE — Progress Notes (Signed)
Bilateral knee pain.  Hurts more when climbing stairs Pain will be 7/10 Onset x 2 mos  Concerns with pain on right foot.

## 2019-08-15 NOTE — Progress Notes (Signed)
Established Patient Office Visit  Subjective:  Patient ID: Lori Morse, female    DOB: [birth year]  Age: 54 y.o. MRN: [id number]  CC: No chief complaint on file.   HPI Lori Morse is a 54 year old Hispanic female (  [place] (interuptors [place] [phone]) She is obese, alert and oriented , appropriately dressed. She presents to day for her right foot pain and bilateral knee pain -she works at a Architect site and as Optometrist. She is also having problems constipation issues. She endorses eating fruits and vegetables and drinks(3)  1/4 bottle of water a day.    Past Medical History:  Diagnosis Date  . Bilateral bunions   . Depression   . Hyperlipidemia   . Left sided sciatica 03/2017   resolved  . Plantar fasciitis, right   . PTSD (post-traumatic stress disorder)   . Pulmonary nodules 04/17/2018   Bilateral, noted on CXR    Past Surgical History:  Procedure Laterality Date  . BUNIONECTOMY Left 05/16/2019   Procedure: [place] WITH METATARSAL OSTEOTOMY;  Surgeon: [name], DPM;  Location: [facility];  Service: Podiatry;  Laterality: Left;  . HALLUX VALGUS AUSTIN Right 05/17/2018   Procedure: HALLUX VALGUS AUSTIN RIGHT FOOT;  Surgeon: [name], DPM;  Location: [facility];  Service: Podiatry;  Laterality: Right;  . PLANTAR FASCIA RELEASE Right 05/17/2018   Procedure: PLANTAR FASCIOTOMY RIGHT FOOT;  Surgeon: [name], DPM;  Location: [facility];  Service: Podiatry;  Laterality: Right;  . Steroid Injections     foot    History reviewed. No pertinent family history.  Social History   Socioeconomic History  . Marital status: Single    Spouse name: Not on file  . Number of children: Not on file  . Years of education: Not on file  . Highest education level: Not on file  Occupational History  . Not on file  Tobacco Use  . Smoking status: Never Smoker  . Smokeless  tobacco: Never Used  Substance and Sexual Activity  . Alcohol use: Never  . Drug use: Never  . Sexual activity: Yes    Birth control/protection: Post-menopausal  Other Topics Concern  . Not on file  Social History Narrative  . Not on file   Social Determinants of Health   Financial Resource Strain:   . Difficulty of Paying Living Expenses:   Food Insecurity:   . Worried About Charity fundraiser in the Last Year:   . Arboriculturist in the Last Year:   Transportation Needs:   . Film/video editor (Medical):   Marland Kitchen Lack of Transportation (Non-Medical):   Physical Activity:   . Days of Exercise per Week:   . Minutes of Exercise per Session:   Stress:   . Feeling of Stress :   Social Connections:   . Frequency of Communication with Friends and Family:   . Frequency of Social Gatherings with Friends and Family:   . Attends Religious Services:   . Active Member of Clubs or Organizations:   . Attends Archivist Meetings:   Marland Kitchen Marital Status:   Intimate Partner Violence:   . Fear of Current or Ex-Partner:   . Emotionally Abused:   Marland Kitchen Physically Abused:   . Sexually Abused:     Outpatient Medications Prior to Visit  Medication Sig Dispense Refill  . ibuprofen (ADVIL) 800 MG tablet Take 1 tablet (800  mg total) by mouth every 8 (eight) hours as needed. 90 tablet 1  . doxycycline (VIBRA-TABS) 100 MG tablet Take 1 tablet (100 mg total) by mouth 2 (two) times daily. (Patient not taking: Reported on 08/15/2019) 20 tablet 0  . HYDROcodone-acetaminophen (NORCO/VICODIN) 5-325 MG tablet Take 1 tablet by mouth every 6 (six) hours as needed for moderate pain. (Patient not taking: Reported on 08/15/2019) 30 tablet 0  . meloxicam (MOBIC) 15 MG tablet Take 1 tablet (15 mg total) by mouth daily. (Patient not taking: Reported on 08/15/2019) 30 tablet 0  . Multiple Vitamins-Minerals (MULTIVITAMIN ADULT, MINERALS, PO) Take by mouth.    . oxyCODONE-acetaminophen (PERCOCET) 5-325 MG tablet  Take 1 tablet by mouth every 6 (six) hours as needed for severe pain. (Patient not taking: Reported on 08/15/2019) 30 tablet 0  . DULoxetine (CYMBALTA) 30 MG capsule Take 1 capsule (30 mg total) by mouth daily. (Patient not taking: Reported on 05/22/2019) 30 capsule 1  . pravastatin (PRAVACHOL) 20 MG tablet Take 1 tablet (20 mg total) by mouth daily. (Patient not taking: Reported on 08/15/2019) 90 tablet 1   No facility-administered medications prior to visit.    No Known Allergies  ROS Review of Systems  Gastrointestinal: Positive for constipation.  Musculoskeletal:       Bilateral knee pain  Bone spur right foot  All other systems reviewed and are negative.     Objective:    Physical Exam  Constitutional: She is oriented to person, place, and time. She appears well-developed and well-nourished.  HENT:  Head: Normocephalic.  Cardiovascular: Normal rate and regular rhythm.  Pulmonary/Chest: Effort normal and breath sounds normal.  Abdominal: Soft. Bowel sounds are normal.  Musculoskeletal:        General: Normal range of motion.     Cervical back: Normal range of motion and neck supple.  Neurological: She is alert and oriented to person, place, and time.  Skin: Skin is warm and dry.  Psychiatric: She has a normal mood and affect. Her behavior is normal. Judgment and thought content normal.    BP 122/81 (BP Location: Left Arm, Patient Position: Sitting, Cuff Size: Small)   Pulse 63   Temp (!) 97.2 F (36.2 C)   Resp 16   Wt 199 lb (90.3 kg)   LMP 09/02/2018 (Exact Date)   SpO2 97%   BMI 35.25 kg/m  Wt Readings from Last 3 Encounters:  08/15/19 199 lb (90.3 kg)  05/16/19 187 lb 6.3 oz (85 kg)  05/14/19 189 lb 9.6 oz (86 kg)     Health Maintenance Due  Topic Date Due  . MAMMOGRAM  05/17/2019    There are no preventive care reminders to display for this patient.  No results found for: TSH Lab Results  Component Value Date   WBC 7.1 05/14/2019   HGB 15.0  05/14/2019   HCT 42.4 05/14/2019   MCV 89 05/14/2019   PLT 277 05/14/2019   Lab Results  Component Value Date   NA 138 05/14/2019   K 3.9 05/14/2019   CO2 27 05/14/2019   GLUCOSE 97 05/14/2019   BUN 18 05/14/2019   CREATININE 0.78 05/14/2019   BILITOT 0.4 05/14/2019   ALKPHOS 92 05/14/2019   AST 28 05/14/2019   ALT 28 05/14/2019   PROT 7.0 05/14/2019   ALBUMIN 4.3 05/14/2019   CALCIUM 9.4 05/14/2019   Lab Results  Component Value Date   CHOL 178 05/14/2019   Lab Results  Component Value Date  HDL 43 05/14/2019   Lab Results  Component Value Date   LDLCALC 95 05/14/2019   Lab Results  Component Value Date   TRIG 237 (H) 05/14/2019   Lab Results  Component Value Date   CHOLHDL 4.1 05/14/2019   No results found for: HGBA1C    Assessment & Plan:  Diagnoses and all orders for this visit:  Encounter for screening for malignant neoplasm of breast, unspecified screening modality -     MM Digital Diagnostic Bilat; Future  Bone spur of right foot    -     Ambulatory referral to Podiatry  Chronic pain of both knees Work on losing weight to help reduce joint pain. May alternate with heat and ice application for pain relief. May also alternate with acetaminophen and Ibuprofen as prescribed pain relief. Other alternatives include massage, acupuncture and water aerobics.  You must stay active and avoid a sedentary lifestyle.  Right foot pain -     ibuprofen (ADVIL) 800 MG tablet; Take 1 tablet (800 mg total) by mouth every 8 (eight) hours as needed.  Depression, unspecified depression type She denies harming self or other but she does have problems sleeping Can lead to changes in mood . Explained she can not take medication stop it has to be wean off a daily medication to help with mood, sleep and pain -     DULoxetine (CYMBALTA) 30 MG capsule; Take 1 capsule (30 mg total) by mouth daily.  Plantar fasciitis of right foot -     DULoxetine (CYMBALTA) 30 MG capsule;  Take 1 capsule (30 mg total) by mouth daily.  Other orders -     pravastatin (PRAVACHOL) 20 MG tablet; Take 1 tablet (20 mg total) by mouth daily.    Meds ordered this encounter  Medications  . ibuprofen (ADVIL) 800 MG tablet    Sig: Take 1 tablet (800 mg total) by mouth every 8 (eight) hours as needed.    Dispense:  90 tablet    Refill:  1  . pravastatin (PRAVACHOL) 20 MG tablet    Sig: Take 1 tablet (20 mg total) by mouth daily.    Dispense:  90 tablet    Refill:  1  . DULoxetine (CYMBALTA) 30 MG capsule    Sig: Take 1 capsule (30 mg total) by mouth daily.    Dispense:  90 capsule    Refill:  1    Follow-up: Return in about 6 months (around 02/14/2020) for fasting labs.    Kerin Perna, NP

## 2019-08-15 NOTE — Patient Instructions (Signed)
Plan de alimentacin para los niveles altos de triglicridos High Triglycerides Eating Plan Los triglicridos son un tipo de grasas que se encuentran en la sangre. Los niveles elevados de triglicridos pueden aumentar el riesgo de padecer enfermedades cardacas y accidente cerebrovascular. Si sus niveles de triglicridos son altos, elegir los alimentos adecuados lo ayudar a reducir los triglicridos y a mantener la salud del corazn. Trabaje junto con el mdico o con un especialista en alimentacin y nutricin (nutricionista) a fin de crear y seguir un plan de alimentacin que sea adecuado para usted. Consejos para seguir este plan [place] generales   Baje de peso, si tiene sobrepeso. En la mayora de las personas, bajar de 5a 10libras (2a 5kg) ayuda a reducir los niveles de triglicridos. Un plan para bajar de peso puede incluir lo siguiente. ? Treinta minutos de actividad fsica al menos 5das a la semana. ? Reducir la cantidad de caloras, azcar y grasas que consume.  Coma una gran variedad de frutas frescas, verduras y cereales integrales. Estos alimentos son ricos en fibra.  Coma alimentos que contengan grasas saludables, como pescados grasos, frutos secos, semillas y aceite de oliva.  Evite los alimentos con alto contenido de azcar agregada, sal (sodio) agregada, grasas saturadas y grasas trans.  Evite los carbohidratos refinados, con bajo contenido de fibra, como el pan blanco, las galletas, los fideos y el arroz blanco.  Evite los alimentos con aceites parcialmente hidrogenados (grasas trans), como alimentos fritos o margarina en barra.  Limite el consumo de alcohol a no ms de 1medida por da si es mujer y no est embarazada, y 2medidas por da si es hombre. Una medida equivale a 12oz (355ml) de cerveza, 5oz (148ml) de vino o 1oz (44ml) de bebidas alcohlicas de alta graduacin. Segn su estado de salud general, el mdico puede recomendarle que consuma menos  alcohol. Leer las etiquetas de los alimentos  Fjese la cantidad de grasas saturadas en las etiquetas de los alimentos. Elija alimentos sin grasas saturadas o con muy poca cantidad de estas grasas.  Fjese la cantidad de grasas trans en las etiquetas de los alimentos. Elija alimentos sin grasas trans.  Fjese la cantidad de colesterol en las etiquetas de los alimentos. Elija alimentos con bajo contenido de colesterol. Pregntele al nutricionista la cantidad de colesterol que debe consumir a diario.  Fjese la cantidad de sodio en las etiquetas de los alimentos. Elija alimentos con menos de 140miligramos (mg) por porcin. De compras  Compre productos lcteos cuya etiqueta diga que no tienen grasa (descremados) o que tienen bajo contenido de grasa (1%).  Evite comprar alimentos procesados o preenvasados. En general, estos tienen un alto contenido de azcar agregada, sodio y grasas. Coccin  Cuando cocine, elija grasas saludables, como aceite de oliva o aceite de canola.  Use mtodos de coccin con menor contenido de grasas, tales como hornear, asar, hervir o grillar.  Cuando sea posible, prepare sus propias salsas, aderezos y marinadas, en lugar de comprarlos. Las salsas, los aderezos y las marinadas comprados en las tiendas suelen tener alto contenido de sodio y azcar. Planificacin de las comidas  Consuma ms comida casera y menos de restaurante, de bufs y comida rpida.  Coma pescados grasos al menos dos veces por semana. Entre los ejemplos de pescados grasos, se incluyen salmn, trucha, caballa, atn y arenque.  Si come los huevos enteros, no coma ms de tres yemas por semana. Qu alimentos se recomiendan? Esta podra no ser una lista completa. Hable con el nutricionista sobre las   mejores opciones alimenticias para usted. Cereales Panes, galletas, cereales y pastas integrales. Avena sin azcar. Trigo burgol. Cebada. Quinua. Arroz integral. Tortillas de harina  integral. Verduras Verduras frescas o congeladas. Verduras enlatadas con bajo contenido de sodio. Frutas Frutas frescas, en conserva (en su jugo natural) o frutas congeladas. Carnes y otros alimentos ricos en protenas Pollo o pavo sin piel. Carne de pollo o de pavo molida. Cortes magros de cerdo, sin grasa. Pescados y mariscos, especialmente salmn, trucha y arenque. Claras de huevo. Porotos, guisantes o lentejas secos. Frutos secos o semillas sin sal. Frijoles enlatados sin sal. Mantequilla natural de man o almendra. Lcteos Productos lcteos descremados. Leche descremada o semidescremada (al 1%). Quesos con contenido reducido de grasa (2%) y bajos en sodio. Ricota con bajo contenido de grasa. Queso cottage con bajo contenido de grasa. Yogur natural con bajo contenido de grasa. Grasas y aceites Margarina untable que no contenga grasas trans. Mayonesa light o con contenido reducido de grasa. Aderezos para ensaladas light o con contenido reducido de grasa. Aguacate. Aceites de canola, crtamo, oliva, soja y girasol. Qu alimentos no se recomiendan? Esta podra no ser una lista completa. Hable con el nutricionista sobre las mejores opciones alimenticias para usted. Cereales Pan blanco. Pastas blancas (comunes). Arroz blanco. Pan de maz. Bagels. Pasteles. Galletas saladas que contengan grasas trans. Verduras Verduras con crema o fritas. Verduras en salsa de queso. Frutas Frutas pasas endulzadas. Frutas enlatadas con almbar. Jugo de frutas. Carnes y otros alimentos ricos en protenas Cortes de carne con grasa. Costillas. Alas de pollo. Tocino. Salchichas. Mortadela. Salame. Chinchulines. Panceta. Perros calientes. Salchicha de cerdo. Fiambres envasados. Lcteos Leche entera o con contenido reducido de grasa (2%). Mitad leche y mitad crema. Queso crema. Yogur entero o endulzado. Quesos con toda su grasa. Sustitutos de cremas no lcteas. Coberturas batidas. Quesos para untar o quesos  procesados. Requesn. Bebidas Alcohol. Bebidas endulzadas, como refrescos, limonadas, bebidas frutales o ponches. Grasas y aceites Mantequilla. Margarina en barra. Manteca de cerdo. Lardo. Mantequilla clarificada. Grasa de panceta. Aceites tropicales, como aceite de coco, de palmiste o de palma. Dulces y postres Jarabe de maz. Azcares. Miel. Melaza. Caramelos. Mermelada y jalea. Jarabe. Cereales endulzados. Galletitas. Tartas. Bizcochuelos. Rosquillas. Muffins. Helados. Condimentos Salsas, aderezos y marinadas con alto contenido de azcar, comprados en tiendas, como salsa barbacoa y ktchup. Resumen  Un nivel elevado de triglicridos puede aumentar el riesgo de padecer enfermedades cardacas y accidente cerebrovascular. Elegir los alimentos adecuados puede ayudar a bajar el nivel de triglicridos.  Coma gran cantidad de frutas frescas, verduras y cereales integrales. Elija productos lcteos semidescremados y carnes magras. Coma pescados grasos al menos dos veces por semana.  Evite los alimentos procesados y preenvasados con azcar agregado, sodio, grasas saturadas y grasas trans.  Si tiene sugerencias o preguntas sobre los tipos de alimentos que son convenientes para usted, hable con su mdico o con un nutricionista. Esta informacin no tiene como fin reemplazar el consejo del mdico. Asegrese de hacerle al mdico cualquier pregunta que tenga. Document Revised: 12/12/2016 Document Reviewed: 12/12/2016 Elsevier Patient Education  2020 [employer]  

## 2019-08-16 ENCOUNTER — Other Ambulatory Visit (INDEPENDENT_AMBULATORY_CARE_PROVIDER_SITE_OTHER): Payer: Self-pay | Admitting: Primary Care

## 2019-08-19 ENCOUNTER — Other Ambulatory Visit: Payer: Self-pay

## 2019-08-19 DIAGNOSIS — Z1231 Encounter for screening mammogram for malignant neoplasm of breast: Secondary | ICD-10-CM

## 2019-09-04 ENCOUNTER — Ambulatory Visit: Payer: No Typology Code available for payment source | Admitting: Podiatry

## 2019-09-12 ENCOUNTER — Ambulatory Visit: Payer: Self-pay

## 2019-09-20 ENCOUNTER — Ambulatory Visit: Payer: No Typology Code available for payment source | Admitting: Podiatry

## 2019-09-20 ENCOUNTER — Other Ambulatory Visit: Payer: Self-pay

## 2019-10-10 ENCOUNTER — Ambulatory Visit: Payer: No Typology Code available for payment source | Admitting: Podiatry

## 2019-11-01 ENCOUNTER — Ambulatory Visit: Payer: No Typology Code available for payment source | Admitting: Podiatry

## 2019-11-01 ENCOUNTER — Other Ambulatory Visit: Payer: Self-pay

## 2019-11-01 ENCOUNTER — Encounter: Payer: Self-pay | Admitting: Podiatry

## 2019-11-01 DIAGNOSIS — M21621 Bunionette of right foot: Secondary | ICD-10-CM

## 2019-11-01 DIAGNOSIS — M2041 Other hammer toe(s) (acquired), right foot: Secondary | ICD-10-CM

## 2019-11-01 NOTE — Progress Notes (Signed)
  Subjective:  Patient ID: Lori Morse, female    DOB: January 28, 1966,  MRN: [id number]  Chief Complaint  Patient presents with  . Hammer Toe    Hammertoes 2 through 4, painful when walking right.    . Bunions    Painful tailor's bunion fifth toe right    54 y.o. female presents with the above complaint. History confirmed with patient. she is primarily Spanish-speaking with [place] as a second language.  Translation is provided by a staff member [place] today.She has a history of bilateral bunionectomy performed by Dr. Amalia Hailey.  The most recent one was May 16, 2019 on the left foot.  She healed uneventfully and had no issues with either of the big toes.  She complains of lateral right foot pain mostly centered around the fifth toe as well as cramping and pain in her toes which curl up of the second third and fourth toe on the right foot as well.  She has tried padding and shoe gear changes and states that she desires surgical correction.  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  Objective:  Physical Exam: warm, good capillary refill, no trophic changes or ulcerative lesions, normal DP and PT pulses and normal sensory exam. Left Foot: Well-healed surgical incision over the first metatarsal phalangeal joint, no hallux valgus noted  Right Foot: bunionette deformity noted, semireducible hammertoes 2 through 4, with an adductovarus fifth hammertoe and overlying hyperkeratosis of the fifth PIPJ, well-healed bunion incision with no hallux valgus recurrence.  No images are attached to the encounter.  Radiographs: Reviewed her most recent weightbearing right foot x-rays.  There is well-healed bunionectomy with 2 screw fixation.  There is a tailor's bunion with lateral bowing distally of the fifth metatarsal.  Adductovarus contracture of the fifth toe.  Digital contractures 2 through 4. Assessment:   1. Tailor's bunionette, right   2. Hammertoe of right foot      Plan:    Patient was evaluated and treated and all questions answered.   Discussed the etiology and treatment including surgical and non surgical treatment for painful tailor's bunion and hammertoes.  She has exhausted all non surgical treatment prior to this visit including shoe gear changes and padding.  She desires surgical intervention. We discussed all risks including but not limited to: pain, swelling, infection, scar, numbness which may be temporary or permanent, chronic pain, stiffness, nerve pain or damage, wound healing problems, bone healing problems including delayed or non-union and recurrence. Informed consent was signed today. Surgery will be scheduled at a mutually agreeable date, is likely done at the [facility] or [place] [facility] center.  Her surgical plan will be tailored bunionectomy with a reverse chevron osteotomy with 2 screw fixation, arthroplasty of the fifth PIPJ, and hammertoe correction of the second through fourth digits with arthrodesis of the PIPJ, possible extensor tendon lengthenings,, possible capsulotomies of the MTPJ's.  Information we forwarded to the surgery scheduler who will call her with translation.  All questions and concerns were addressed today.  She will follow up after surgery Lanae Crumbly, DPM 11/01/2019

## 2019-11-18 ENCOUNTER — Other Ambulatory Visit: Payer: Self-pay

## 2019-11-18 ENCOUNTER — Encounter (HOSPITAL_BASED_OUTPATIENT_CLINIC_OR_DEPARTMENT_OTHER): Payer: Self-pay | Admitting: *Deleted

## 2019-11-19 ENCOUNTER — Other Ambulatory Visit (HOSPITAL_COMMUNITY)
Admission: RE | Admit: 2019-11-19 | Discharge: 2019-11-19 | Disposition: A | Discharge status: Home / Self Care | Payer: Self-pay | Source: Ambulatory Visit | Attending: Podiatry | Admitting: Podiatry

## 2019-11-19 DIAGNOSIS — Z20822 Contact with and (suspected) exposure to covid-19: Secondary | ICD-10-CM | POA: Insufficient documentation

## 2019-11-19 DIAGNOSIS — Z01812 Encounter for preprocedural laboratory examination: Secondary | ICD-10-CM | POA: Insufficient documentation

## 2019-11-19 LAB — SARS CORONAVIRUS 2 (TAT 6-24 HRS): SARS Coronavirus 2: NEGATIVE

## 2019-11-21 ENCOUNTER — Encounter (INDEPENDENT_AMBULATORY_CARE_PROVIDER_SITE_OTHER): Payer: Self-pay | Admitting: Primary Care

## 2019-11-21 ENCOUNTER — Other Ambulatory Visit: Payer: Self-pay

## 2019-11-21 ENCOUNTER — Ambulatory Visit (INDEPENDENT_AMBULATORY_CARE_PROVIDER_SITE_OTHER): Payer: Self-pay | Admitting: Primary Care

## 2019-11-21 VITALS — BP 107/65 | HR 53 | Temp 98.6°F | Ht 62.0 in | Wt 190.4 lb

## 2019-11-21 DIAGNOSIS — E785 Hyperlipidemia, unspecified: Secondary | ICD-10-CM

## 2019-11-21 DIAGNOSIS — Z008 Encounter for other general examination: Secondary | ICD-10-CM

## 2019-11-21 DIAGNOSIS — E669 Obesity, unspecified: Secondary | ICD-10-CM

## 2019-11-21 NOTE — Progress Notes (Addendum)
Established Patient Office Visit  Subjective:  Patient ID: Lori Morse, female    DOB: May 24, 1965  Age: 54 y.o. MRN: [id number]  CC:  Chief Complaint  Patient presents with  . Medical Clearance    Pt. is here for medical clearance for her right foot surgery.     HPI Lori Morse presents for medical clearance schedule for surgery tomorrow .HAMMER TOE REPAIR SECOND, THIRD, FOURTH AND FIFTH TOES RIGHT FOOT  Past Medical History:  Diagnosis Date  . Bilateral bunions   . Depression   . Hyperlipidemia   . Left sided sciatica 03/2017   resolved  . Plantar fasciitis, right   . PTSD (post-traumatic stress disorder)   . Pulmonary nodules 04/17/2018   Bilateral, noted on CXR    Past Surgical History:  Procedure Laterality Date  . BUNIONECTOMY Left 05/16/2019   Procedure: [place] WITH METATARSAL OSTEOTOMY;  Surgeon: [name], DPM;  Location: [place];  Service: Podiatry;  Laterality: Left;  . BUNIONECTOMY Right 11/22/2019   Procedure: Pauline Good;  Surgeon: Criselda Peaches, DPM;  Location: [place];  Service: Podiatry;  Laterality: Right;  IV SEDATION  . HALLUX VALGUS AUSTIN Right 05/17/2018   Procedure: HALLUX VALGUS AUSTIN RIGHT FOOT;  Surgeon: [name], DPM;  Location: [place];  Service: Podiatry;  Laterality: Right;  . HAMMER TOE SURGERY Right 11/22/2019   Procedure: HAMMER TOE REPAIR SECOND, THIRD,  FOURTH AND FIFTH TOES RIGHT FOOT;  Surgeon: Criselda Peaches, DPM;  Location: [place];  Service: Podiatry;  Laterality: Right;  IV SEDATION  . PLANTAR FASCIA RELEASE Right 05/17/2018   Procedure: PLANTAR FASCIOTOMY RIGHT FOOT;  Surgeon: [name], DPM;  Location: [place];  Service: Podiatry;  Laterality: Right;  . Steroid Injections     foot    History reviewed. No pertinent family history.  Social History   Socioeconomic History  .  Marital status: Single    Spouse name: Not on file  . Number of children: Not on file  . Years of education: Not on file  . Highest education level: Not on file  Occupational History  . Not on file  Tobacco Use  . Smoking status: Never Smoker  . Smokeless tobacco: Never Used  Vaping Use  . Vaping Use: Never used  Substance and Sexual Activity  . Alcohol use: Never  . Drug use: Never  . Sexual activity: Yes    Birth control/protection: Post-menopausal  Other Topics Concern  . Not on file  Social History Narrative  . Not on file   Social Determinants of Health   Financial Resource Strain:   . Difficulty of Paying Living Expenses:   Food Insecurity:   . Worried About Charity fundraiser in the Last Year:   . Arboriculturist in the Last Year:   Transportation Needs:   . Film/video editor (Medical):   Marland Kitchen Lack of Transportation (Non-Medical):   Physical Activity:   . Days of Exercise per Week:   . Minutes of Exercise per Session:   Stress:   . Feeling of Stress :   Social Connections:   . Frequency of Communication with Friends and Family:   . Frequency of Social Gatherings with Friends and Family:   . Attends Religious Services:   . Active Member of Clubs or Organizations:   . Attends Archivist Meetings:   Marland Kitchen Marital Status:  Intimate Partner Violence:   . Fear of Current or Ex-Partner:   . Emotionally Abused:   Marland Kitchen Physically Abused:   . Sexually Abused:     Outpatient Medications Prior to Visit  Medication Sig Dispense Refill  . DULoxetine (CYMBALTA) 30 MG capsule Take 1 capsule (30 mg total) by mouth daily. (Patient not taking: Reported on 11/24/2019) 90 capsule 1  . polyethylene glycol powder (GLYCOLAX/MIRALAX) 17 GM/SCOOP powder Take 17 g by mouth 2 (two) times daily as needed. (Patient not taking: Reported on 11/24/2019) 3350 g 1  . pravastatin (PRAVACHOL) 20 MG tablet Take 1 tablet (20 mg total) by mouth daily. (Patient not taking: Reported on  11/24/2019) 90 tablet 1  . ibuprofen (ADVIL) 800 MG tablet Take 1 tablet (800 mg total) by mouth every 8 (eight) hours as needed. 90 tablet 1  . Multiple Vitamins-Minerals (MULTIVITAMIN ADULT, MINERALS, PO) Take by mouth.    . oxyCODONE-acetaminophen (PERCOCET) 5-325 MG tablet Take 1 tablet by mouth every 6 (six) hours as needed for severe pain. (Patient not taking: Reported on 08/15/2019) 30 tablet 0   No facility-administered medications prior to visit.    No Known Allergies  ROS Review of Systems  All other systems reviewed and are negative.     Objective:    Physical Exam Vitals reviewed.  Constitutional:      Appearance: She is obese.  HENT:     Head: Normocephalic.     Right Ear: Tympanic membrane normal.     Left Ear: Tympanic membrane normal.     Nose: Nose normal.  Neck:     Comments: Adipose v/s goiter  Musculoskeletal:     Cervical back: Normal range of motion and neck supple.  Neurological:     Mental Status: She is alert.     BP 107/65 (BP Location: Left Arm, Patient Position: Sitting, Cuff Size: Normal)   Pulse 53   Temp 98.6 F (37 C) (Oral)   Ht '5\' 2"'  (1.575 m)   Wt 190 lb 6.4 oz (86.4 kg)   LMP 09/02/2018 (Exact Date)   SpO2 99%   BMI 34.82 kg/m  Wt Readings from Last 3 Encounters:  11/22/19 190 lb 14.7 oz (86.6 kg)  11/21/19 190 lb 6.4 oz (86.4 kg)  08/15/19 199 lb (90.3 kg)     Health Maintenance Due  Topic Date Due  . Hepatitis C Screening  Never done  . COVID-19 Vaccine (1) Never done  . MAMMOGRAM  05/17/2019  . INFLUENZA VACCINE  12/01/2019    There are no preventive care reminders to display for this patient.  Lab Results  Component Value Date   TSH 1.600 11/21/2019   Lab Results  Component Value Date   WBC 7.5 11/23/2019   HGB 14.0 11/23/2019   HCT 42.4 11/23/2019   MCV 92.8 11/23/2019   PLT 233 11/23/2019   Lab Results  Component Value Date   NA 138 11/23/2019   K 4.1 11/23/2019   CO2 29 11/23/2019   GLUCOSE 135  (H) 11/23/2019   BUN 10 11/23/2019   CREATININE 0.80 11/23/2019   BILITOT 0.6 11/24/2019   ALKPHOS 72 11/24/2019   AST 19 11/24/2019   ALT 23 11/24/2019   PROT 6.9 11/24/2019   ALBUMIN 3.5 11/24/2019   CALCIUM 9.2 11/23/2019   ANIONGAP 10 11/23/2019   Lab Results  Component Value Date   CHOL 146 11/21/2019   Lab Results  Component Value Date   HDL 43 11/21/2019   Lab Results  Component Value Date   LDLCALC 76 11/21/2019   Lab Results  Component Value Date   TRIG 155 (H) 11/21/2019   Lab Results  Component Value Date   CHOLHDL 3.4 11/21/2019   No results found for: HGBA1C    Assessment & Plan:  Rahmah was seen today for medical clearance.  Diagnoses and all orders for this visit:  Obesity (BMI 30.0-34.9) -     TSH + free T4  Hyperlipidemia, unspecified hyperlipidemia type -     Lipid Panel  Encounter for medical clearance for patient hold -     CBC with Differential -     CMP14+EGFR   No orders of the defined types were placed in this encounter.   Follow-up: No follow-ups on file.    Kerin Perna, NP

## 2019-11-22 ENCOUNTER — Other Ambulatory Visit (HOSPITAL_BASED_OUTPATIENT_CLINIC_OR_DEPARTMENT_OTHER): Payer: Self-pay | Admitting: Podiatry

## 2019-11-22 ENCOUNTER — Ambulatory Visit (HOSPITAL_BASED_OUTPATIENT_CLINIC_OR_DEPARTMENT_OTHER)
Admission: RE | Admit: 2019-11-22 | Discharge: 2019-11-22 | Disposition: A | Discharge status: Home / Self Care | Payer: Self-pay | Attending: Podiatry | Admitting: Podiatry

## 2019-11-22 ENCOUNTER — Encounter (HOSPITAL_BASED_OUTPATIENT_CLINIC_OR_DEPARTMENT_OTHER): Payer: Self-pay | Admitting: Podiatry

## 2019-11-22 ENCOUNTER — Ambulatory Visit (HOSPITAL_BASED_OUTPATIENT_CLINIC_OR_DEPARTMENT_OTHER): Payer: Self-pay | Admitting: Anesthesiology

## 2019-11-22 ENCOUNTER — Encounter (HOSPITAL_BASED_OUTPATIENT_CLINIC_OR_DEPARTMENT_OTHER)
Admission: RE | Disposition: A | Discharge status: Home / Self Care | Payer: Self-pay | Source: Home / Self Care | Attending: Podiatry

## 2019-11-22 ENCOUNTER — Other Ambulatory Visit (INDEPENDENT_AMBULATORY_CARE_PROVIDER_SITE_OTHER): Payer: Self-pay | Admitting: Primary Care

## 2019-11-22 DIAGNOSIS — F4310 Post-traumatic stress disorder, unspecified: Secondary | ICD-10-CM | POA: Insufficient documentation

## 2019-11-22 DIAGNOSIS — M205X1 Other deformities of toe(s) (acquired), right foot: Secondary | ICD-10-CM | POA: Insufficient documentation

## 2019-11-22 DIAGNOSIS — F329 Major depressive disorder, single episode, unspecified: Secondary | ICD-10-CM | POA: Insufficient documentation

## 2019-11-22 DIAGNOSIS — M2011 Hallux valgus (acquired), right foot: Secondary | ICD-10-CM

## 2019-11-22 DIAGNOSIS — M2041 Other hammer toe(s) (acquired), right foot: Secondary | ICD-10-CM | POA: Insufficient documentation

## 2019-11-22 DIAGNOSIS — M79671 Pain in right foot: Secondary | ICD-10-CM

## 2019-11-22 DIAGNOSIS — E785 Hyperlipidemia, unspecified: Secondary | ICD-10-CM | POA: Insufficient documentation

## 2019-11-22 DIAGNOSIS — M21621 Bunionette of right foot: Secondary | ICD-10-CM | POA: Insufficient documentation

## 2019-11-22 DIAGNOSIS — Z79899 Other long term (current) drug therapy: Secondary | ICD-10-CM | POA: Insufficient documentation

## 2019-11-22 HISTORY — PX: HAMMER TOE SURGERY: SHX385

## 2019-11-22 HISTORY — PX: BUNIONECTOMY: SHX129

## 2019-11-22 LAB — CBC WITH DIFFERENTIAL/PLATELET
Basophils Absolute: 0 10*3/uL (ref 0.0–0.2)
Basos: 1 %
EOS (ABSOLUTE): 0.1 10*3/uL (ref 0.0–0.4)
Eos: 3 %
Hematocrit: 42.8 % (ref 34.0–46.6)
Hemoglobin: 14.7 g/dL (ref 11.1–15.9)
Immature Grans (Abs): 0 10*3/uL (ref 0.0–0.1)
Immature Granulocytes: 0 %
Lymphocytes Absolute: 2.3 10*3/uL (ref 0.7–3.1)
Lymphs: 42 %
MCH: 31.5 pg (ref 26.6–33.0)
MCHC: 34.3 g/dL (ref 31.5–35.7)
MCV: 92 fL (ref 79–97)
Monocytes Absolute: 0.5 10*3/uL (ref 0.1–0.9)
Monocytes: 8 %
Neutrophils Absolute: 2.6 10*3/uL (ref 1.4–7.0)
Neutrophils: 46 %
Platelets: 240 10*3/uL (ref 150–450)
RBC: 4.66 x10E6/uL (ref 3.77–5.28)
RDW: 13.1 % (ref 11.7–15.4)
WBC: 5.6 10*3/uL (ref 3.4–10.8)

## 2019-11-22 LAB — CMP14+EGFR
ALT: 29 IU/L (ref 0–32)
AST: 25 IU/L (ref 0–40)
Albumin/Globulin Ratio: 1.3 (ref 1.2–2.2)
Albumin: 4.3 g/dL (ref 3.8–4.9)
Alkaline Phosphatase: 109 IU/L (ref 48–121)
BUN/Creatinine Ratio: 11 (ref 9–23)
BUN: 8 mg/dL (ref 6–24)
Bilirubin Total: 0.4 mg/dL (ref 0.0–1.2)
CO2: 28 mmol/L (ref 20–29)
Calcium: 9.8 mg/dL (ref 8.7–10.2)
Chloride: 100 mmol/L (ref 96–106)
Creatinine, Ser: 0.73 mg/dL (ref 0.57–1.00)
GFR calc Af Amer: 110 mL/min/{1.73_m2} (ref >59)
GFR calc non Af Amer: 95 mL/min/{1.73_m2} (ref >59)
Globulin, Total: 3.4 g/dL (ref 1.5–4.5)
Glucose: 83 mg/dL (ref 65–99)
Potassium: 4.4 mmol/L (ref 3.5–5.2)
Sodium: 140 mmol/L (ref 134–144)
Total Protein: 7.7 g/dL (ref 6.0–8.5)

## 2019-11-22 LAB — LIPID PANEL
Chol/HDL Ratio: 3.4 ratio (ref 0.0–4.4)
Cholesterol, Total: 146 mg/dL (ref 100–199)
HDL: 43 mg/dL (ref >39)
LDL Chol Calc (NIH): 76 mg/dL (ref 0–99)
Triglycerides: 155 mg/dL — ABNORMAL HIGH (ref 0–149)
VLDL Cholesterol Cal: 27 mg/dL (ref 5–40)

## 2019-11-22 LAB — TSH+FREE T4
Free T4: 1.24 ng/dL (ref 0.82–1.77)
TSH: 1.6 u[IU]/mL (ref 0.450–4.500)

## 2019-11-22 SURGERY — CORRECTION, HAMMER TOE
Anesthesia: Monitor Anesthesia Care | Site: Toe | Laterality: Right

## 2019-11-22 MED ORDER — CHLORHEXIDINE GLUCONATE CLOTH 2 % EX PADS: 6.0000 | MEDICATED_PAD | Freq: Once | CUTANEOUS | Status: DC

## 2019-11-22 MED ORDER — MIDAZOLAM HCL 2 MG/2ML IJ SOLN
INTRAMUSCULAR | Status: AC
  Filled 2019-11-22: qty 2

## 2019-11-22 MED ORDER — BUPIVACAINE HCL (PF) 0.5 % IJ SOLN
INTRAMUSCULAR | Status: DC | PRN
  Administered 2019-11-22: 15 mL

## 2019-11-22 MED ORDER — MIDAZOLAM HCL 5 MG/5ML IJ SOLN
INTRAMUSCULAR | Status: DC | PRN
  Administered 2019-11-22: 2 mg via INTRAVENOUS

## 2019-11-22 MED ORDER — FENTANYL CITRATE (PF) 100 MCG/2ML IJ SOLN
INTRAMUSCULAR | Status: AC
  Filled 2019-11-22: qty 2

## 2019-11-22 MED ORDER — GABAPENTIN 300 MG PO CAPS
300.0000 mg | ORAL_CAPSULE | Freq: Every day | ORAL | 0 refills | Status: DC
Start: 2019-11-22 — End: 2020-02-14

## 2019-11-22 MED ORDER — LIDOCAINE HCL 2 % IJ SOLN
INTRAMUSCULAR | Status: DC | PRN
  Administered 2019-11-22: 15 mL

## 2019-11-22 MED ORDER — LACTATED RINGERS IV SOLN: INTRAVENOUS | Status: DC

## 2019-11-22 MED ORDER — IBUPROFEN 800 MG PO TABS: 800.0000 mg | ORAL_TABLET | Freq: Three times a day (TID) | ORAL | 1 refills | Status: AC | PRN

## 2019-11-22 MED ORDER — PROPOFOL 500 MG/50ML IV EMUL
INTRAVENOUS | Status: AC
  Filled 2019-11-22: qty 50

## 2019-11-22 MED ORDER — PROPOFOL 10 MG/ML IV BOLUS
INTRAVENOUS | Status: AC
  Filled 2019-11-22: qty 20

## 2019-11-22 MED ORDER — CEFAZOLIN SODIUM-DEXTROSE 2-4 GM/100ML-% IV SOLN
INTRAVENOUS | Status: AC
  Filled 2019-11-22: qty 100

## 2019-11-22 MED ORDER — OXYCODONE-ACETAMINOPHEN 5-325 MG PO TABS: 1.0000 | ORAL_TABLET | Freq: Four times a day (QID) | ORAL | 0 refills | Status: DC | PRN

## 2019-11-22 MED ORDER — ONDANSETRON HCL 4 MG/2ML IJ SOLN
INTRAMUSCULAR | Status: DC | PRN
  Administered 2019-11-22: 4 mg via INTRAVENOUS

## 2019-11-22 MED ORDER — PROPOFOL 500 MG/50ML IV EMUL
INTRAVENOUS | Status: DC | PRN
  Administered 2019-11-22: 200 ug/kg/min via INTRAVENOUS

## 2019-11-22 MED ORDER — ONDANSETRON HCL 4 MG/2ML IJ SOLN
INTRAMUSCULAR | Status: AC
  Filled 2019-11-22: qty 2

## 2019-11-22 MED ORDER — CEFAZOLIN SODIUM-DEXTROSE 2-4 GM/100ML-% IV SOLN
2.0000 g | INTRAVENOUS | Status: AC
  Administered 2019-11-22: 2 g via INTRAVENOUS

## 2019-11-22 MED ORDER — EPHEDRINE SULFATE 50 MG/ML IJ SOLN
INTRAMUSCULAR | Status: DC | PRN
Start: 2019-11-22 — End: 2019-11-22
  Administered 2019-11-22: 10 mg via INTRAVENOUS

## 2019-11-22 MED ORDER — PROPOFOL 10 MG/ML IV BOLUS
INTRAVENOUS | Status: AC
  Filled 2019-11-22: qty 40

## 2019-11-22 MED ORDER — FENTANYL CITRATE (PF) 100 MCG/2ML IJ SOLN
INTRAMUSCULAR | Status: DC | PRN
  Administered 2019-11-22 (×2): 25 ug via INTRAVENOUS
  Administered 2019-11-22: 50 ug via INTRAVENOUS

## 2019-11-22 MED ORDER — OXYCODONE-ACETAMINOPHEN 5-325 MG PO TABS: 1.0000 | ORAL_TABLET | Freq: Four times a day (QID) | ORAL | 0 refills | Status: AC | PRN

## 2019-11-22 MED FILL — ?IBUPROFEN 800MG TABLETS: 800 | 5 days supply | Qty: 15 | Fill #0

## 2019-11-22 MED FILL — GABAPENTIN 300 MG CAPSULE: 300 | 5 days supply | Qty: 5 | Fill #0

## 2019-11-22 SURGICAL SUPPLY — 53 items
APL PRP STRL LF DISP 70% ISPRP (MISCELLANEOUS) ×1
BIT DRILL CANNULTD 1.7MM ASNIS (BIT) ×1 IMPLANT
BLADE AVERAGE 25MMX9MM (BLADE) ×1
BLADE AVERAGE 25X9 (BLADE) ×2 IMPLANT
BLADE OSC/SAG .038X5.5 CUT EDG (BLADE) ×3 IMPLANT
BLADE SURG 15 STRL LF DISP TIS (BLADE) ×2 IMPLANT
BLADE SURG 15 STRL SS (BLADE) ×6
BNDG CMPR 9X4 STRL LF SNTH (GAUZE/BANDAGES/DRESSINGS) ×1
BNDG ELASTIC 3X5.8 VLCR STR LF (GAUZE/BANDAGES/DRESSINGS) ×3 IMPLANT
BNDG ESMARK 4X9 LF (GAUZE/BANDAGES/DRESSINGS) ×3 IMPLANT
BNDG GAUZE ELAST 4 BULKY (GAUZE/BANDAGES/DRESSINGS) ×3 IMPLANT
CHLORAPREP W/TINT 26 (MISCELLANEOUS) ×3 IMPLANT
CLOSURE WOUND 1/4X4 (GAUZE/BANDAGES/DRESSINGS) ×1
COVER BACK TABLE 60X90IN (DRAPES) ×3 IMPLANT
COVER WAND RF STERILE (DRAPES) IMPLANT
CUFF TOURN SGL QUICK 18X4 (TOURNIQUET CUFF) ×3 IMPLANT
DRAPE EXTREMITY T 121X128X90 (DISPOSABLE) ×3 IMPLANT
DRAPE IMP U-DRAPE 54X76 (DRAPES) ×3 IMPLANT
DRAPE OEC MINIVIEW 54X84 (DRAPES) ×3 IMPLANT
DRILL CANNULATED 1.7MM ASNIS (BIT) ×3
DRSG EMULSION OIL 3X3 NADH (GAUZE/BANDAGES/DRESSINGS) ×3 IMPLANT
ELECT REM PT RETURN 9FT ADLT (ELECTROSURGICAL) ×3
ELECTRODE REM PT RTRN 9FT ADLT (ELECTROSURGICAL) ×1 IMPLANT
GAUZE 4X4 16PLY RFD (DISPOSABLE) IMPLANT
GAUZE SPONGE 4X4 12PLY STRL (GAUZE/BANDAGES/DRESSINGS) ×3 IMPLANT
GLOVE BIOGEL M 7.0 STRL (GLOVE) ×3 IMPLANT
GOWN STRL REUS W/ TWL LRG LVL3 (GOWN DISPOSABLE) ×2 IMPLANT
GOWN STRL REUS W/ TWL XL LVL3 (GOWN DISPOSABLE) IMPLANT
GOWN STRL REUS W/TWL LRG LVL3 (GOWN DISPOSABLE) ×6
GOWN STRL REUS W/TWL XL LVL3 (GOWN DISPOSABLE)
GUIDEWIRE ASNIS 2.0 100 NS (WIRE) ×3 IMPLANT
NDL SAFETY ECLIPSE 18X1.5 (NEEDLE) ×2 IMPLANT
NEEDLE HYPO 18GX1.5 SHARP (NEEDLE) ×6
NEEDLE HYPO 25X1 1.5 SAFETY (NEEDLE) ×6 IMPLANT
NS IRRIG 1000ML POUR BTL (IV SOLUTION) IMPLANT
PACK BASIN DAY SURGERY FS (CUSTOM PROCEDURE TRAY) ×3 IMPLANT
PADDING CAST ABS 4INX4YD NS (CAST SUPPLIES) ×2
PADDING CAST ABS COTTON 4X4 ST (CAST SUPPLIES) ×1 IMPLANT
PENCIL SMOKE EVACUATOR (MISCELLANEOUS) ×3 IMPLANT
SCREW CANN HEADED 2.0X10 (Screw) ×3 IMPLANT
SCREW CANN HEADED 2.0X13 (Screw) ×3 IMPLANT
SCREW CANN MIC PT 10X4X2X (Screw) ×1 IMPLANT
SCREW CANNULATED 2.0MMX16MM (Screw) ×3 IMPLANT
STOCKINETTE 6  STRL (DRAPES) ×2
STOCKINETTE 6 STRL (DRAPES) ×1 IMPLANT
STRIP CLOSURE SKIN 1/4X4 (GAUZE/BANDAGES/DRESSINGS) ×2 IMPLANT
SUT ETHILON 4 0 PS 2 18 (SUTURE) ×9 IMPLANT
SUT MNCRL AB 3-0 PS2 18 (SUTURE) ×3 IMPLANT
SUT MNCRL AB 4-0 PS2 18 (SUTURE) ×3 IMPLANT
SUT MON AB 5-0 PS2 18 (SUTURE) ×3 IMPLANT
SYR 10ML LL (SYRINGE) ×6 IMPLANT
SYR BULB EAR ULCER 3OZ GRN STR (SYRINGE) ×3 IMPLANT
UNDERPAD 30X36 HEAVY ABSORB (UNDERPADS AND DIAPERS) ×3 IMPLANT

## 2019-11-22 NOTE — Discharge Instructions (Addendum)
  Post Anesthesia Home Care Instructions  Activity: Get plenty of rest for the remainder of the day. A responsible individual must stay with you for 24 hours following the procedure.  For the next 24 hours, DO NOT: -Drive a car -Paediatric nurse -Drink alcoholic beverages -Take any medication unless instructed by your physician -Make any legal decisions or sign important papers.  Meals: Start with liquid foods such as gelatin or soup. Progress to regular foods as tolerated. Avoid greasy, spicy, heavy foods. If nausea and/or vomiting occur, drink only clear liquids until the nausea and/or vomiting subsides. Call your physician if vomiting continues.  Special Instructions/Symptoms: Your throat may feel dry or sore from the anesthesia or the breathing tube placed in your throat during surgery. If this causes discomfort, gargle with warm salt water. The discomfort should disappear within 24 hours.  If you had a scopolamine patch placed behind your ear for the management of post- operative nausea and/or vomiting:  1. The medication in the patch is effective for 72 hours, after which it should be removed.  Wrap patch in a tissue and discard in the trash. Wash hands thoroughly with soap and water. 2. You may remove the patch earlier than 72 hours if you experience unpleasant side effects which may include dry mouth, dizziness or visual disturbances. 3. Avoid touching the patch. Wash your hands with soap and water after contact with the patch.    Post-Surgery Instructions  1. If you are recuperating from surgery anywhere other than home, please be sure to leave [place] a number where you can be reached. 2. Go directly home and rest. 3. The keep operated foot (or feet) elevated six inches above the hip when sitting or lying down. 4. Support the elevated foot and leg with pillows under the calf. DO NOT PLACE PILLOWS UNDER THE KNEE. 5. DO NOT REMOVE or get your bandages wet. This will increase your  chances of getting an infection. 6. Wear your surgical shoe at all times when you are up. 7. A limited amount of pain and swelling may occur. The skin may take on a bruised appearance. This is no cause for alarm. 8. For slight pain and swelling, apply an ice pack directly over the bandage for 15 minutes every hour. Continue icing until seen in the office. DO NOT apply any form of heat to the area. 9. Have prescription(s) filled immediately and take as directed. 10. Drink lots of liquids, water, and juice. 11. CALL THE OFFICE IMMEDIATELY IF: a. Bleeding continues b. Pain increases and/or does not respond to medication c. Bandage or cast appears too tight d. Any liquids (water, coffee, etc.) have spilled on your bandages. e. Tripping, falling, or stubbing the surgical foot f. If your temperature rises above 101 g. If you have ANY questions at all  If you are weight-bearing, follow your physician's instructions. You are expected to be: ? weight-bearing ? non-weight bearing 13. Special Instructions: _____________________________________________________________ _________________________________________________________________________________ _________________________________________________________________________________  14. Your next appointment is: _7/30/21 at 2:15PM________________________________________  If you need to reach the nurse for any reason, please call: [place]: [phone] [place]: [phone] [place]: [phone]

## 2019-11-22 NOTE — Telephone Encounter (Signed)
Medication Refill - Medication: oxyCODONE-acetaminophen (PERCOCET) 5-325 MG tablet   Pt called requesting to have this medication rerouted to a different pharmacy  Has the patient contacted their pharmacy? Yes.   (Agent: If no, request that the patient contact the pharmacy for the refill.) (Agent: If yes, when and what did the pharmacy advise?)  Preferred Pharmacy (with phone number or street name):  [facility] [phone] - [place], [place] - [address]  [place] [address]  Phone: [phone] Fax: [phone]     Agent: Please be advised that RX refills may take up to 3 business days. We ask that you follow-up with your pharmacy.

## 2019-11-22 NOTE — Anesthesia Preprocedure Evaluation (Signed)
Anesthesia Evaluation  Patient identified by MRN, date of birth, ID band Patient awake    Reviewed: Allergy & Precautions, NPO status , Patient's Chart, lab work & pertinent test results  Airway Mallampati: II  TM Distance: >3 FB Neck ROM: Full    Dental no notable dental hx.    Pulmonary neg pulmonary ROS,    Pulmonary exam normal breath sounds clear to auscultation       Cardiovascular negative cardio ROS Normal cardiovascular exam Rhythm:Regular Rate:Normal     Neuro/Psych Depression negative neurological ROS     GI/Hepatic negative GI ROS, Neg liver ROS,   Endo/Other  negative endocrine ROS  Renal/GU negative Renal ROS  negative genitourinary   Musculoskeletal negative musculoskeletal ROS (+)   Abdominal   Peds negative pediatric ROS (+)  Hematology negative hematology ROS (+)   Anesthesia Other Findings   Reproductive/Obstetrics negative OB ROS                             Anesthesia Physical Anesthesia Plan  ASA: II  Anesthesia Plan: MAC   Post-op Pain Management:    Induction: Intravenous  PONV Risk Score and Plan: 2 and Ondansetron, Dexamethasone and Treatment may vary due to age or medical condition  Airway Management Planned: Simple Face Mask  Additional Equipment:   Intra-op Plan:   Post-operative Plan:   Informed Consent: I have reviewed the patients History and Physical, chart, labs and discussed the procedure including the risks, benefits and alternatives for the proposed anesthesia with the patient or authorized representative who has indicated his/her understanding and acceptance.     Dental advisory given  Plan Discussed with: CRNA and Surgeon  Anesthesia Plan Comments:         Anesthesia Quick Evaluation

## 2019-11-22 NOTE — Anesthesia Postprocedure Evaluation (Signed)
Anesthesia Post Note  Patient: Michaella Imai  Procedure(s) Performed: HAMMER TOE REPAIR SECOND, THIRD,  FOURTH AND FIFTH TOES RIGHT FOOT (Right Toe) TAYLOR'S BUNIONECTOMY (Right Toe)     Patient location during evaluation: PACU Anesthesia Type: MAC Level of consciousness: awake and alert Pain management: pain level controlled Vital Signs Assessment: post-procedure vital signs reviewed and stable Respiratory status: spontaneous breathing, nonlabored ventilation, respiratory function stable and patient connected to nasal cannula oxygen Cardiovascular status: stable and blood pressure returned to baseline Postop Assessment: no apparent nausea or vomiting Anesthetic complications: no   No complications documented.  Last Vitals:  Vitals:   11/22/19 1210 11/22/19 1220  BP:  113/66  Pulse: 49 50  Resp: 14 16  Temp:  36.6 C  SpO2: 100% 100%    Last Pain:  Vitals:   11/22/19 1220  TempSrc:   PainSc: 0-No pain                 Lilly Gasser S

## 2019-11-22 NOTE — Op Note (Signed)
Patient Name: Lori Morse DOB: Feb 16, 1966  MRN: [id number]   Date of Service: 11/22/2019  Surgeon: Dr. Lanae Crumbly, DPM Assistants: None Pre-operative Diagnosis:  #1 hammertoe digital contractures 2, 3, 4 #2 adductovarus contracture of fifth toe #3 tailor's bunion deformity Post-operative Diagnosis:  #1 hammertoe digital contractures 2, 3, 4 #2 adductovarus contracture of fifth toe #3 tailor's bunion deformity Procedures: #1 tailor's bunionectomy with distal metatarsal osteotomy and screw fixation #2 arthroplasty proximal interphalangeal joint fifth toe #3 arthrodesis proximal interphalangeal joint second, third, fourth toes Pathology/Specimens: None Anesthesia: IV sedation with local injectable anesthesia, 20 cc one-to-one mixture of 2% lidocaine plain and 0.5% Marcaine plain ankle block, additional 10 cc of one-to-one mixture was used intraoperatively Hemostasis:  Total Tourniquet Time Documented: Ankle (Right) - 114 minutes   Estimated Blood Loss: 15 mL Materials:  Implant Name Type Inv. Item Serial No. Manufacturer Lot No. LRB No. Used Action  2.0x10 headed screw Screw   [employer]  Right 1 Implanted   3-0, 4-0, 5-0 Monocryl; 4-0 nylon suture  0.045 inch Kirschner wires x3 implanted percutaneously  Complications: None  Indications for Procedure:  This is a 54 y.o. female with a history of painful tailor's bunion deformity, and digital toe contractures which cause pain and dorsal callus and hypertrophy over the proximal interphalangeal joints.  After exhausting nonsurgical treatment options, she elected for surgical treatment.   Procedure in Detail: Patient was identified in pre-operative holding area. Formal consent was signed and the right lower extremity was marked. Patient was brought back to the operating room. Anesthesia was induced. The extremity was prepped and draped in the usual sterile fashion. Timeout was taken to confirm patient name,  laterality, and procedure prior to incision.   Attention was then directed to the right foot where a linear incision was made over the fifth metatarsal mid diaphysis extending to the dorsal fifth toe.  The incision along the toe was fashioned in a semielliptical shape oriented from proximal lateral to distal medial.  A small ellipse of skin was excised in this region to correct the adductovarus rotation of the fifth toe.  The incision was then carried deep through subcutaneous tissues, all bleeding vessels were cauterized as necessary.  Care was taken to ensure that all vital neurovascular structures were retracted from the field safely.  Dissection continued down to the level of the fifth metatarsal phalangeal joint where the capsule was incised in a linear fashion and the periosteum was reflected along the distal fifth metatarsal to expose a large toes bony deformity with lateral bowing and bony hypertrophy along the lateral fifth metatarsal head.  A sagittal saw was used to remove this bony hypertrophy and exposed the fifth metatarsal head.  An axis guide was placed using a 0.045 inch Kirschner wire for a distal osteotomy to translate it medially and distally.  A chevron osteotomy was then created using a sagittal saw, the capital fragment was translated medially and distally and position was checked under fluoroscopy.  Once satisfactory position was achieved.  It was temporarily fixated with a guidewire for a 2.0 Stryker micro-Asnis screw.  The screw was then inserted according to the operative technique from the manufacturer's guidelines.  Good compression across the osteotomy was noted.  The overhang of bone on the lateral diaphysis was then resected using a sagittal saw and rasped smooth.  The wound was then thoroughly irrigated with normal sterile saline.  I then directed attention to the proximal inner phalangeal joint of the fifth digit,  dissection was carried deep to expose the fifth proximal phalanx  head.  A transverse extensor digitorum longus tenotomy was performed and the head of the phalanx was exposed.  The head was resected in an arthroplasty fashion and the edges were rasped smooth.  The wound was then thoroughly irrigated with normal sterile saline.  Attention was then directed to the second digit where a dorsal linear incision was created through the skin, dissection was carried deep through subcutaneous tissues.  Bleeders were cauterized as necessary.  All vital neurovascular structures were retracted safely.  Transverse tenotomy was performed through the extensor digitorum longus tendon and the proximal interphalangeal joint was exposed.  The articular surface of the head of the proximal phalanx and the base of the middle phalanx was then resected using a sagittal saw and rongeur.  A 0.045 inch Kirschner wire was then driven through the middle phalanx out the tip of the toe and then retrograded into the proximal phalanx to the level of the base.  It did not cross the metatarsal phalangeal joint and this was confirmed on fluoroscopy.  The wound was then irrigated.  Identical procedures were then performed on the third and fourth toes.  Closure was then initiated, with 3-0 Monocryl suture for periosteum, capsule, and repair of the extensor tenotomies.  Subcutaneous tissues were closed using 4-0 Monocryl in buried interrupted fashion.  5-0 Monocryl was then used to close the tailor's bunion and fifth toe incision in a running intradermal fashion, and 4-0 nylon was used to close the incisions in similar in rapid fashion over the second third and fourth toes.  The foot was then dressed with Adaptic, dry sterile gauze, Betadine soaked gauze splints to plantarflex the digits, Kerlix, and an Ace wrap.  Pin caps were placed over the cut ends of the Kirschner wires.  The patient tolerated the procedure well.  Patient was then moved from the operating table to the stretcher and then to the postanesthesia  recovery unit for further monitoring.   Lanae Crumbly, DPM 11/22/2019      Disposition: Following a period of post-operative monitoring, patient will be discharged to home.

## 2019-11-22 NOTE — H&P (Signed)
I have reviewed the history and physical completed 11/21/19 by Juluis Mire, NP, there are no changes and agree to the proposed procedure. Surgical site and side was confirmed.  Lanae Crumbly, DPM 11/22/2019

## 2019-11-22 NOTE — Transfer of Care (Signed)
Immediate Anesthesia Transfer of Care Note  Patient: Lori Morse  Procedure(s) Performed: HAMMER TOE REPAIR SECOND, THIRD,  FOURTH AND FIFTH TOES RIGHT FOOT (Right Toe) TAYLOR'S BUNIONECTOMY (Right Toe)  Patient Location: PACU  Anesthesia Type:MAC  Level of Consciousness: awake, alert  and oriented  Airway & Oxygen Therapy: Patient Spontanous Breathing and Patient connected to face mask oxygen  Post-op Assessment: Report given to RN and Post -op Vital signs reviewed and stable  Post vital signs: Reviewed and stable  Last Vitals:  Vitals Value Taken Time  BP    Temp    Pulse    Resp    SpO2      Last Pain:  Vitals:   11/22/19 0819  TempSrc: Oral  PainSc: 0-No pain         Complications: No complications documented.

## 2019-11-23 ENCOUNTER — Other Ambulatory Visit: Payer: Self-pay

## 2019-11-23 ENCOUNTER — Emergency Department (HOSPITAL_COMMUNITY)
Admission: EM | Admit: 2019-11-23 | Discharge: 2019-11-24 | Disposition: A | Discharge status: Home / Self Care | Payer: Self-pay | Attending: Emergency Medicine | Admitting: Emergency Medicine

## 2019-11-23 DIAGNOSIS — Z79899 Other long term (current) drug therapy: Secondary | ICD-10-CM | POA: Insufficient documentation

## 2019-11-23 DIAGNOSIS — R112 Nausea with vomiting, unspecified: Secondary | ICD-10-CM | POA: Insufficient documentation

## 2019-11-23 LAB — BASIC METABOLIC PANEL
Anion gap: 10 (ref 5–15)
BUN: 10 mg/dL (ref 6–20)
CO2: 29 mmol/L (ref 22–32)
Calcium: 9.2 mg/dL (ref 8.9–10.3)
Chloride: 99 mmol/L (ref 98–111)
Creatinine, Ser: 0.8 mg/dL (ref 0.44–1.00)
GFR calc Af Amer: >60 mL/min (ref >60)
GFR calc non Af Amer: >60 mL/min (ref >60)
Glucose, Bld: 135 mg/dL — ABNORMAL HIGH (ref 70–99)
Potassium: 4.1 mmol/L (ref 3.5–5.1)
Sodium: 138 mmol/L (ref 135–145)

## 2019-11-23 LAB — CBC
HCT: 42.4 % (ref 36.0–46.0)
Hemoglobin: 14 g/dL (ref 12.0–15.0)
MCH: 30.6 pg (ref 26.0–34.0)
MCHC: 33 g/dL (ref 30.0–36.0)
MCV: 92.8 fL (ref 80.0–100.0)
Platelets: 233 10*3/uL (ref 150–400)
RBC: 4.57 MIL/uL (ref 3.87–5.11)
RDW: 13.3 % (ref 11.5–15.5)
WBC: 7.5 10*3/uL (ref 4.0–10.5)
nRBC: 0 % (ref 0.0–0.2)

## 2019-11-23 NOTE — ED Triage Notes (Signed)
Pt presents ED POV. Pt c/o n/v since yesterday. Pt had surgery on R foot yesterday and has not been able to  Keep food of drink down. Emesis x4 today. Cannot keep pain meds down

## 2019-11-24 LAB — HEPATIC FUNCTION PANEL
ALT: 23 U/L (ref 0–44)
AST: 19 U/L (ref 15–41)
Albumin: 3.5 g/dL (ref 3.5–5.0)
Alkaline Phosphatase: 72 U/L (ref 38–126)
Bilirubin, Direct: 0.1 mg/dL (ref 0.0–0.2)
Indirect Bilirubin: 0.5 mg/dL (ref 0.3–0.9)
Total Bilirubin: 0.6 mg/dL (ref 0.3–1.2)
Total Protein: 6.9 g/dL (ref 6.5–8.1)

## 2019-11-24 LAB — URINALYSIS, ROUTINE W REFLEX MICROSCOPIC
Bilirubin Urine: NEGATIVE
Glucose, UA: NEGATIVE mg/dL
Hgb urine dipstick: NEGATIVE
Ketones, ur: NEGATIVE mg/dL
Nitrite: NEGATIVE
Protein, ur: NEGATIVE mg/dL
Specific Gravity, Urine: 1.005 (ref 1.005–1.030)
pH: 7 (ref 5.0–8.0)

## 2019-11-24 LAB — LIPASE, BLOOD: Lipase: 22 U/L (ref 11–51)

## 2019-11-24 MED ORDER — ONDANSETRON 4 MG PO TBDP
4.0000 mg | ORAL_TABLET | Freq: Three times a day (TID) | ORAL | 0 refills | Status: DC | PRN
Start: 2019-11-24 — End: 2020-02-14

## 2019-11-24 MED ORDER — LACTATED RINGERS IV BOLUS
1000.0000 mL | Freq: Once | INTRAVENOUS | Status: AC
  Administered 2019-11-24: 1000 mL via INTRAVENOUS

## 2019-11-24 MED ORDER — ONDANSETRON HCL 4 MG/2ML IJ SOLN
4.0000 mg | Freq: Once | INTRAMUSCULAR | Status: AC
  Administered 2019-11-24: 4 mg via INTRAVENOUS
  Filled 2019-11-24: qty 2

## 2019-11-24 MED ORDER — KETOROLAC TROMETHAMINE 30 MG/ML IJ SOLN
30.0000 mg | Freq: Once | INTRAMUSCULAR | Status: AC
  Administered 2019-11-24: 30 mg via INTRAVENOUS
  Filled 2019-11-24: qty 1

## 2019-11-24 NOTE — ED Notes (Signed)
Pt took home medications for pain per MD request.

## 2019-11-24 NOTE — ED Notes (Signed)
Pt finished meal tray.

## 2019-11-24 NOTE — ED Provider Notes (Signed)
[facility]GENC[facility] DEPART[facility] Provider Note   CSN: [id number] Arrival date & time: 11/23/19  1956     History Chief Complaint  Patient presents with  . Emesis    Lori Morse is a 54 y.o. female.  54yo F w/ PMH below who p/w nausea/vomiting. Pt had surgery on R foot 2 days ago and was given narcotic pain medicine to use postoperatively.  As soon as she took the medication, she began having nausea and has also had some vomiting.  She reports that the nausea and vomiting seems directly related to when she started taking the medication.  She is now having foot pain because she has not been able to tolerate medications.  She denies any fevers or diarrhea.  She does report some abdominal discomfort due to constipation since surgery.  She denies any sick contacts.  The history is provided by the patient. A language interpreter was used.  Emesis      Past Medical History:  Diagnosis Date  . Bilateral bunions   . Depression   . Hyperlipidemia   . Left sided sciatica 03/2017   resolved  . Plantar fasciitis, right   . PTSD (post-traumatic stress disorder)   . Pulmonary nodules 04/17/2018   Bilateral, noted on CXR    Patient Active Problem List   Diagnosis Date Noted  . Bilateral bunions 08/28/2018    Past Surgical History:  Procedure Laterality Date  . BUNIONECTOMY Left 05/16/2019   Procedure: [place] WITH METATARSAL OSTEOTOMY;  Surgeon: [name], DPM;  Location: [facility];  Service: Podiatry;  Laterality: Left;  . HALLUX VALGUS AUSTIN Right 05/17/2018   Procedure: HALLUX VALGUS AUSTIN RIGHT FOOT;  Surgeon: [name], DPM;  Location: [facility];  Service: Podiatry;  Laterality: Right;  . PLANTAR FASCIA RELEASE Right 05/17/2018   Procedure: PLANTAR FASCIOTOMY RIGHT FOOT;  Surgeon: [name], DPM;  Location: [facility];  Service: Podiatry;  Laterality: Right;  . Steroid  Injections     foot     OB History   No obstetric history on file.     No family history on file.  Social History   Tobacco Use  . Smoking status: Never Smoker  . Smokeless tobacco: Never Used  Vaping Use  . Vaping Use: Never used  Substance Use Topics  . Alcohol use: Never  . Drug use: Never    Home Medications Prior to Admission medications   Medication Sig Start Date End Date Taking? Authorizing Provider  gabapentin (NEURONTIN) 300 MG capsule Take 1 capsule (300 mg total) by mouth at bedtime for 5 days. 11/22/19 11/27/19 Yes McDonald, Stephan Minister, DPM  ibuprofen (ADVIL) 800 MG tablet Take 1 tablet (800 mg total) by mouth every 8 (eight) hours as needed for up to 7 days (pain). Patient taking differently: Take 800 mg by mouth every 8 (eight) hours as needed for moderate pain.  11/22/19 11/29/19 Yes McDonald, Stephan Minister, DPM  oxyCODONE-acetaminophen (PERCOCET) 5-325 MG tablet Take 1 tablet by mouth every 6 (six) hours as needed for up to 7 days for severe pain (pain). 11/22/19 11/29/19 Yes McDonald, Stephan Minister, DPM  DULoxetine (CYMBALTA) 30 MG capsule Take 1 capsule (30 mg total) by mouth daily. Patient not taking: Reported on 11/24/2019 08/15/19   Kerin Perna, NP  ondansetron (ZOFRAN ODT) 4 MG disintegrating tablet Take 1 tablet (4 mg total) by mouth every 8 (eight) hours as needed for nausea or  vomiting. 11/24/19   Allyn Bertoni, Wenda Overland, MD  polyethylene glycol powder (GLYCOLAX/MIRALAX) 17 GM/SCOOP powder Take 17 g by mouth 2 (two) times daily as needed. Patient not taking: Reported on 11/24/2019 08/15/19   Kerin Perna, NP  pravastatin (PRAVACHOL) 20 MG tablet Take 1 tablet (20 mg total) by mouth daily. Patient not taking: Reported on 11/24/2019 08/15/19   Kerin Perna, NP    Allergies    Patient has no known allergies.  Review of Systems   Review of Systems  Gastrointestinal: Positive for vomiting.   All other systems reviewed and are negative except that which was  mentioned in HPI  Physical Exam Updated Vital Signs BP 113/74 (BP Location: Right Arm)   Pulse 64   Temp 98.8 F (37.1 C) (Oral)   Resp 16   LMP 09/02/2018 (Exact Date)   SpO2 98%   Physical Exam Vitals and nursing note reviewed.  Constitutional:      General: She is not in acute distress.    Appearance: She is well-developed.  HENT:     Head: Normocephalic and atraumatic.     Mouth/Throat:     Mouth: Mucous membranes are moist.     Pharynx: Oropharynx is clear.  Eyes:     Conjunctiva/sclera: Conjunctivae normal.  Cardiovascular:     Rate and Rhythm: Normal rate and regular rhythm.     Heart sounds: Normal heart sounds. No murmur heard.   Pulmonary:     Effort: Pulmonary effort is normal.     Breath sounds: Normal breath sounds.  Abdominal:     General: Bowel sounds are normal. There is no distension.     Palpations: Abdomen is soft.     Tenderness: There is no abdominal tenderness.  Musculoskeletal:     Cervical back: Neck supple.     Comments: R foot in splint and walking shoe, toes warm and well perfused, pins in toes 2-4  Skin:    General: Skin is warm and dry.  Neurological:     Mental Status: She is alert and oriented to person, place, and time.     Comments: Fluent speech  Psychiatric:        Judgment: Judgment normal.     ED Results / Procedures / Treatments   Labs (all labs ordered are listed, but only abnormal results are displayed) Labs Reviewed  BASIC METABOLIC PANEL - Abnormal; Notable for the following components:      Result Value   Glucose, Bld 135 (*)    All other components within normal limits  URINALYSIS, ROUTINE W REFLEX MICROSCOPIC - Abnormal; Notable for the following components:   Leukocytes,Ua MODERATE (*)    Bacteria, UA RARE (*)    All other components within normal limits  URINE CULTURE  CBC  LIPASE, BLOOD  HEPATIC FUNCTION PANEL    EKG None  Radiology No results found.  Procedures Procedures (including critical care  time)  Medications Ordered in ED Medications  ondansetron (ZOFRAN) injection 4 mg (4 mg Intravenous Given 11/24/19 0824)  lactated ringers bolus 1,000 mL (0 mLs Intravenous Stopped 11/24/19 0952)  ketorolac (TORADOL) 30 MG/ML injection 30 mg (30 mg Intravenous Given 11/24/19 6578)    ED Course  I have reviewed the triage vital signs and the nursing notes.  Pertinent labs & imaging results that were available during my care of the patient were reviewed by me and considered in my medical decision making (see chart for details).    MDM Rules/Calculators/A&P  Abd non-tender on exam, VSS. Labs unremarkable. Gave fluids, zofran, and pain meds.  Her symptoms seem directly correlated with narcotic use.  Recommended taking narcotics with food and not on empty stomach and provided with Zofran to take along with medication.  Offered alternatives of taking Tylenol and Motrin on a schedule instead of narcotics.  Recommended Colace for constipation.  Reviewed return precautions. Final Clinical Impression(s) / ED Diagnoses Final diagnoses:  Non-intractable vomiting with nausea, unspecified vomiting type    Rx / DC Orders ED Discharge Orders         Ordered    ondansetron (ZOFRAN ODT) 4 MG disintegrating tablet  Every 8 hours PRN     Discontinue  Reprint     11/24/19 1201           Trenell Concannon, Wenda Overland, MD 11/24/19 1326

## 2019-11-24 NOTE — ED Notes (Signed)
AVS reviewed with pt. Pt wheeled out of dept

## 2019-11-24 NOTE — Discharge Instructions (Addendum)
Take pain medications with food. Take zofran as needed for nausea.

## 2019-11-24 NOTE — ED Notes (Signed)
ED Provider at bedside. 

## 2019-11-25 LAB — URINE CULTURE: Culture: <10000 — AB

## 2019-11-26 ENCOUNTER — Encounter (HOSPITAL_BASED_OUTPATIENT_CLINIC_OR_DEPARTMENT_OTHER): Payer: Self-pay | Admitting: Podiatry

## 2019-11-29 ENCOUNTER — Ambulatory Visit: Payer: No Typology Code available for payment source

## 2019-11-29 ENCOUNTER — Telehealth (INDEPENDENT_AMBULATORY_CARE_PROVIDER_SITE_OTHER): Payer: Self-pay

## 2019-11-29 ENCOUNTER — Ambulatory Visit (INDEPENDENT_AMBULATORY_CARE_PROVIDER_SITE_OTHER): Payer: No Typology Code available for payment source | Admitting: Podiatry

## 2019-11-29 ENCOUNTER — Other Ambulatory Visit: Payer: Self-pay

## 2019-11-29 DIAGNOSIS — M2011 Hallux valgus (acquired), right foot: Secondary | ICD-10-CM

## 2019-11-29 DIAGNOSIS — M2041 Other hammer toe(s) (acquired), right foot: Secondary | ICD-10-CM

## 2019-11-29 DIAGNOSIS — M21611 Bunion of right foot: Secondary | ICD-10-CM

## 2019-11-29 DIAGNOSIS — Z9889 Other specified postprocedural states: Secondary | ICD-10-CM

## 2019-11-29 NOTE — Telephone Encounter (Signed)
Call placed to patient using pacific interpreter [phone]) patient aware that Labs are normal hemoglobin hematocrit, electrolytes, liver and kidney functions  Triglycerides are elevated 155 decreased from 237 continue pravastatin 20 at bedtime. Thyroid function normal. She verbalized understanding. Nat Christen, CMA

## 2019-11-29 NOTE — Telephone Encounter (Signed)
-----   Message from Kerin Perna, NP sent at 11/26/2019  8:50 PM EDT ----- Labs are normal hemoglobin hematocrit, electrolytes, liver and kidney functions Triglycerides are elevated 155 decreased from 237 continue pravastatin 20 at bedtime. Thyroid function normal

## 2019-12-01 NOTE — Progress Notes (Signed)
  Subjective:  Patient ID: Lori Morse, female    DOB: 05/15/1965,  MRN: [id number]  Chief Complaint  Patient presents with  . Routine Post Op    DOS 11/22/2019 - R foot. Pain = 4/10. No fever/chills/N&V today. Pt reported that her dressing got "a little" wet in the shower yesterday. She went to the ER on 11/23/2019 d/t N&V - she could not take ondansetron or pain medication. She felt better after receiving IV antiemetics.     DOS: 11/22/2019 Procedure: Right foot tailor's bunionectomy, arthroplasty fifth toe, PIPJ arthrodesis 2, 3, 4  54 y.o. female returns for post-op check.  Doing well, nausea much improved after ER visit.  Review of Systems: Negative except as noted in the HPI. Denies N/V/F/Ch.   Objective:  There were no vitals filed for this visit. There is no height or weight on file to calculate BMI. Constitutional Well developed. Well nourished.  Vascular Foot warm and well perfused. Capillary refill normal to all digits.   Neurologic Normal speech. Oriented to person, place, and time. Epicritic sensation to light touch grossly present bilaterally.  Dermatologic Skin healing well without signs of infection. Skin edges well coapted without signs of infection.  Sutures intact.  Dressings were clean and dry  Orthopedic: Tenderness to palpation noted about the surgical site.  Pins intact and in good position, the pin caps have fallen off    Assessment:   1. Hallux valgus with bunions of right foot    Plan:  Patient was evaluated and treated and all questions answered.  S/p foot surgery right -Progressing as expected post-operatively. -XR: We will obtain at next visit -WB Status: WBAT in surgical shoe -Sutures: Intact, will remove next visit. -Medications: She has not needed much pain medication.  Continue OTC meds as needed -Foot redressed.  New pin caps applied  Lanae Crumbly, DPM 12/01/2019     Return in about 1 week (around 12/06/2019).

## 2019-12-04 MED FILL — ?PRAVASTATIN NA 20 MG TAB: 20 | 30 days supply | Qty: 30 | Fill #1

## 2019-12-04 MED FILL — DULoxetine HCL 30 MG CPEP: 30 | 30 days supply | Qty: 30 | Fill #1

## 2019-12-04 MED FILL — ONDANSETRON ODT 4 MG TABLET: 4 | 3 days supply | Qty: 10 | Fill #0

## 2019-12-04 MED FILL — IBUPROFEN 800 MG TABLET: 800 | 30 days supply | Qty: 90 | Fill #1

## 2019-12-04 MED FILL — POLYETHYLENE GLYCOL 3350 PO: 17 | 8 days supply | Qty: 238 | Fill #1

## 2019-12-06 ENCOUNTER — Ambulatory Visit (INDEPENDENT_AMBULATORY_CARE_PROVIDER_SITE_OTHER): Payer: No Typology Code available for payment source | Admitting: Podiatry

## 2019-12-06 ENCOUNTER — Other Ambulatory Visit: Payer: Self-pay

## 2019-12-06 DIAGNOSIS — Z9889 Other specified postprocedural states: Secondary | ICD-10-CM

## 2019-12-06 DIAGNOSIS — Z09 Encounter for follow-up examination after completed treatment for conditions other than malignant neoplasm: Secondary | ICD-10-CM

## 2019-12-06 DIAGNOSIS — M2041 Other hammer toe(s) (acquired), right foot: Secondary | ICD-10-CM

## 2019-12-06 DIAGNOSIS — M21621 Bunionette of right foot: Secondary | ICD-10-CM

## 2019-12-07 NOTE — Progress Notes (Signed)
  Subjective:  Patient ID: Lori Morse, female    DOB: [birth year],  MRN: [id number]  Chief Complaint  Patient presents with  . Routine Post Op    POV #2 DOS 11/22/19 HAMMERTOE REPAIR 2-5 RT, TAILORS BUNIONECTOMY RT. Pt stated, "Doing well. Pain = 2-3/10. Taking only ibuprofen - I didn't like taking oxycodone". Dressing was dry and intact. Pt was wearing her cam boot.    DOS: 11/22/2019 Procedure: Right foot tailor's bunionectomy, arthroplasty fifth toe, PIPJ arthrodesis 2, 3, 4  54 y.o. female returns for post-op check.  Doing well, no issues  Review of Systems: Negative except as noted in the HPI. Denies N/V/F/Ch.   Objective:  There were no vitals filed for this visit. There is no height or weight on file to calculate BMI. Constitutional Well developed. Well nourished.  Vascular Foot warm and well perfused. Capillary refill normal to all digits.   Neurologic Normal speech. Oriented to person, place, and time. Epicritic sensation to light touch grossly present bilaterally.  Dermatologic Skin healing well without signs of infection. Skin edges well coapted without signs of infection.  Sutures intact.  Dressings were clean and dry  Orthopedic: Tenderness to palpation noted about the surgical site.  Pins intact and in good position, the pin caps have fallen off    Assessment:   1. Hammertoe of right foot   2. Post-operative state   3. Tailor's bunionette, right   4. Surgery follow-up    Plan:  Patient was evaluated and treated and all questions answered.  S/p foot surgery right -Progressing as expected post-operatively. -XR: We will obtain at next visit to evaluate for pin removal -WB Status: WBAT in surgical shoe -Sutures:Removed every other suture on toes -Medications: She has not needed much pain medication.  Continue OTC meds as needed -Foot redressed.  -She may begin bathing, no soaking, no submersing in water. Apply small amount of antibiotic ointment after to  pin sites.  Lanae Crumbly, DPM      Return in about 2 weeks (around 12/20/2019) for post op visit, pin removal, need x-ray at next visit.

## 2019-12-20 ENCOUNTER — Ambulatory Visit (INDEPENDENT_AMBULATORY_CARE_PROVIDER_SITE_OTHER): Payer: No Typology Code available for payment source

## 2019-12-20 ENCOUNTER — Ambulatory Visit (INDEPENDENT_AMBULATORY_CARE_PROVIDER_SITE_OTHER): Payer: No Typology Code available for payment source | Admitting: Podiatry

## 2019-12-20 ENCOUNTER — Other Ambulatory Visit: Payer: Self-pay

## 2019-12-20 DIAGNOSIS — M2041 Other hammer toe(s) (acquired), right foot: Secondary | ICD-10-CM

## 2019-12-23 NOTE — Progress Notes (Signed)
  Subjective:  Patient ID: Lori Morse, female    DOB: 1965-12-08,  MRN: [id number]  Chief Complaint  Patient presents with  . Routine Post Op    POV#3  Pt states healing well. Denies any concerns and denies fever/nausea/vomiting/chills.    DOS: 11/22/2019 Procedure: Right foot tailor's bunionectomy, arthroplasty fifth toe, PIPJ arthrodesis 2, 3, 4  54 y.o. female returns for post-op check.  Doing well, no issues  Review of Systems: Negative except as noted in the HPI. Denies N/V/F/Ch.   Objective:  There were no vitals filed for this visit. There is no height or weight on file to calculate BMI. Constitutional Well developed. Well nourished.  Vascular Foot warm and well perfused. Capillary refill normal to all digits.   Neurologic Normal speech. Oriented to person, place, and time. Epicritic sensation to light touch grossly present bilaterally.  Dermatologic Skin healing well without signs of infection. Skin edges well coapted without signs of infection.  Sutures intact.  Dressings were clean and dry  Orthopedic:  No tenderness to palpation noted about the surgical site.  Pins intact and in good position, the pin caps have fallen off   Radiology: 3 weightbearing x-rays of the right foot.  Little consolidation at PIPJ arthrodesis sites, the tailor's bunionectomy in the lateral view appears to have shifted dorsally of the capital fragment, no clear fracture of the plantar wing, screw remains in place Assessment:   1. Hammertoe of right foot    Plan:  Patient was evaluated and treated and all questions answered.  S/p foot surgery right -Pins to remain on for 3 more weeks -The screw for the tailor's bunionectomy appears to have pulled through the dorsal cortex with slight displacement dorsally of the capital fragment.  I do not think it is just enough to warrant return to the operating room at this point.  I informed her of this and we discussed that it may take longer for  bony union. -WB Status: WBAT in surgical shoe -Sutures: All sutures are now removed -Medications: She has not needed much pain medication.  Continue OTC meds as needed -Foot redressed.  -She may begin bathing, no soaking, no submersing in water. Apply small amount of antibiotic ointment after to pin sites.  Lanae Crumbly, DPM      Return in about 3 weeks (around 01/10/2020) for pin removal, new x-ray.

## 2020-01-09 ENCOUNTER — Ambulatory Visit (INDEPENDENT_AMBULATORY_CARE_PROVIDER_SITE_OTHER): Payer: No Typology Code available for payment source | Admitting: Podiatry

## 2020-01-09 ENCOUNTER — Ambulatory Visit: Payer: No Typology Code available for payment source

## 2020-01-09 ENCOUNTER — Encounter: Payer: Self-pay | Admitting: Podiatry

## 2020-01-09 ENCOUNTER — Other Ambulatory Visit: Payer: Self-pay

## 2020-01-09 DIAGNOSIS — M2041 Other hammer toe(s) (acquired), right foot: Secondary | ICD-10-CM

## 2020-01-09 DIAGNOSIS — Z9889 Other specified postprocedural states: Secondary | ICD-10-CM

## 2020-01-09 MED FILL — DULoxetine HCL 30 MG CPEP: 30 | 30 days supply | Qty: 30 | Fill #2

## 2020-01-09 MED FILL — ?PRAVASTATIN NA 20 MG TAB: 20 | 30 days supply | Qty: 30 | Fill #2

## 2020-01-09 NOTE — Patient Instructions (Signed)
  30 Minute Rule:  Begin to transition to more weight in a regular shoe on the right foot with normal daily activities (e.g. going to work or running errands, walking around the house  If you have pain and swelling for more than 30 minutes following that activity, it's likely too much too soon and should decrease your distance/activity/weight/time the next time you do it  If you have some pain and swelling but doesn't last more than 30 minutes, that activity is OK and you can begin to increase your distance/activity/weight/time the next time you do it

## 2020-01-09 NOTE — Progress Notes (Signed)
  Subjective:  Patient ID: Lori Morse, female    DOB: October 28, 1965,  MRN: [id number]  Chief Complaint  Patient presents with  . Routine Post Op    POV #3 DOS 11/22/19 HAMMERTOE REPAIR 2-5 RT, TAILORS BUNIONECTOMY RT  "Its doing better"    DOS: 11/22/2019 Procedure: Right foot tailor's bunionectomy, arthroplasty fifth toe, PIPJ arthrodesis 2, 3, 4  54 y.o. female returns for post-op check.  Doing well, no issues  Review of Systems: Negative except as noted in the HPI. Denies N/V/F/Ch.   Objective:  There were no vitals filed for this visit. There is no height or weight on file to calculate BMI. Constitutional Well developed. Well nourished.  Vascular Foot warm and well perfused. Capillary refill normal to all digits.   Neurologic Normal speech. Oriented to person, place, and time. Epicritic sensation to light touch grossly present bilaterally.  Dermatologic  well-healed surgical incisions without signs of infection  Orthopedic:  No tenderness to palpation noted about the surgical site.  Pins intact and in good position.  Minimal edema.  No pain good range of motion of the MTPJ's   Radiology: 3 weightbearing x-rays of the right foot.  Increased consolidation and bony bridging at PIPJ arthrodesis sites, the tailor's bunionectomy capital fragment is stable and unchanged since previous radiographs, bony callus formation noted, screw unchanged in position Assessment:   1. Hammertoe of right foot   2. Post-operative state    Plan:  Patient was evaluated and treated and all questions answered.  S/p foot surgery right -Pins removed today uneventfully. -WB Status: WBAT, transition to regular shoe gear.  The 30-minute rule was reviewed with her. -Return in 6 weeks for final follow-up and radiographs  Lanae Crumbly, DPM      Return in about 6 weeks (around 02/20/2020).

## 2020-02-10 ENCOUNTER — Ambulatory Visit: Payer: Self-pay | Attending: Primary Care

## 2020-02-10 ENCOUNTER — Other Ambulatory Visit: Payer: Self-pay

## 2020-02-10 ENCOUNTER — Telehealth: Payer: Self-pay | Admitting: Primary Care

## 2020-02-10 MED FILL — DULoxetine HCL 30 MG CPEP: 30 | 30 days supply | Qty: 30 | Fill #3

## 2020-02-10 MED FILL — ?PRAVASTATIN NA 20 MG TAB: 20 | 30 days supply | Qty: 30 | Fill #3

## 2020-02-10 NOTE — Telephone Encounter (Signed)
Received an e-mail  That This account is with [facility], it is not eligible for cafa review as this time for CAFA, Pt was inform and will call them back with someone that speak [place]

## 2020-02-14 ENCOUNTER — Other Ambulatory Visit: Payer: Self-pay

## 2020-02-14 ENCOUNTER — Ambulatory Visit (INDEPENDENT_AMBULATORY_CARE_PROVIDER_SITE_OTHER): Payer: Self-pay | Admitting: Primary Care

## 2020-02-14 ENCOUNTER — Encounter (INDEPENDENT_AMBULATORY_CARE_PROVIDER_SITE_OTHER): Payer: Self-pay | Admitting: Primary Care

## 2020-02-14 VITALS — BP 115/77 | HR 65 | Temp 97.2°F | Ht 62.0 in | Wt 190.6 lb

## 2020-02-14 DIAGNOSIS — E785 Hyperlipidemia, unspecified: Secondary | ICD-10-CM

## 2020-02-14 DIAGNOSIS — E669 Obesity, unspecified: Secondary | ICD-10-CM

## 2020-02-14 DIAGNOSIS — M722 Plantar fascial fibromatosis: Secondary | ICD-10-CM

## 2020-02-14 DIAGNOSIS — Z760 Encounter for issue of repeat prescription: Secondary | ICD-10-CM

## 2020-02-14 DIAGNOSIS — F32A Depression, unspecified: Secondary | ICD-10-CM

## 2020-02-14 DIAGNOSIS — Z23 Encounter for immunization: Secondary | ICD-10-CM

## 2020-02-14 MED ORDER — PRAVASTATIN SODIUM 20 MG PO TABS: 20.0000 mg | ORAL_TABLET | Freq: Every day | ORAL | 1 refills | Status: DC

## 2020-02-14 MED ORDER — DULOXETINE HCL 30 MG PO CPEP: 30.0000 mg | ORAL_CAPSULE | Freq: Every day | ORAL | 1 refills | Status: DC

## 2020-02-14 NOTE — Patient Instructions (Signed)
Gripe en los adultos Influenza, Adult A la gripe tambin se la conoce como "influenza". Es una infeccin en los pulmones, la nariz y la garganta (vas respiratorias). La causa un virus. La gripe provoca sntomas que son similares a los de un resfro. Tambin causa fiebre alta y dolores corporales. Se transmite fcilmente de persona a persona (es contagiosa). La mejor manera de prevenir la gripe es aplicndose la vacuna contra la gripe todos los aos. Cules son las causas? La causa de esta afeccin es el virus de la influenza. Puede contraer el virus de las siguientes maneras:  Respirar las gotitas que estn en el aire y que provienen de la tos o el estornudo de una persona que tiene el virus.  Tocar algo que tiene el virus (est contaminado) y luego tocarse la boca, la nariz o los ojos. Qu incrementa el riesgo? Hay ciertas cosas que lo pueden hacer ms propenso a tener gripe. Estas incluyen lo siguiente:  No lavarse las manos con frecuencia.  Tener contacto cercano con muchas personas durante la temporada de resfro y gripe.  Tocarse la boca, los ojos o la nariz sin antes lavarse las manos.  No recibir la vacuna antigripal todos los aos. Puede correr un mayor riesgo de tener gripe, junto con problemas graves como una infeccin pulmonar (neumona), si:  Es mayor de 65 aos de edad.  Est embarazada.  Tiene debilitado el sistema que combate las defensas (sistema inmunitario) debido a una enfermedad o porque toma determinados medicamentos.  Tiene una enfermedad prolongada (crnica), por ejemplo: ? Enfermedad cardaca, renal o pulmonar. ? Diabetes. ? Asma.  Tiene un trastorno heptico.  Tiene mucho sobrepeso (obesidad mrbida).  Tiene anemia. Esta es una afeccin que afecta a los glbulos rojos. Cules son los signos o los sntomas? Los sntomas normalmente comienzan de repente y duran entre 4 y 14 das. Pueden incluir los siguientes:  Fiebre y escalofros.  Dolores de  cabeza, dolores en el cuerpo o dolores musculares.  Dolor de garganta.  Tos.  Secrecin o congestin nasal.  Malestar en el pecho.  No desear comer en las cantidades normales (prdida del apetito).  Debilidad o cansancio (fatiga).  Mareos.  Malestar estomacal (nuseas) o ganas de devolver (vmitos). Cmo se trata? Si la gripe se encuentra de forma temprana, se la puede tratar con medicamentos que pueden ayudar a reducir la gravedad de la enfermedad y reducir su duracin (medicamentos antivirales). Estos pueden administrarse por boca (va oral) o por va (catter) intravenosa. Cuidarse en su hogar puede ayudar a que mejoren los sntomas. El mdico puede sugerirle lo siguiente:  Tomar medicamentos de venta libre.  Beber mucho lquido. La gripe suele desaparecer sola. Si tiene sntomas muy graves u otros problemas, puede recibir tratamiento en un hospital. Siga estas indicaciones en su casa:     Actividad  Descanse todo lo que sea necesario. Duerma lo suficiente.  Qudese en su casa y no concurra al trabajo o a la escuela, como se lo haya indicado el mdico. ? No salga de su casa hasta que no haya tenido fiebre por 24horas sin tomar medicamentos. ? Salga de su casa solo para ir al mdico. Comida y bebida  Tome una SRO (solucin de rehidratacin oral). Es una bebida que se vende en farmacias y tiendas.  Beba suficiente lquido para mantener el pis (la orina) de color amarillo plido.  En la medida en que pueda, beba lquidos claros en pequeas cantidades. Los lquidos transparentes son, por ejemplo: ? Agua. ? Trocitos   de hielo. ? Jugo de frutas con agua agregada (jugo de frutas diluido). ? Bebidas deportivas de bajas caloras.  En la medida en que pueda, consuma alimentos blandos y fciles de digerir en pequeas cantidades. Estos alimentos incluyen: ? Bananas. ? Pur de manzana. ? Arroz. ? Carnes magras. ? Tostadas. ? Galletas.  No coma ni beba lo  siguiente: ? Lquidos con alto contenido de azcar o cafena. ? Alcohol. ? Alimentos condimentados o con alto contenido de grasa. Indicaciones generales  Tome los medicamentos de venta libre y los recetados solamente como se lo haya indicado el mdico.  Use un humidificador de aire fro para que el aire de su casa est ms hmedo. Esto puede facilitar la respiracin.  Al toser o estornudar, cbrase la boca y la nariz.  Lvese las manos con agua y jabn frecuentemente, en especial despus de toser o estornudar. Use desinfectante para manos con alcohol si no dispone de agua y jabn.  Concurra a todas las visitas de control como se lo haya indicado el mdico. Esto es importante. Cmo se evita?   Colquese la vacuna antigripal todos los aos. Puede colocarse la vacuna contra la gripe a fines de verano, en otoo o en invierno. Pregntele al mdico cundo debe aplicarse la vacuna contra la gripe.  Evite el contacto con personas que estn enfermas durante el otoo y el invierno (la temporada de resfro y gripe). Comunquese con un mdico si:  Tiene sntomas nuevos.  Tiene los siguientes sntomas: ? Dolor en el pecho. ? Materia fecal lquida (diarrea). ? Fiebre.  La tos empeora.  Empieza a tener ms mucosidad.  Tiene malestar estomacal.  Vomita. Solicite ayuda inmediatamente si:  Le falta el aire.  Tiene dificultad para respirar.  La piel o las uas se ponen de un color azulado.  Presenta dolor muy intenso o rigidez en el cuello.  Tiene dolor de cabeza repentino.  Le duele la cara o el odo de forma repentina.  No puede comer ni beber sin vomitar. Resumen  La gripe es una infeccin en los pulmones, la nariz y la garganta. La causa un virus.  Tome los medicamentos de venta libre y los recetados solamente como se lo haya indicado el mdico.  Aplicarse la vacuna contra la gripe todos los aos es la mejor manera de evitar contagiarse la gripe. Esta informacin no tiene  como fin reemplazar el consejo del mdico. Asegrese de hacerle al mdico cualquier pregunta que tenga. Document Revised: 11/29/2017 Document Reviewed: 11/29/2017 Elsevier Patient Education  2020 [employer]  

## 2020-02-14 NOTE — Progress Notes (Signed)
Established Patient Office Visit  Subjective:  Patient ID: Lori Morse, female    DOB: 10/04/1965  Age: 54 y.o. MRN: [id number]  CC:  Chief Complaint  Patient presents with  . Hyperlipidemia    HPI [name] is a 54 year old female who speaks Spanish only (interputor Izora Gala [phone]) presents for follow up on hyperlipidemia , however taken 2 months ago and not due for re check at this time. She would like a flu shoot.  Past Medical History:  Diagnosis Date  . Bilateral bunions   . Depression   . Hyperlipidemia   . Left sided sciatica 03/2017   resolved  . Plantar fasciitis, right   . PTSD (post-traumatic stress disorder)   . Pulmonary nodules 04/17/2018   Bilateral, noted on CXR    Past Surgical History:  Procedure Laterality Date  . BUNIONECTOMY Left 05/16/2019   Procedure: Altamese  WITH METATARSAL OSTEOTOMY;  Surgeon: [name], DPM;  Location: [place];  Service: Podiatry;  Laterality: Left;  . BUNIONECTOMY Right 11/22/2019   Procedure: Pauline Good;  Surgeon: Criselda Peaches, DPM;  Location: [place];  Service: Podiatry;  Laterality: Right;  IV SEDATION  . HALLUX VALGUS AUSTIN Right 05/17/2018   Procedure: HALLUX VALGUS AUSTIN RIGHT FOOT;  Surgeon: [name], DPM;  Location: [place];  Service: Podiatry;  Laterality: Right;  . HAMMER TOE SURGERY Right 11/22/2019   Procedure: HAMMER TOE REPAIR SECOND, THIRD,  FOURTH AND FIFTH TOES RIGHT FOOT;  Surgeon: Criselda Peaches, DPM;  Location: [place];  Service: Podiatry;  Laterality: Right;  IV SEDATION  . PLANTAR FASCIA RELEASE Right 05/17/2018   Procedure: PLANTAR FASCIOTOMY RIGHT FOOT;  Surgeon: [name], DPM;  Location: [place];  Service: Podiatry;  Laterality: Right;  . Steroid Injections     foot    History reviewed. No pertinent family history.  Social History   Socioeconomic  History  . Marital status: Single    Spouse name: Not on file  . Number of children: Not on file  . Years of education: Not on file  . Highest education level: Not on file  Occupational History  . Not on file  Tobacco Use  . Smoking status: Never Smoker  . Smokeless tobacco: Never Used  Vaping Use  . Vaping Use: Never used  Substance and Sexual Activity  . Alcohol use: Never  . Drug use: Never  . Sexual activity: Yes    Birth control/protection: Post-menopausal  Other Topics Concern  . Not on file  Social History Narrative  . Not on file   Social Determinants of Health   Financial Resource Strain:   . Difficulty of Paying Living Expenses: Not on file  Food Insecurity:   . Worried About Charity fundraiser in the Last Year: Not on file  . Ran Out of Food in the Last Year: Not on file  Transportation Needs:   . Lack of Transportation (Medical): Not on file  . Lack of Transportation (Non-Medical): Not on file  Physical Activity:   . Days of Exercise per Week: Not on file  . Minutes of Exercise per Session: Not on file  Stress:   . Feeling of Stress : Not on file  Social Connections:   . Frequency of Communication with Friends and Family: Not on file  . Frequency of Social Gatherings with Friends and Family: Not on file  .  Attends Religious Services: Not on file  . Active Member of Clubs or Organizations: Not on file  . Attends Archivist Meetings: Not on file  . Marital Status: Not on file  Intimate Partner Violence:   . Fear of Current or Ex-Partner: Not on file  . Emotionally Abused: Not on file  . Physically Abused: Not on file  . Sexually Abused: Not on file    Outpatient Medications Prior to Visit  Medication Sig Dispense Refill  . DULoxetine (CYMBALTA) 30 MG capsule Take 1 capsule (30 mg total) by mouth daily. 90 capsule 1  . pravastatin (PRAVACHOL) 20 MG tablet Take 1 tablet (20 mg total) by mouth daily. 90 tablet 1  . polyethylene glycol powder  (GLYCOLAX/MIRALAX) 17 GM/SCOOP powder Take 17 g by mouth 2 (two) times daily as needed. (Patient not taking: Reported on 02/14/2020) 3350 g 1  . gabapentin (NEURONTIN) 300 MG capsule Take 1 capsule (300 mg total) by mouth at bedtime for 5 days. 5 capsule 0  . ibuprofen (ADVIL) 800 MG tablet Take 800 mg by mouth every 8 (eight) hours as needed.    . ondansetron (ZOFRAN ODT) 4 MG disintegrating tablet Take 1 tablet (4 mg total) by mouth every 8 (eight) hours as needed for nausea or vomiting. 10 tablet 0   No facility-administered medications prior to visit.    No Known Allergies  ROS Review of Systems  All other systems reviewed and are negative.     Objective:    Physical Exam Vitals reviewed.  Constitutional:      Appearance: She is obese.  HENT:     Head: Normocephalic.     Right Ear: Tympanic membrane normal.     Left Ear: Tympanic membrane normal.     Nose: Nose normal.  Eyes:     Extraocular Movements: Extraocular movements intact.     Pupils: Pupils are equal, round, and reactive to light.  Cardiovascular:     Rate and Rhythm: Normal rate and regular rhythm.  Pulmonary:     Effort: Pulmonary effort is normal.     Breath sounds: Normal breath sounds.  Abdominal:     General: Bowel sounds are normal. There is distension.     Palpations: Abdomen is soft.  Musculoskeletal:        General: Normal range of motion.     Cervical back: Normal range of motion and neck supple.  Skin:    General: Skin is warm and dry.  Neurological:     Mental Status: She is alert and oriented to person, place, and time.  Psychiatric:        Mood and Affect: Mood normal.        Behavior: Behavior normal.        Thought Content: Thought content normal.        Judgment: Judgment normal.     BP 115/77 (BP Location: Right Arm, Patient Position: Sitting, Cuff Size: Normal)   Pulse 65   Temp (!) 97.2 F (36.2 C) (Temporal)   Ht 5\' 2"  (1.575 m)   Wt 190 lb 9.6 oz (86.5 kg)   LMP  09/02/2018 (Exact Date)   SpO2 96%   BMI 34.86 kg/m  Wt Readings from Last 3 Encounters:  02/14/20 190 lb 9.6 oz (86.5 kg)  11/22/19 190 lb 14.7 oz (86.6 kg)  11/21/19 190 lb 6.4 oz (86.4 kg)     Health Maintenance Due  Topic Date Due  . Hepatitis C Screening  Never done  .  MAMMOGRAM  05/17/2019  . PAP SMEAR-Modifier  04/13/2020    There are no preventive care reminders to display for this patient.  Lab Results  Component Value Date   TSH 1.600 11/21/2019   Lab Results  Component Value Date   WBC 7.5 11/23/2019   HGB 14.0 11/23/2019   HCT 42.4 11/23/2019   MCV 92.8 11/23/2019   PLT 233 11/23/2019   Lab Results  Component Value Date   NA 138 11/23/2019   K 4.1 11/23/2019   CO2 29 11/23/2019   GLUCOSE 135 (H) 11/23/2019   BUN 10 11/23/2019   CREATININE 0.80 11/23/2019   BILITOT 0.6 11/24/2019   ALKPHOS 72 11/24/2019   AST 19 11/24/2019   ALT 23 11/24/2019   PROT 6.9 11/24/2019   ALBUMIN 3.5 11/24/2019   CALCIUM 9.2 11/23/2019   ANIONGAP 10 11/23/2019   Lab Results  Component Value Date   CHOL 146 11/21/2019   Lab Results  Component Value Date   HDL 43 11/21/2019   Lab Results  Component Value Date   LDLCALC 76 11/21/2019   Lab Results  Component Value Date   TRIG 155 (H) 11/21/2019   Lab Results  Component Value Date   CHOLHDL 3.4 11/21/2019   No results found for: HGBA1C    Assessment & Plan:  Ardine was seen today for hyperlipidemia.  Diagnoses and all orders for this visit: Nakayla was seen today for hyperlipidemia.  Diagnoses and all orders for this visit:  Obesity (BMI 30.0-34.9) Obesity is 30-39 indicating an excess in caloric intake or underlining conditions.  Example hypertension, diabetes, and respiratory complications this may lead to other co-morbidities. Lifestyle modifications of diet and exercise may reduce obesity.   Hyperlipidemia, unspecified hyperlipidemia type Prescribed pravastatin 20 mg To decrease your fatty foods,  red meat, cheese, milk and increase fiber like whole grains and veggies.   Depression, unspecified depression type Managed by 30 mg of Cymbalta feels dosage is appropriate.  Feels a lot better.continue -     DULoxetine (CYMBALTA) 30 MG capsule; Take 1 capsule (30 mg total) by mouth daily.  Plantar fasciitis of right foot Also you can try freezing a water ball 16 ounces and roll it under the bottom of your foot for some pain relief -     DULoxetine (CYMBALTA) 30 MG capsule; Take 1 capsule (30 mg total) by mouth daily.  Medication refill -     pravastatin (PRAVACHOL) 20 MG tablet; Take 1 tablet (20 mg total) by mouth daily. -     DULoxetine (CYMBALTA) 30 MG capsule; Take 1 capsule (30 mg total) by mouth daily.  Need for immunization against influenza CDC recommends influenza vaccine yearly.   -     Flu Vaccine QUAD 36+ mos IM    Meds ordered this encounter  Medications  . pravastatin (PRAVACHOL) 20 MG tablet    Sig: Take 1 tablet (20 mg total) by mouth daily.    Dispense:  90 tablet    Refill:  1  . DULoxetine (CYMBALTA) 30 MG capsule    Sig: Take 1 capsule (30 mg total) by mouth daily.    Dispense:  90 capsule    Refill:  1    Follow-up: Return in about 2 months (around 04/15/2020) for Pap / fasting labs.    Kerin Perna, NP

## 2020-02-20 ENCOUNTER — Encounter: Payer: No Typology Code available for payment source | Admitting: Podiatry

## 2020-03-05 ENCOUNTER — Encounter: Payer: No Typology Code available for payment source | Admitting: Podiatry

## 2020-03-09 MED FILL — ?DULoxetine HCL 30MG CPEP: 30 | 30 days supply | Qty: 30 | Fill #4

## 2020-03-09 MED FILL — ?PRAVASTATIN NA 20MG TABL: 20 | 30 days supply | Qty: 30 | Fill #4

## 2020-03-20 ENCOUNTER — Other Ambulatory Visit: Payer: Self-pay

## 2020-03-20 ENCOUNTER — Ambulatory Visit (INDEPENDENT_AMBULATORY_CARE_PROVIDER_SITE_OTHER): Payer: No Typology Code available for payment source

## 2020-03-20 ENCOUNTER — Ambulatory Visit (INDEPENDENT_AMBULATORY_CARE_PROVIDER_SITE_OTHER): Payer: No Typology Code available for payment source | Admitting: Podiatry

## 2020-03-20 DIAGNOSIS — Z09 Encounter for follow-up examination after completed treatment for conditions other than malignant neoplasm: Secondary | ICD-10-CM

## 2020-03-20 DIAGNOSIS — M21621 Bunionette of right foot: Secondary | ICD-10-CM

## 2020-03-20 DIAGNOSIS — M2041 Other hammer toe(s) (acquired), right foot: Secondary | ICD-10-CM

## 2020-03-23 ENCOUNTER — Encounter: Payer: Self-pay | Admitting: Podiatry

## 2020-03-23 NOTE — Progress Notes (Signed)
  Subjective:  Patient ID: Lori Morse, female    DOB: July 18, 1965,  MRN: [id number]  Chief Complaint  Patient presents with  . Routine Post Op     POV #4 DOS 11/22/19 HAMMERTOE REPAIR 2-5 RT, TAILORS BUNIONECTOMY RT    DOS: 11/22/2019 Procedure: Right foot tailor's bunionectomy, arthroplasty fifth toe, PIPJ arthrodesis 2, 3, 4  54 y.o. female returns for post-op check.  Doing well, slight soreness in the toes.  Here with a translator today.  Review of Systems: Negative except as noted in the HPI. Denies N/V/F/Ch.   Objective:  There were no vitals filed for this visit. There is no height or weight on file to calculate BMI. Constitutional Well developed. Well nourished.  Vascular Foot warm and well perfused. Capillary refill normal to all digits.   Neurologic Normal speech. Oriented to person, place, and time. Epicritic sensation to light touch grossly present bilaterally.  Dermatologic  well-healed surgical incisions without signs of infection  Orthopedic:  No tenderness to palpation noted about the surgical site.   Minimal edema.  No pain good range of motion of the MTPJ's   Radiology: 3 weightbearing x-rays of the right foot.  There is persistent evidence of the arthrodesis site of the PIPJ's.  The fifth metatarsal has healed well. Assessment:   1. Hammertoe of right foot   2. Tailor's bunionette, right   3. Surgery follow-up    Plan:  Patient was evaluated and treated and all questions answered.  S/p foot surgery right -Continue full activity and regular shoe gear -Final follow-up x-rays at next visit -Discussed with her that she may develop a pseudoarthrosis of the PIPJ's.  If this is not painful and there is nothing to do.  Hopefully this will fill in with more bone over time  Lanae Crumbly, DPM      Return in about 6 months (around 09/17/2020), or if symptoms worsen or fail to improve.

## 2020-04-10 MED FILL — ?PRAVASTATIN NA 20MG TABL: 20 | 30 days supply | Qty: 30 | Fill #5

## 2020-04-10 MED FILL — ?DULoxetine HCL 30MG CPEP: 30 | 30 days supply | Qty: 30 | Fill #5

## 2020-04-15 ENCOUNTER — Other Ambulatory Visit: Payer: Self-pay

## 2020-04-15 ENCOUNTER — Ambulatory Visit (INDEPENDENT_AMBULATORY_CARE_PROVIDER_SITE_OTHER): Payer: Self-pay | Admitting: Primary Care

## 2020-04-15 ENCOUNTER — Encounter (INDEPENDENT_AMBULATORY_CARE_PROVIDER_SITE_OTHER): Payer: Self-pay | Admitting: Primary Care

## 2020-04-15 ENCOUNTER — Other Ambulatory Visit (HOSPITAL_COMMUNITY)
Admission: RE | Admit: 2020-04-15 | Discharge: 2020-04-15 | Disposition: A | Discharge status: Home / Self Care | Payer: Self-pay | Source: Ambulatory Visit | Attending: Primary Care | Admitting: Primary Care

## 2020-04-15 VITALS — BP 121/79 | HR 69 | Temp 97.3°F | Wt 189.4 lb

## 2020-04-15 DIAGNOSIS — E785 Hyperlipidemia, unspecified: Secondary | ICD-10-CM

## 2020-04-15 DIAGNOSIS — Z124 Encounter for screening for malignant neoplasm of cervix: Secondary | ICD-10-CM | POA: Insufficient documentation

## 2020-04-15 DIAGNOSIS — Z113 Encounter for screening for infections with a predominantly sexual mode of transmission: Secondary | ICD-10-CM | POA: Insufficient documentation

## 2020-04-15 DIAGNOSIS — Z1239 Encounter for other screening for malignant neoplasm of breast: Secondary | ICD-10-CM

## 2020-04-15 NOTE — Progress Notes (Signed)
Established Patient Office Visit  Subjective:  Patient ID: Lori Morse, female    DOB: Dec 28, 1965  Age: 54 y.o. MRN: [id number]  CC:  Chief Complaint  Patient presents with   Gynecologic Exam    HPI [name] is a 54 year old Hispanic female Spanish speaking only interpretor used Lori Morse [id number] presents for gyn exam and fasting labs.   Past Medical History:  Diagnosis Date   Bilateral bunions    Depression    Hyperlipidemia    Left sided sciatica 03/2017   resolved   Plantar fasciitis, right    PTSD (post-traumatic stress disorder)    Pulmonary nodules 04/17/2018   Bilateral, noted on CXR    Past Surgical History:  Procedure Laterality Date   BUNIONECTOMY Left 05/16/2019   Procedure: Altamese [place] WITH METATARSAL OSTEOTOMY;  Surgeon: Lori Morse, DPM;  Location: [place];  Service: Podiatry;  Laterality: Left;   BUNIONECTOMY Right 11/22/2019   Procedure: Pauline Good;  Surgeon: Lori Morse, DPM;  Location: [place];  Service: Podiatry;  Laterality: Right;  IV SEDATION   HALLUX VALGUS AUSTIN Right 05/17/2018   Procedure: HALLUX VALGUS AUSTIN RIGHT FOOT;  Surgeon: Lori Morse, DPM;  Location: [facility];  Service: Podiatry;  Laterality: Right;   HAMMER TOE SURGERY Right 11/22/2019   Procedure: HAMMER TOE REPAIR SECOND, THIRD,  FOURTH AND FIFTH TOES RIGHT FOOT;  Surgeon: Lori Morse, DPM;  Location: [facility];  Service: Podiatry;  Laterality: Right;  IV SEDATION   PLANTAR FASCIA RELEASE Right 05/17/2018   Procedure: PLANTAR FASCIOTOMY RIGHT FOOT;  Surgeon: Lori Morse, DPM;  Location: [place];  Service: Podiatry;  Laterality: Right;   Steroid Injections     foot    No family history on file.  Social History   Socioeconomic History   Marital status: Single    Spouse name: Not on file   Number of children: Not on file    Years of education: Not on file   Highest education level: Not on file  Occupational History   Not on file  Tobacco Use   Smoking status: Never Smoker   Smokeless tobacco: Never Used  Vaping Use   Vaping Use: Never used  Substance and Sexual Activity   Alcohol use: Never   Drug use: Never   Sexual activity: Yes    Birth control/protection: Post-menopausal  Other Topics Concern   Not on file  Social History Narrative   Not on file   Social Determinants of Health   Financial Resource Strain: Not on file  Food Insecurity: Not on file  Transportation Needs: Not on file  Physical Activity: Not on file  Stress: Not on file  Social Connections: Not on file  Intimate Partner Violence: Not on file    Outpatient Medications Prior to Visit  Medication Sig Dispense Refill   DULoxetine (CYMBALTA) 30 MG capsule Take 1 capsule (30 mg total) by mouth daily. 90 capsule 1   pravastatin (PRAVACHOL) 20 MG tablet Take 1 tablet (20 mg total) by mouth daily. 90 tablet 1   polyethylene glycol powder (GLYCOLAX/MIRALAX) 17 GM/SCOOP powder Take 17 g by mouth 2 (two) times daily as needed. (Patient not taking: Reported on 02/14/2020) 3350 g 1   No facility-administered medications prior to visit.    No Known Allergies  ROS Review of Systems  Musculoskeletal:       Right wrist ulnar  bone raised  All other systems reviewed and are negative.     Objective:    Physical Exam CONSTITUTIONAL: Well-developed, well-nourished obese female in no acute distress.  HENT:  Normocephalic, atraumatic, External right and left ear normal.  EYES: Conjunctivae and EOM are normal. Pupils are equal, round, and reactive to light. No scleral icterus.  NECK: Normal range of motion, supple, no masses.  Normal thyroid.  SKIN: Skin is warm and dry. No rash noted. Not diaphoretic. No erythema. No pallor. [place]: Alert and oriented to person, place, and time. Normal reflexes, muscle tone coordination.  No cranial nerve deficit noted. PSYCHIATRIC: Normal mood and affect. Normal behavior. Normal judgment and thought content. CARDIOVASCULAR: Normal heart rate noted, regular rhythm RESPIRATORY: Clear to auscultation bilaterally. Effort and breath sounds normal, no problems with respiration noted. BREASTS:Taught SBE and return demonstration. ABDOMEN: Soft, normal bowel sounds, no distention noted.  No tenderness, rebound or guarding.  PELVIC: Normal appearing external genitalia; normal appearing vaginal mucosa and cervix.  No abnormal discharge noted.  Pap smear obtained.  Normal uterine size, no other palpable masses, no uterine or adnexal tenderness. MUSCULOSKELETAL: Normal range of motion. No tenderness.  No cyanosis, clubbing, or edema.  2+ distal pulses. BP 121/79 (BP Location: Right Arm, Patient Position: Sitting, Cuff Size: Normal)    Pulse 69    Temp (!) 97.3 F (36.3 C) (Temporal)    Wt 189 lb 6.4 oz (85.9 kg)    LMP 09/02/2018 (Exact Date)    SpO2 97%    BMI 34.64 kg/m  Wt Readings from Last 3 Encounters:  04/15/20 189 lb 6.4 oz (85.9 kg)  02/14/20 190 lb 9.6 oz (86.5 kg)  11/22/19 190 lb 14.7 oz (86.6 kg)     Health Maintenance Due  Topic Date Due   Hepatitis C Screening  Never done   MAMMOGRAM  05/17/2019   PAP SMEAR-Modifier  04/13/2020    There are no preventive care reminders to display for this patient.  Lab Results  Component Value Date   TSH 1.600 11/21/2019   Lab Results  Component Value Date   WBC 7.5 11/23/2019   HGB 14.0 11/23/2019   HCT 42.4 11/23/2019   MCV 92.8 11/23/2019   PLT 233 11/23/2019   Lab Results  Component Value Date   NA 138 11/23/2019   K 4.1 11/23/2019   CO2 29 11/23/2019   GLUCOSE 135 (H) 11/23/2019   BUN 10 11/23/2019   CREATININE 0.80 11/23/2019   BILITOT 0.6 11/24/2019   ALKPHOS 72 11/24/2019   AST 19 11/24/2019   ALT 23 11/24/2019   PROT 6.9 11/24/2019   ALBUMIN 3.5 11/24/2019   CALCIUM 9.2 11/23/2019   ANIONGAP 10  11/23/2019   Lab Results  Component Value Date   CHOL 146 11/21/2019   Lab Results  Component Value Date   HDL 43 11/21/2019   Lab Results  Component Value Date   LDLCALC 76 11/21/2019   Lab Results  Component Value Date   TRIG 155 (H) 11/21/2019   Lab Results  Component Value Date   CHOLHDL 3.4 11/21/2019   No results found for: HGBA1C    Assessment & Plan:   Lori Morse was seen today for gynecologic exam.  Diagnoses and all orders for this visit:  Cervical cancer screening -     Cytology - PAP  Screening for STD (sexually transmitted disease) -     Cervicovaginal ancillary only  Hyperlipidemia, unspecified hyperlipidemia type Decrease your fatty foods, red meat, cheese,  milk and increase fiber like whole grains and veggies. You can also add a fiber supplement like Metamucil or Benefiber.  -     Lipid Panel -     CMP14+EGFR  Encounter for screening for malignant neoplasm of breast, unspecified screening modality -     MM Digital Diagnostic Bilat; Future   No orders of the defined types were placed in this encounter.   Follow-up: No follow-ups on file.    Kerin Perna, NP

## 2020-04-16 LAB — CMP14+EGFR
ALT: 44 IU/L — ABNORMAL HIGH (ref 0–32)
AST: 35 IU/L (ref 0–40)
Albumin/Globulin Ratio: 1.4 (ref 1.2–2.2)
Albumin: 4.4 g/dL (ref 3.8–4.9)
Alkaline Phosphatase: 116 IU/L (ref 44–121)
BUN/Creatinine Ratio: 18 (ref 9–23)
BUN: 12 mg/dL (ref 6–24)
Bilirubin Total: 0.4 mg/dL (ref 0.0–1.2)
CO2: 22 mmol/L (ref 20–29)
Calcium: 9.6 mg/dL (ref 8.7–10.2)
Chloride: 103 mmol/L (ref 96–106)
Creatinine, Ser: 0.66 mg/dL (ref 0.57–1.00)
GFR calc Af Amer: 117 mL/min/{1.73_m2} (ref >59)
GFR calc non Af Amer: 102 mL/min/{1.73_m2} (ref >59)
Globulin, Total: 3.1 g/dL (ref 1.5–4.5)
Glucose: 89 mg/dL (ref 65–99)
Potassium: 4.3 mmol/L (ref 3.5–5.2)
Sodium: 141 mmol/L (ref 134–144)
Total Protein: 7.5 g/dL (ref 6.0–8.5)

## 2020-04-16 LAB — CERVICOVAGINAL ANCILLARY ONLY
Bacterial Vaginitis (gardnerella): POSITIVE — AB
Candida Glabrata: NEGATIVE
Candida Vaginitis: NEGATIVE
Chlamydia: NEGATIVE
Comment: NEGATIVE
Comment: NEGATIVE
Comment: NEGATIVE
Comment: NEGATIVE
Comment: NEGATIVE
Comment: NORMAL
Neisseria Gonorrhea: NEGATIVE
Trichomonas: NEGATIVE

## 2020-04-16 LAB — LIPID PANEL
Chol/HDL Ratio: 2.5 ratio (ref 0.0–4.4)
Cholesterol, Total: 124 mg/dL (ref 100–199)
HDL: 50 mg/dL (ref >39)
LDL Chol Calc (NIH): 61 mg/dL (ref 0–99)
Triglycerides: 60 mg/dL (ref 0–149)
VLDL Cholesterol Cal: 13 mg/dL (ref 5–40)

## 2020-04-17 LAB — CYTOLOGY - PAP: Diagnosis: NEGATIVE

## 2020-04-22 ENCOUNTER — Other Ambulatory Visit (INDEPENDENT_AMBULATORY_CARE_PROVIDER_SITE_OTHER): Payer: Self-pay | Admitting: Primary Care

## 2020-04-22 DIAGNOSIS — B9689 Other specified bacterial agents as the cause of diseases classified elsewhere: Secondary | ICD-10-CM

## 2020-04-22 MED ORDER — METRONIDAZOLE 500 MG PO TABS: 500.0000 mg | ORAL_TABLET | Freq: Two times a day (BID) | ORAL | 0 refills | Status: DC

## 2020-04-23 MED FILL — ?METRONIDAZOLE 500 MG TABS: 500 | 7 days supply | Qty: 14 | Fill #0

## 2020-05-06 ENCOUNTER — Encounter (INDEPENDENT_AMBULATORY_CARE_PROVIDER_SITE_OTHER): Payer: Self-pay

## 2020-05-08 ENCOUNTER — Other Ambulatory Visit (INDEPENDENT_AMBULATORY_CARE_PROVIDER_SITE_OTHER): Payer: Self-pay | Admitting: Primary Care

## 2020-05-08 DIAGNOSIS — F32A Depression, unspecified: Secondary | ICD-10-CM

## 2020-05-08 DIAGNOSIS — Z760 Encounter for issue of repeat prescription: Secondary | ICD-10-CM

## 2020-05-08 DIAGNOSIS — M722 Plantar fascial fibromatosis: Secondary | ICD-10-CM

## 2020-05-08 NOTE — Telephone Encounter (Signed)
Sent to PCP ?

## 2020-05-09 ENCOUNTER — Other Ambulatory Visit (INDEPENDENT_AMBULATORY_CARE_PROVIDER_SITE_OTHER): Payer: Self-pay | Admitting: Primary Care

## 2020-05-11 MED FILL — ?PRAVASTATIN NA 20MG TABL: 20 | 30 days supply | Qty: 30 | Fill #0

## 2020-05-11 MED FILL — ?DULoxetine HCL 30MG CPEP: 30 | 30 days supply | Qty: 30 | Fill #0

## 2020-06-05 MED FILL — ?METRONIDAZOLE 500 MG TABS: 500 | 7 days supply | Qty: 14 | Fill #0

## 2020-06-05 MED FILL — ?DULoxetine HCL 30MG CPEP: 30 | 30 days supply | Qty: 30 | Fill #0

## 2020-06-05 MED FILL — PRAVASTATIN SODIUM 20 MG TA: 20 | 30 days supply | Qty: 30 | Fill #0

## 2020-07-20 MED FILL — DULoxetine HCL 30 MG CPEP: 30 | 30 days supply | Qty: 30 | Fill #1

## 2020-07-20 MED FILL — PRAVASTATIN SODIUM 20 MG TA: 20 | 30 days supply | Qty: 30 | Fill #1

## 2020-10-14 ENCOUNTER — Ambulatory Visit (INDEPENDENT_AMBULATORY_CARE_PROVIDER_SITE_OTHER): Payer: Self-pay | Admitting: Primary Care

## 2020-11-13 ENCOUNTER — Telehealth (INDEPENDENT_AMBULATORY_CARE_PROVIDER_SITE_OTHER): Payer: Self-pay | Admitting: Primary Care

## 2020-11-13 NOTE — Telephone Encounter (Signed)
Pt is calling to speak to [facility] regarding CAFA application. Pt is requesting an appointment on a Monday. [phone]

## 2020-11-16 NOTE — Telephone Encounter (Signed)
I return Pt call, schedule a financial appt for 11/23/20

## 2020-11-23 ENCOUNTER — Other Ambulatory Visit: Payer: Self-pay

## 2020-11-23 ENCOUNTER — Other Ambulatory Visit (INDEPENDENT_AMBULATORY_CARE_PROVIDER_SITE_OTHER): Payer: Self-pay | Admitting: Primary Care

## 2020-11-23 ENCOUNTER — Ambulatory Visit: Payer: Self-pay | Attending: Primary Care

## 2020-11-23 DIAGNOSIS — M722 Plantar fascial fibromatosis: Secondary | ICD-10-CM

## 2020-11-23 DIAGNOSIS — F32A Depression, unspecified: Secondary | ICD-10-CM

## 2020-11-23 DIAGNOSIS — Z760 Encounter for issue of repeat prescription: Secondary | ICD-10-CM

## 2020-11-23 NOTE — Telephone Encounter (Signed)
Notes to clinic:  Patient has appt on 11/30/2020 Review for refill    Requested Prescriptions  Pending Prescriptions Disp Refills   DULoxetine (CYMBALTA) 30 MG capsule 30 capsule 1    Sig: TAKE 1 CAPSULE (30 MG TOTAL) BY MOUTH DAILY.      Psychiatry: Antidepressants - SNRI Failed - 11/23/2020 10:16 AM      Failed - Valid encounter within last 6 months    Recent Outpatient Visits           7 months ago Cervical cancer screening   [place] Kerin Perna, NP   9 months ago Obesity (BMI 30.0-34.9)   [place] [facility] Kerin Perna, NP   1 year ago Obesity (BMI 30.0-34.9)   [place] [facility] Kerin Perna, NP   1 year ago Encounter for screening for malignant neoplasm of breast, unspecified screening modality   [place] Kerin Perna, NP   1 year ago Right foot pain   [place] Kerin Perna, NP       Future Appointments             In 1 week Kerin Perna, NP [facility]             Passed - Last BP in normal range    BP Readings from Last 1 Encounters:  04/15/20 121/79            pravastatin (PRAVACHOL) 20 MG tablet 30 tablet 1    Sig: TAKE 1 TABLET (20 MG TOTAL) BY MOUTH DAILY.      Cardiovascular:  Antilipid - Statins Passed - 11/23/2020 10:16 AM      Passed - Total Cholesterol in normal range and within 360 days    Cholesterol, Total  Date Value Ref Range Status  04/15/2020 124 100 - 199 mg/dL Final          Passed - LDL in normal range and within 360 days    LDL Chol Calc (NIH)  Date Value Ref Range Status  04/15/2020 61 0 - 99 mg/dL Final          Passed - HDL in normal range and within 360 days    HDL  Date Value Ref Range Status  04/15/2020 50 >39 mg/dL Final          Passed - Triglycerides in normal range and within 360 days    Triglycerides  Date Value Ref Range Status   04/15/2020 60 0 - 149 mg/dL Final          Passed - Patient is not pregnant      Passed - Valid encounter within last 12 months    Recent Outpatient Visits           7 months ago Cervical cancer screening   [place], Michelle P, NP   9 months ago Obesity (BMI 30.0-34.9)   [place] [facility] Kerin Perna, NP   1 year ago Obesity (BMI 30.0-34.9)   [facility] Kerin Perna, NP   1 year ago Encounter for screening for malignant neoplasm of breast, unspecified screening modality   [place] Kerin Perna, NP   1 year ago Right foot pain   [facility] Kerin Perna, NP       Future Appointments  In 1 week Kerin Perna, NP [place]

## 2020-11-24 ENCOUNTER — Other Ambulatory Visit: Payer: Self-pay

## 2020-11-24 MED ORDER — DULOXETINE HCL 30 MG PO CPEP
ORAL_CAPSULE | Freq: Every day | ORAL | 1 refills | Status: DC
  Filled 2020-11-24: qty 30, 30d supply, fill #0

## 2020-11-24 MED ORDER — PRAVASTATIN SODIUM 20 MG PO TABS
ORAL_TABLET | Freq: Every day | ORAL | 1 refills | Status: DC
  Filled 2020-11-24: qty 30, 30d supply, fill #0

## 2020-11-30 ENCOUNTER — Other Ambulatory Visit: Payer: Self-pay

## 2020-11-30 ENCOUNTER — Ambulatory Visit (INDEPENDENT_AMBULATORY_CARE_PROVIDER_SITE_OTHER): Payer: Self-pay | Admitting: Primary Care

## 2020-11-30 ENCOUNTER — Telehealth: Payer: Self-pay

## 2020-11-30 DIAGNOSIS — M722 Plantar fascial fibromatosis: Secondary | ICD-10-CM

## 2020-11-30 DIAGNOSIS — Z760 Encounter for issue of repeat prescription: Secondary | ICD-10-CM

## 2020-11-30 DIAGNOSIS — F32A Depression, unspecified: Secondary | ICD-10-CM

## 2020-11-30 MED ORDER — PRAVASTATIN SODIUM 20 MG PO TABS
ORAL_TABLET | Freq: Every day | ORAL | 1 refills | Status: DC
  Filled 2020-11-30: qty 30, 30d supply, fill #0
  Filled 2020-11-30: qty 90, fill #0
  Filled 2020-12-11: qty 90, 90d supply, fill #0
  Filled 2021-04-14: qty 90, 90d supply, fill #1

## 2020-11-30 MED ORDER — DULOXETINE HCL 30 MG PO CPEP
ORAL_CAPSULE | Freq: Every day | ORAL | 1 refills | Status: DC
  Filled 2020-11-30: qty 90, fill #0
  Filled 2020-11-30: qty 30, 30d supply, fill #0
  Filled 2020-12-11: qty 90, 90d supply, fill #0
  Filled 2021-04-14: qty 90, 90d supply, fill #1
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
## 2020-11-30 NOTE — Telephone Encounter (Signed)
Call received from Juluis Mire, NP noting patient is having difficulty affording medications. Informed her per [facility], patient can use a one time free fill to obtain her medications for no cost. This would be for a 30 day supply.  She can then work with the pharmacy to obtain pravastatin through [place] and cymbalta with Patient Assistance.

## 2020-12-07 ENCOUNTER — Other Ambulatory Visit: Payer: Self-pay

## 2020-12-11 ENCOUNTER — Other Ambulatory Visit: Payer: Self-pay

## 2020-12-28 ENCOUNTER — Institutional Professional Consult (permissible substitution): Payer: Self-pay | Admitting: Clinical

## 2021-04-14 ENCOUNTER — Other Ambulatory Visit: Payer: Self-pay

## 2021-04-19 ENCOUNTER — Other Ambulatory Visit: Payer: Self-pay

## 2021-11-24 ENCOUNTER — Emergency Department (HOSPITAL_COMMUNITY)
Admission: EM | Admit: 2021-11-24 | Discharge: 2021-11-24 | Disposition: A | Discharge status: Home / Self Care | Payer: Self-pay | Attending: Emergency Medicine | Admitting: Emergency Medicine

## 2021-11-24 ENCOUNTER — Other Ambulatory Visit: Payer: Self-pay

## 2021-11-24 DIAGNOSIS — X102XXA Contact with fats and cooking oils, initial encounter: Secondary | ICD-10-CM | POA: Insufficient documentation

## 2021-11-24 DIAGNOSIS — Z23 Encounter for immunization: Secondary | ICD-10-CM | POA: Insufficient documentation

## 2021-11-24 DIAGNOSIS — Y9289 Other specified places as the place of occurrence of the external cause: Secondary | ICD-10-CM | POA: Insufficient documentation

## 2021-11-24 DIAGNOSIS — T22131A Burn of first degree of right upper arm, initial encounter: Secondary | ICD-10-CM | POA: Insufficient documentation

## 2021-11-24 DIAGNOSIS — T300 Burn of unspecified body region, unspecified degree: Secondary | ICD-10-CM

## 2021-11-24 DIAGNOSIS — Y990 Civilian activity done for income or pay: Secondary | ICD-10-CM | POA: Insufficient documentation

## 2021-11-24 MED ORDER — TETANUS-DIPHTH-ACELL PERTUSSIS 5-2.5-18.5 LF-MCG/0.5 IM SUSY
0.5000 mL | PREFILLED_SYRINGE | Freq: Once | INTRAMUSCULAR | Status: AC
  Administered 2021-11-24: 0.5 mL via INTRAMUSCULAR
  Filled 2021-11-24: qty 0.5

## 2021-11-24 MED ORDER — OXYCODONE-ACETAMINOPHEN 5-325 MG PO TABS
1.0000 | ORAL_TABLET | Freq: Once | ORAL | Status: AC
  Administered 2021-11-24: 1 via ORAL
  Filled 2021-11-24: qty 1

## 2021-11-24 MED ORDER — BACITRACIN ZINC 500 UNIT/GM EX OINT
TOPICAL_OINTMENT | CUTANEOUS | Status: AC
  Administered 2021-11-24: 1
  Filled 2021-11-24: qty 0.9
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
## 2021-11-30 ENCOUNTER — Ambulatory Visit (INDEPENDENT_AMBULATORY_CARE_PROVIDER_SITE_OTHER): Payer: Commercial Managed Care - HMO | Admitting: Primary Care

## 2021-11-30 ENCOUNTER — Encounter (INDEPENDENT_AMBULATORY_CARE_PROVIDER_SITE_OTHER): Payer: Self-pay | Admitting: Primary Care

## 2021-11-30 VITALS — BP 118/74 | HR 55 | Temp 98.5°F | Ht 62.0 in | Wt 180.0 lb

## 2021-11-30 DIAGNOSIS — R7303 Prediabetes: Secondary | ICD-10-CM

## 2021-11-30 DIAGNOSIS — M722 Plantar fascial fibromatosis: Secondary | ICD-10-CM | POA: Diagnosis not present

## 2021-11-30 DIAGNOSIS — Z131 Encounter for screening for diabetes mellitus: Secondary | ICD-10-CM

## 2021-11-30 DIAGNOSIS — E785 Hyperlipidemia, unspecified: Secondary | ICD-10-CM | POA: Diagnosis not present

## 2021-11-30 DIAGNOSIS — E669 Obesity, unspecified: Secondary | ICD-10-CM | POA: Diagnosis not present

## 2021-11-30 DIAGNOSIS — Z1211 Encounter for screening for malignant neoplasm of colon: Secondary | ICD-10-CM

## 2021-11-30 DIAGNOSIS — Z1231 Encounter for screening mammogram for malignant neoplasm of breast: Secondary | ICD-10-CM

## 2021-11-30 NOTE — Patient Instructions (Addendum)
Recuento de caloras para bajar de peso Calorie Counting for Weight Loss Las caloras son unidades de energa. El cuerpo necesita una cierta cantidad de caloras de los alimentos para que lo ayuden a funcionar durante todo el da. Cuando se comen o beben ms caloras de las que el cuerpo necesita, este acumula las caloras adicionales mayormente como grasa. Cuando se comen o beben menos caloras de las que el cuerpo necesita, este quema grasa para obtener la energa que necesita. El recuento de caloras es el registro de la cantidad de caloras que se comen y beben cada da. El recuento de caloras puede ser de ayuda si necesita perder peso. Si come menos caloras de las que el cuerpo necesita, debera bajar de peso. Pregntele al mdico cul es un peso sano para usted. Para que el recuento de caloras funcione, usted tendr que ingerir la cantidad de caloras adecuadas cada da, para bajar una cantidad de peso saludable por semana. Un nutricionista puede ayudar a determinar la cantidad de caloras que usted necesita por da y sugerirle formas de alcanzar su objetivo calrico. Una cantidad de peso saludable para bajar cada semana suele ser entre 1 y 2 libras (0.5 a 0.9 kg). Esto habitualmente significa que su ingesta diaria de caloras se debera reducir en unas 500 a 750 caloras. Ingerir de 1200 a 1500 caloras por da puede ayudar a la mayora de las mujeres a bajar de peso. Ingerir de 1500 a 1800 caloras por da puede ayudar a la mayora de los hombres a bajar de peso. Qu debo saber acerca del recuento de caloras? Trabaje con el mdico o el nutricionista para determinar cuntas caloras debe recibir cada da. A fin de alcanzar su objetivo diario de caloras, tendr que: Averiguar cuntas caloras hay en cada alimento que le gustara comer. Intente hacerlo antes de comer. Decidir la cantidad que puede comer del alimento. Llevar un registro de los alimentos. Para esto, anote lo que comi y cuntas  caloras tena. Para perder peso con xito, es importante equilibrar el recuento de caloras con un estilo de vida saludable que incluya actividad fsica de forma regular. Dnde encuentro informacin sobre las caloras?  Es posible encontrar la cantidad de caloras que contiene un alimento en la etiqueta de informacin nutricional. Si un alimento no tiene una etiqueta de informacin nutricional, intente buscar las caloras en Internet o pida ayuda al nutricionista. Recuerde que las caloras se calculan por porcin. Si opta por comer ms de una porcin de un alimento, tendr que multiplicar las caloras de una porcin por la cantidad de porciones que planea comer. Por ejemplo, la etiqueta de un envase de pan puede decir que el tamao de una porcin es 1 rodaja, y que una porcin tiene 90 caloras. Si come 1 rodaja, habr comido 90 caloras. Si come 2 rodajas, habr comido 180 caloras. Cmo llevo un registro de comidas? Despus de cada vez que coma, anote lo siguiente en el registro de alimentos lo antes posible: Lo que comi. Asegrese de incluir los aderezos, las salsas y otros extras en los alimentos. La cantidad que comi. Esto se puede medir en tazas, onzas o cantidad de alimentos. Cuntas caloras haba en cada alimento y en cada bebida. La cantidad total de caloras en la comida que tom. Tenga a mano el registro de alimentos, por ejemplo, en un anotador de bolsillo o utilice una aplicacin o sitio web en el telfono mvil. Algunos programas calcularn las caloras por usted y le mostrarn la cantidad de   caloras que le quedan para llegar al objetivo diario. Cules son algunos consejos para controlar las porciones? Sepa cuntas caloras hay en una porcin. Esto lo ayudar a saber cuntas porciones de un alimento determinado puede comer. Use una taza medidora para medir los tamaos de las porciones. Tambin puede intentar pesar las porciones en una balanza de cocina. Con el tiempo, podr hacer  un clculo estimativo de los tamaos de las porciones de algunos alimentos. Dedique tiempo a poner porciones de diferentes alimentos en sus platos, tazones y tazas predilectos, a fin de saber cmo se ve una porcin. Intente no comer directamente de un envase de alimentos, por ejemplo, de una bolsa o una caja. Comer directamente del envase dificulta ver cunto est comiendo y puede conducir a comer en exceso. Ponga la cantidad que le gustara comer en una taza o un plato, a fin de asegurarse de que est comiendo la porcin correcta. Use platos, vasos y tazones ms pequeos para medir porciones ms pequeas y evitar no comer en exceso. Intente no realizar varias tareas al mismo tiempo. Por ejemplo, evite mirar televisin o usar la computadora mientras come. Si es la hora de comer, sintese a la mesa y disfrute de la comida. Esto lo ayudar a reconocer cundo est satisfecho. Tambin le permitir estar ms consciente de qu come y cunto come. Consejos para seguir este plan Al leer las etiquetas de los alimentos Controle el recuento de caloras en comparacin con el tamao de la porcin. El tamao de la porcin puede ser ms pequeo de lo que suele comer. Verifique la fuente de las caloras. Intente elegir alimentos ricos en protenas, fibras y vitaminas, y bajos en grasas saturadas, grasas trans y sodio. Al ir de compras Lea las etiquetas nutricionales cuando compre. Esto lo ayudar a tomar decisiones saludables sobre qu alimentos comprar. Preste atencin a las etiquetas nutricionales de alimentos bajos en grasas o sin grasas. Estos alimentos a veces tienen la misma cantidad de caloras o ms caloras que las versiones ricas en grasas. Con frecuencia, tambin tienen agregados de azcar, almidn o sal, para darles el sabor que fue eliminado con las grasas. Haga una lista de compras con los alimentos que tienen un menor contenido de caloras y resptela. Al cocinar Intente cocinar sus alimentos preferidos  de una manera ms saludable. Por ejemplo, pruebe hornear en vez de frer. Utilice productos lcteos descremados. Planificacin de las comidas Utilice ms frutas y verduras. La mitad de su plato debe ser de frutas y verduras. Incluya protenas magras, como pollo, pavo y pescado. Estilo de vida Cada semana, trate de hacer una de las siguientes cosas: 150 minutos de ejercicio moderado, como caminar. 75 minutos de ejercicio enrgico, como correr. Informacin general Sepa cuntas caloras tienen los alimentos que come con ms frecuencia. Esto le ayudar a contar las caloras ms rpidamente. Encuentre un mtodo para controlar las caloras que funcione para usted. Sea creativo. Pruebe aplicaciones o programas distintos, si llevar un registro de las caloras no funciona para usted. Qu alimentos debo consumir?  Consuma alimentos nutritivos. Es mejor comer un alimento nutritivo, de alto contenido calrico, como un aguacate, que uno con pocos nutrientes, como una bolsa de patatas fritas. Use sus caloras en alimentos y bebidas que lo sacien y no lo dejen con apetito apenas termina de comer. Ejemplos de alimentos que lo sacian son los frutos secos y mantequillas de frutos secos, verduras, protenas magras y alimentos con alto contenido de fibra como los cereales integrales. Los alimentos con alto   contenido de fibra son aquellos que tienen ms de 5 g de fibra por porcin. Preste atencin a las caloras en las bebidas. Las bebidas de bajas caloras incluyen agua y refrescos sin azcar. Es posible que los productos que se enumeran ms arriba no constituyan una lista completa de los alimentos y las bebidas que puede tomar. Consulte a un nutricionista para obtener ms informacin. Qu alimentos debo limitar? Limite el consumo de alimentos o bebidas que no sean buenas fuentes de vitaminas, minerales o protenas, o que tengan alto contenido de grasas no saludables. Estos incluyen: Caramelos. Otros  dulces. Refrescos, bebidas con caf especiales, alcohol y jugo. Es posible que los productos que se enumeran ms arriba no constituyan una lista completa de los alimentos y las bebidas que debe evitar. Consulte a un nutricionista para obtener ms informacin. Cmo puedo hacer el recuento de caloras cuando como afuera? Preste atencin a las porciones. A menudo, las porciones son mucho ms grandes al comer afuera. Pruebe con estos consejos para mantener las porciones ms pequeas: Considere la posibilidad de compartir una comida en lugar de tomarla toda usted solo. Si pide su propia comida, coma solo la mitad. Antes de empezar a comer, pida un recipiente y ponga la mitad de la comida en l. Cuando sea posible, considere la posibilidad de pedir porciones ms pequeas del men en lugar de porciones completas. Preste atencin a la eleccin de alimentos y bebidas. Saber la forma en que se cocinan los alimentos y lo que incluye la comida puede ayudarlo a ingerir menos caloras. Si se detallan las caloras en el men, elija las opciones que contengan la menor cantidad. Elija platos que incluyan verduras, frutas, cereales integrales, productos lcteos con bajo contenido de grasa y protenas magras. Opte por los alimentos hervidos, asados, cocidos a la parrilla o al vapor. Evite los alimentos a los que se les ponga mantequilla, que estn empanados o fritos, o que se sirvan con salsa a base de crema. Generalmente, los alimentos que se etiquetan como "crujientes" estn fritos, a menos que se indique lo contrario. Elija el agua, la leche descremada, el t helado sin azcar u otras bebidas que no contengan azcares agregados. Si desea una bebida alcohlica, escoja una opcin con menos caloras, como una copa de vino o una cerveza ligera. Ordene los aderezos, las salsas y los jarabes aparte. Estos son, con frecuencia, de alto contenido en caloras, por lo que debe limitar la cantidad que ingiere. Si desea una  ensalada, elija una de hortalizas y pida carnes a la parrilla. Evite las guarniciones adicionales como el tocino, el queso o los alimentos fritos. Ordene el aderezo aparte o pida aceite de oliva y vinagre o limn para aderezar. Haga un clculo estimativo de la cantidad de porciones que le sirven. Conocer el tamao de las porciones lo ayudar a estar atento a la cantidad de comida que come en los restaurantes. Dnde buscar ms informacin Centers for Disease Control and Prevention (Centros para el Control y la Prevencin de Enfermedades): www.cdc.gov U.S. Department of Agriculture (Departamento de Agricultura de los EE. UU.): myplate.gov Resumen El recuento de caloras es el registro de la cantidad de caloras que se comen y beben cada da. Si come menos caloras de las que el cuerpo necesita, debera bajar de peso. Una cantidad de peso saludable para bajar por semana suele ser entre 1 y 2 libras (0.5 a 0.9 kg). Esto significa, con frecuencia, reducir su ingesta diaria de caloras unas 500 a 750 caloras. Es posible   encontrar la cantidad de caloras que contiene un alimento en la etiqueta de informacin nutricional. Si un alimento no tiene una etiqueta de informacin nutricional, intente buscar las caloras en Internet o pida ayuda al nutricionista. Use platos, vasos y tazones ms pequeos para medir porciones ms pequeas y Product/process development scientist no comer en exceso. Use sus caloras en alimentos y bebidas que lo sacien y no lo dejen con apetito poco tiempo despus de haber comido. Esta informacin no tiene Marine scientist el consejo del mdico. Asegrese de hacerle al mdico cualquier pregunta que tenga. Document Revised: 08/12/2019 Document Reviewed: 08/12/2019 Elsevier Patient Education  [place]. Fascitis plantar Plantar Fasciitis  La fascitis plantar es una afeccin dolorosa que se produce en el taln. Ocurre cuando la banda de tejido que [employer] dedos con el hueso del taln (fascia plantar)  se irrita. Esto puede ocurrir por Financial planner ejercicio u otras actividades repetitivas (lesin por uso excesivo). La fascitis plantar puede causar desde una leve irritacin hasta dolor intenso que dificulta que la persona camine o se [place]. Por lo general, el dolor es peor a la maana despus de dormir, o despus de Public affairs consultant sentado o acostado durante un perodo de [place]. El dolor tambin puede empeorar despus de caminar o estar de pie por [employer]. Cules son las causas? Esta afeccin puede ser causada por lo siguiente: Estar de pie durante largos perodos. Usar zapatos que no tengan un buen soporte para el arco. Realizar actividades que implican esfuerzo para las articulaciones (actividades de alto impacto). Esto incluye el ballet y la actividad fsica que hace que el corazn lata ms rpido (ejercicio aerbico), Solicitor. Tener sobrepeso. Tener una forma de caminar (andar) anormal. Presentar rigidez muscular en la parte posterior de la parte inferior de la pierna (pantorrilla). Arcos [employer] o pies planos. Comenzar una nueva actividad fsica. Cules son los signos o sntomas? El sntoma principal de esta afeccin es el dolor en el taln. El dolor puede empeorar despus de lo siguiente: Con los primeros pasos luego de estar en reposo, especialmente por la maana despus de dormir o de haber estado sentado o acostado durante un [place]. Largos perodos de Public affairs consultant de pie. El dolor puede disminuir despus de 30 a 45 minutos de [place], como caminar apaciblemente. Cmo se diagnostica? Esta afeccin se puede diagnosticar en funcin de los antecedentes mdicos, un examen fsico y los sntomas. El mdico controlar lo siguiente: Un rea dolorida en la parte inferior del pie. [place] alto en el pie o pies planos. Dolor al Agricultural consultant. Dificultad para mover el pie. Pueden realizarle estudios de diagnstico por imagen para confirmar el diagnstico, por  ejemplo: Radiografas. Ecografa. Resonancia magntica (RM). Cmo se trata? El tratamiento de la fascitis plantar depende de la gravedad de su afeccin. El tratamiento puede incluir: Reposo, hielo, presin (compresin) y Lexicographer (elevar) el pie afectado. Esto se denomina tratamiento de RHCE (reposo, hielo, compresin, elevacin). El mdico puede recomendarle terapia de RHCE junto con medicamentos de venta libre para Best boy. Ejercicios para [place] pantorrillas y la fascia plantar. Una frula que [employer] estirado y [place] mientras usted duerme (frula nocturna). Fisioterapia para [employer] sntomas y Customer service manager en el futuro. Inyecciones de medicamentos con corticoesteroides (cortisona) para Best boy y la inflamacin. Estimular su fascia plantar lesionada con impulsos elctricos (tratamiento con ondas de choque extracorpreas). Esto generalmente es la ltima opcin de tratamiento antes de la [place]. [place], si los otros  tratamientos no han funcionado despus de 12 meses. Siga estas instrucciones en su casa: Control del dolor, la rigidez y la hinchazn  Si se lo indican, aplique hielo sobre la zona dolorida. Para hacer esto: Ponga el hielo en una bolsa de plstico o use una botella de [place]. Coloque una toalla entre la piel y la bolsa de hielo o la botella. Frote la parte inferior del pie sobre la bolsa o la botella. Haga esto durante 20 minutos, de 2 a 3 veces al [employer]. Use calzado deportivo con amortiguacin de aire o gel, o pruebe usar plantillas blandas diseadas para la fascitis plantar. Cuando est sentado o acostado, eleve el pie por encima del nivel del corazn. Actividad Evite las actividades que le causan dolor. Pregntele al mdico qu actividades son seguras para usted. Haga los ejercicios de fisioterapia y estiramiento como se lo haya indicado el mdico. Intente hacer actividades y tipos de ejercicio que sean ms suaves para  las articulaciones (de bajo impacto). Por ejemplo, nadar, hacer ejercicios [employer], y Catering manager. Instrucciones generales Use los medicamentos de venta libre y los recetados solamente como se lo haya indicado el mdico. Si el mdico se lo indica, use una frula nocturna para dormir. Afloje la frula si los dedos de los pies se le entumecen, siente hormigueos o se le enfran y se tornan de Optician, dispensing. Mantenga un peso saludable, o colabore con su mdico para perder [employer]. Cumpla con todas las visitas de seguimiento. Esto es importante. Comunquese con un mdico si tiene: Sntomas que no desaparecen con Teacher, early years/pre. Dolor que [place]. Dolor que afecta su capacidad de moverse o de Optometrist sus actividades diarias. Resumen La fascitis plantar es una afeccin dolorosa que se produce en el taln. Ocurre cuando la banda de tejido que [employer] dedos con el hueso del taln (fascia plantar) se irrita. El dolor en el taln es el sntoma principal de esta afeccin. Puede empeorar despus de Buyer, retail ejercicio o de Public affairs consultant quieto de pie durante mucho tiempo. El tratamiento vara, pero normalmente comienza con el reposo, la aplicacin de hielo, la aplicacin de presin (compresin) y la elevacin del pie afectado. Esto se denomina tratamiento de RHCE (reposo, hielo, compresin, elevacin). Tambin se pueden usar analgsicos de venta libre para Financial controller. Esta informacin no tiene Marine scientist el consejo del mdico. Asegrese de hacerle al mdico cualquier pregunta que tenga. Document Revised: 10/21/2019 Document Reviewed: 10/21/2019 Elsevier Patient Education  [place].

## 2021-11-30 NOTE — Progress Notes (Signed)
Lori Morse, is a 56 y.o. female  XBJ:[id number]  HYQ:[id number]  DOB - 04-20-1966  Chief Complaint  Patient presents with   Hospitalization Follow-up    First degree burn  Wants Rx for something to help her not eat as much        Subjective:   [name] is a 56 y.o. 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 Also, requesting something to help loose weight and curve her appetite Body mass index is 32.92 kg/m.  Patient has No headache, No chest pain, No abdominal pain - No Nausea, No new weakness tingling or numbness, No Cough - shortness of breath  No problems updated.  No Known Allergies  Past Medical History:  Diagnosis Date   Bilateral bunions    Depression    Hyperlipidemia    Left sided sciatica 03/2017   resolved   Plantar fasciitis, right    PTSD (post-traumatic stress disorder)    Pulmonary nodules 04/17/2018   Bilateral, noted on CXR    Current Outpatient Medications on File Prior to Visit  Medication Sig Dispense Refill   DULoxetine (CYMBALTA) 30 MG capsule TAKE 1 CAPSULE (30 MG TOTAL) BY MOUTH DAILY. (Patient not taking: Reported on 11/30/2021) 90 capsule 1   pravastatin (PRAVACHOL) 20 MG tablet TAKE 1 TABLET (20 MG TOTAL) BY MOUTH DAILY. (Patient not taking: Reported on 11/30/2021) 90 tablet 1   No current facility-administered medications on file prior to visit.   Comprehensive ROS Pertinent positive and negative noted in HPI   Objective:   Vitals:   11/30/21 0834  BP: 118/74  Pulse: (!) 55  Temp: 98.5 F (36.9 C)  TempSrc: Oral  SpO2: 98%  Weight: 180 lb (81.6 kg)  Height: 5' 2" (1.575 m)    Exam General appearance : Awake, alert, not in any distress. Obese female ,Speech Clear. Not toxic looking HEENT: Atraumatic and Normocephalic, pupils equally reactive to light and accomodation Neck: Supple, no JVD. No cervical lymphadenopathy.  Chest: Good  air entry bilaterally, no added sounds  CVS: S1 S2 regular, no murmurs.  Abdomen: Bowel sounds present, Non tender and not distended with no gaurding, rigidity or rebound. Extremities: B/L Lower Ext shows no edema, both legs are warm to touch Neurology: Awake alert, and oriented X 3, Non focal Skin: No Rash  Data Review No results found for: "HGBA1C"  Assessment & Plan  Janett was seen today for hospitalization follow-up.  Diagnoses and all orders for this visit:  Plantar fasciitis of right foot -     Ambulatory referral to Podiatry  Obesity (BMI 30.0-34.9) Obesity is 30-39 indicating an excess in caloric intake or underlining conditions. This may lead to other co-morbidities. Lifestyle modifications of diet and exercise may reduce obesity.   AVS provided for weight loss  Encounter for screening mammogram for malignant neoplasm of breast -     MM DIGITAL SCREENING BILATERAL; Future -     CMP14+EGFR  Colon cancer screening Health maintenance  -     Cologuard  Screening for diabetes mellitus Father has diabetes -     HgB A1c 6.0  -     CBC with Differential  Hyperlipidemia, unspecified hyperlipidemia type  Healthy lifestyle diet of fruits vegetables fish nuts whole grains and low saturated fat . Foods high in cholesterol or liver, fatty meats,cheese, butter avocados, nuts and seeds, chocolate and fried foods. -     Lipid Panel  Prediabetes New dx Prediabetes  Monitor foods that are high in carbohydrates are the following rice, potatoes, breads, sugars, and pastas.  Reduction in the intake (eating) will assist in lowering your blood sugars. Exercise 74mns daily or 150 mins weekly loose weight. No medication at this time. Life style modifications only.  Patient have been counseled extensively about nutrition and exercise. Other issues discussed during this visit include: low cholesterol diet, weight control and daily exercise, foot care, annual eye examinations at Ophthalmology,  importance of adherence with medications and regular follow-up. We also discussed long term complications of uncontrolled diabetes and hypertension.   Return in about 6 months (around 06/02/2022) for hyperlipidemia /weight evaluation.  The patient was given clear instructions to go to ER or return to medical center if symptoms don't improve, worsen or new problems develop. The patient verbalized understanding. The patient was told to call to get lab results if they haven't heard anything in the next week.   This note has been created with D[employer], quantity Any transcriptional errors are unintentional.   MKerin Perna NP 12/01/2021, 1:25 PM

## 2021-12-01 LAB — CMP14+EGFR
ALT: 18 IU/L (ref 0–32)
AST: 23 IU/L (ref 0–40)
Albumin/Globulin Ratio: 1.4 (ref 1.2–2.2)
Albumin: 4.3 g/dL (ref 3.8–4.9)
Alkaline Phosphatase: 94 IU/L (ref 44–121)
BUN/Creatinine Ratio: 24 — ABNORMAL HIGH (ref 9–23)
BUN: 18 mg/dL (ref 6–24)
Bilirubin Total: 0.4 mg/dL (ref 0.0–1.2)
CO2: 25 mmol/L (ref 20–29)
Calcium: 9.7 mg/dL (ref 8.7–10.2)
Chloride: 102 mmol/L (ref 96–106)
Creatinine, Ser: 0.75 mg/dL (ref 0.57–1.00)
Globulin, Total: 3 g/dL (ref 1.5–4.5)
Glucose: 92 mg/dL (ref 70–99)
Potassium: 4.4 mmol/L (ref 3.5–5.2)
Sodium: 142 mmol/L (ref 134–144)
Total Protein: 7.3 g/dL (ref 6.0–8.5)
eGFR: 95 mL/min/{1.73_m2} (ref >59)

## 2021-12-01 LAB — CBC WITH DIFFERENTIAL/PLATELET
Basophils Absolute: 0 10*3/uL (ref 0.0–0.2)
Basos: 1 %
EOS (ABSOLUTE): 0.3 10*3/uL (ref 0.0–0.4)
Eos: 5 %
Hematocrit: 44.8 % (ref 34.0–46.6)
Hemoglobin: 14.6 g/dL (ref 11.1–15.9)
Immature Grans (Abs): 0 10*3/uL (ref 0.0–0.1)
Immature Granulocytes: 0 %
Lymphocytes Absolute: 2 10*3/uL (ref 0.7–3.1)
Lymphs: 40 %
MCH: 30.1 pg (ref 26.6–33.0)
MCHC: 32.6 g/dL (ref 31.5–35.7)
MCV: 92 fL (ref 79–97)
Monocytes Absolute: 0.5 10*3/uL (ref 0.1–0.9)
Monocytes: 9 %
Neutrophils Absolute: 2.3 10*3/uL (ref 1.4–7.0)
Neutrophils: 45 %
Platelets: 243 10*3/uL (ref 150–450)
RBC: 4.85 x10E6/uL (ref 3.77–5.28)
RDW: 12.8 % (ref 11.7–15.4)
WBC: 5.1 10*3/uL (ref 3.4–10.8)

## 2021-12-01 LAB — LIPID PANEL
Chol/HDL Ratio: 3 ratio (ref 0.0–4.4)
Cholesterol, Total: 169 mg/dL (ref 100–199)
HDL: 56 mg/dL (ref >39)
LDL Chol Calc (NIH): 84 mg/dL (ref 0–99)
Triglycerides: 168 mg/dL — ABNORMAL HIGH (ref 0–149)
VLDL Cholesterol Cal: 29 mg/dL (ref 5–40)

## 2021-12-01 LAB — POCT GLYCOSYLATED HEMOGLOBIN (HGB A1C): Hemoglobin A1C: 6 % — AB (ref 4.0–5.6)

## 2021-12-06 ENCOUNTER — Other Ambulatory Visit: Payer: Self-pay

## 2021-12-06 ENCOUNTER — Other Ambulatory Visit (INDEPENDENT_AMBULATORY_CARE_PROVIDER_SITE_OTHER): Payer: Self-pay | Admitting: Primary Care

## 2021-12-06 DIAGNOSIS — E785 Hyperlipidemia, unspecified: Secondary | ICD-10-CM

## 2021-12-06 DIAGNOSIS — Z760 Encounter for issue of repeat prescription: Secondary | ICD-10-CM

## 2021-12-06 MED ORDER — PRAVASTATIN SODIUM 20 MG PO TABS
ORAL_TABLET | Freq: Every day | ORAL | 1 refills | Status: DC
  Filled 2021-12-06: qty 90, 90d supply, fill #0
  Filled 2022-01-26: qty 30, 30d supply, fill #0

## 2021-12-08 ENCOUNTER — Telehealth: Payer: Self-pay

## 2021-12-08 NOTE — Telephone Encounter (Signed)
Using Spanish interpreter [place] # [phone], pt. Given lab results and instructions. Verbalizes understanding.

## 2021-12-10 ENCOUNTER — Other Ambulatory Visit: Payer: Self-pay

## 2021-12-15 ENCOUNTER — Ambulatory Visit: Payer: Commercial Managed Care - HMO

## 2021-12-15 ENCOUNTER — Encounter (INDEPENDENT_AMBULATORY_CARE_PROVIDER_SITE_OTHER): Payer: Self-pay

## 2021-12-27 ENCOUNTER — Emergency Department (HOSPITAL_COMMUNITY)
Admission: EM | Admit: 2021-12-27 | Discharge: 2021-12-28 | Disposition: A | Discharge status: Home / Self Care | Payer: Commercial Managed Care - HMO | Source: Home / Self Care | Attending: Emergency Medicine | Admitting: Emergency Medicine

## 2021-12-27 ENCOUNTER — Encounter (HOSPITAL_COMMUNITY): Payer: Self-pay

## 2021-12-27 ENCOUNTER — Emergency Department (HOSPITAL_COMMUNITY): Payer: Commercial Managed Care - HMO

## 2021-12-27 ENCOUNTER — Other Ambulatory Visit: Payer: Self-pay

## 2021-12-27 ENCOUNTER — Emergency Department (HOSPITAL_COMMUNITY)
Admission: EM | Admit: 2021-12-27 | Discharge: 2021-12-27 | Discharge status: Against Medical Advice | Payer: Commercial Managed Care - HMO | Attending: Emergency Medicine | Admitting: Emergency Medicine

## 2021-12-27 DIAGNOSIS — R0781 Pleurodynia: Secondary | ICD-10-CM | POA: Insufficient documentation

## 2021-12-27 DIAGNOSIS — W11XXXA Fall on and from ladder, initial encounter: Secondary | ICD-10-CM | POA: Insufficient documentation

## 2021-12-27 DIAGNOSIS — R519 Headache, unspecified: Secondary | ICD-10-CM | POA: Insufficient documentation

## 2021-12-27 DIAGNOSIS — M533 Sacrococcygeal disorders, not elsewhere classified: Secondary | ICD-10-CM | POA: Insufficient documentation

## 2021-12-27 DIAGNOSIS — M545 Low back pain, unspecified: Secondary | ICD-10-CM | POA: Insufficient documentation

## 2021-12-27 DIAGNOSIS — S0990XA Unspecified injury of head, initial encounter: Secondary | ICD-10-CM | POA: Insufficient documentation

## 2021-12-27 DIAGNOSIS — M542 Cervicalgia: Secondary | ICD-10-CM | POA: Diagnosis not present

## 2021-12-27 DIAGNOSIS — Z5321 Procedure and treatment not carried out due to patient leaving prior to being seen by health care provider: Secondary | ICD-10-CM | POA: Insufficient documentation

## 2021-12-27 DIAGNOSIS — Y990 Civilian activity done for income or pay: Secondary | ICD-10-CM | POA: Insufficient documentation

## 2021-12-27 NOTE — ED Provider Triage Note (Signed)
Emergency Medicine Provider Triage Evaluation Note  Lori Morse , a 57 y.o. female  was evaluated in triage.  Pt complains of headache, right-sided neck pain, right-sided low back pain.  Ongoing since yesterday.  She fell yesterday as she was getting down from a counter and stepped on the ladder.  States headache started sometime yesterday.  Other symptoms started when she woke up today.  She was able to finish her shift yesterday.  Denies loss of consciousness.  She is without nausea, vomiting, visual change.  Review of Systems  Positive: As above Negative: As above  Physical Exam  BP 135/81 (BP Location: Right Arm)   Pulse 64   Temp 98.9 F (37.2 C)   Resp 14   Ht '5\' 2"'$  (1.575 m)   Wt 81.6 kg   LMP 09/02/2018 (Exact Date)   SpO2 100%   BMI 32.92 kg/m  Gen:   Awake, no distress   Resp:  Normal effort  MSK:   Moves extremities without difficulty  Other:  Without cervical, thoracic, lumbar spinal process tenderness.  Medical Decision Making  Medically screening exam initiated at 11:29 AM.  Appropriate orders placed.  Lori Morse was informed that the remainder of the evaluation will be completed by another provider, this initial triage assessment does not replace that evaluation, and the importance of remaining in the ED until their evaluation is complete.     Evlyn Courier, PA-C 12/27/21 1131

## 2021-12-27 NOTE — ED Triage Notes (Signed)
Reports was on a step ladder and fell backwards off ladder yesterday while at work.  COmplains of neck pain back pain right side pain with deep breathes.

## 2021-12-27 NOTE — ED Notes (Signed)
Nowhere to be found and pt not answering when being called

## 2021-12-27 NOTE — ED Triage Notes (Signed)
Pt reports low back pain, R neck pain, and R arm pain x 2 days since falling at work. An xray was taken earlier in this visit (pt did not answer from lobby and had to be added back for new visit).

## 2021-12-28 ENCOUNTER — Other Ambulatory Visit: Payer: Self-pay

## 2021-12-28 ENCOUNTER — Emergency Department (HOSPITAL_COMMUNITY): Payer: Commercial Managed Care - HMO

## 2021-12-28 LAB — PREGNANCY, URINE: Preg Test, Ur: NEGATIVE

## 2021-12-28 MED ORDER — IBUPROFEN 800 MG PO TABS
800.0000 mg | ORAL_TABLET | Freq: Once | ORAL | Status: DC
Start: 2021-12-28 — End: 2021-12-28

## 2021-12-28 MED ORDER — IBUPROFEN 600 MG PO TABS
600.0000 mg | ORAL_TABLET | Freq: Four times a day (QID) | ORAL | 0 refills | Status: DC | PRN
  Filled 2021-12-28: qty 30, 8d supply, fill #0

## 2021-12-28 MED ORDER — CYCLOBENZAPRINE HCL 10 MG PO TABS
10.0000 mg | ORAL_TABLET | Freq: Two times a day (BID) | ORAL | 0 refills | Status: DC | PRN
  Filled 2021-12-28: qty 20, 10d supply, fill #0

## 2021-12-28 NOTE — ED Provider Notes (Signed)
[facility] EMERGENCY DEPARTMENT Provider Note   CSN: [id number] Arrival date & time: 12/27/21  2041     History  Chief Complaint  Patient presents with   Back Pain    Lori Morse is a 56 y.o. female.  The history is provided by the patient. The history is limited by a language barrier. A language interpreter was used.  Back Pain    This is a 56 year old female, Spanish-speaking, presenting complaining of pain from a fall.  History obtained using language interpreter.  Patient reports she was at work 3 days ago standing on a step ladder approximately a foot off the ground, slipped fell backwards striking head but no loss of consciousness.  She endorsed having pain to the back of her head, to her left side of chest and hip and pelvic region from the fall.  Pain is sharp achy 8 out of 10 nonradiating worse with movement.  No chest pain no trouble breathing no abdominal pain.  She denies any numbness weakness.  She did take some ibuprofen initially with some improvement but none recently.  She also request for a work note.  She is not on any blood thinner medication.  Home Medications Prior to Admission medications   Medication Sig Start Date End Date Taking? Authorizing Provider  DULoxetine (CYMBALTA) 30 MG capsule TAKE 1 CAPSULE (30 MG TOTAL) BY MOUTH DAILY. Patient not taking: Reported on 11/30/2021 11/30/20 11/30/21  Kerin Perna, NP  pravastatin (PRAVACHOL) 20 MG tablet TAKE 1 TABLET (20 MG TOTAL) BY MOUTH DAILY. 12/06/21 12/06/22  Kerin Perna, NP      Allergies    Patient has no known allergies.    Review of Systems   Review of Systems  Musculoskeletal:  Positive for back pain.  All other systems reviewed and are negative.   Physical Exam Updated Vital Signs BP 120/87   Pulse 60   Temp 98.6 F (37 C)   Resp 17   LMP 09/02/2018 (Exact Date)   SpO2 98%  Physical Exam Vitals and nursing note reviewed.  Constitutional:      General:  She is not in acute distress.    Appearance: She is well-developed.  HENT:     Head: Normocephalic and atraumatic.     Comments: Mild tenderness to occipital scalp without any crepitus bruising or overlying skin changes. Eyes:     Conjunctiva/sclera: Conjunctivae normal.  Neck:     Comments: No cervical midline spine tenderness Pulmonary:     Effort: Pulmonary effort is normal.  Musculoskeletal:        General: Tenderness (Tenderness noted to lumbar sacral region warm without deformity.) present.     Cervical back: Normal range of motion and neck supple.  Skin:    Findings: No rash.  Neurological:     Mental Status: She is alert and oriented to person, place, and time.  Psychiatric:        Mood and Affect: Mood normal.     ED Results / Procedures / Treatments   Labs (all labs ordered are listed, but only abnormal results are displayed) Labs Reviewed  PREGNANCY, URINE    EKG None  Radiology DG Pelvis 1-2 Views  Result Date: 12/28/2021 CLINICAL DATA:  Anterior fifth and sixth left rib pain. EXAM: PELVIS - 1-2 VIEW COMPARISON:  None Available. FINDINGS: There is no evidence of pelvic fracture or diastasis. No pelvic bone lesions are seen. IMPRESSION: Negative. Electronically Signed   By: Virgina Norfolk  M.D.   On: 12/28/2021 00:58   DG Ribs Unilateral W/Chest Left  Result Date: 12/28/2021 CLINICAL DATA:  Anterior fifth and sixth left rib pain EXAM: LEFT RIBS AND CHEST - 3+ VIEW COMPARISON:  May 15, 2019 FINDINGS: No fracture or other bone lesions are seen involving the ribs. There is no evidence of pneumothorax or pleural effusion. Both lungs are clear. Heart size and mediastinal contours are within normal limits. IMPRESSION: Negative. Electronically Signed   By: Virgina Norfolk M.D.   On: 12/28/2021 00:57   CT Head Wo Contrast  Result Date: 12/27/2021 CLINICAL DATA:  Moderate to severe head trauma. [place] backwards on a step ladder yesterday while at work. Neck and  back pain. EXAM: CT HEAD WITHOUT CONTRAST TECHNIQUE: Contiguous axial images were obtained from the base of the skull through the vertex without intravenous contrast. RADIATION DOSE REDUCTION: This exam was performed according to the departmental dose-optimization program which includes automated exposure control, adjustment of the mA and/or kV according to patient size and/or use of iterative reconstruction technique. COMPARISON:  None Available. FINDINGS: Brain: The ventricles are normal in size and configuration. The basilar cisterns are patent. No mass, mass effect, or midline shift. No acute intracranial hemorrhage is seen. No abnormal extra-axial fluid collection. Preservation of the normal cortical gray-white interface without CT evidence of an acute major vascular territorial cortical based infarction. Vascular: No hyperdense vessel or unexpected calcification. Skull: Normal. Negative for fracture or focal lesion. Sinuses/Orbits: The visualized orbits are unremarkable. Mild peripheral right middle peripheral left frontal sinus mucosal thickening. Mild ethmoid air cell mucosal thickening. Minimal right sphenoid sinus peripheral mucosal thickening. The visualized mastoid air cells are clear. Other: None. IMPRESSION: No acute intracranial process. Electronically Signed   By: Yvonne Kendall M.D.   On: 12/27/2021 13:51    Procedures Procedures    Medications Ordered in ED Medications  ibuprofen (ADVIL) tablet 800 mg (has no administration in time range)    ED Course/ Medical Decision Making/ A&P                           Medical Decision Making Risk Prescription drug management.   BP 120/87   Pulse 60   Temp 98.6 F (37 C)   Resp 17   LMP 09/02/2018 (Exact Date)   SpO2 98%   7:15 AM This is a 56 year old Hispanic speaking female presenting for a fall.  She fell at work 3 days ago.  She was standing on a stepladder slipped and fell backward striking her head and left chest and pelvic  region from the fall.  She denies any loss of consciousness and was able to ambulate afterward.  She endorsed having pain to the back of her head her left chest and her pelvic region.  Rates the pain as 8 out of 10.  She does not endorse any focal numbness or focal weakness.  She did improve with some ibuprofen at home.  She is not on any blood thinner medication.  X-rays and CTs obtained independently viewed interpreted by me and I agree with radiologist interpretation.  Head CT scan without any concerning finding, x-ray of the left ribs as well as x-ray of the pelvis without any fracture.  Patient without concussive symptoms.  She is able to ambulate.  No other concerning injury noted on exam.  RICE therapy discussed.  Orthopedic referral given as needed.  Work note provided per request.  Return precaution discussed.  Ibuprofen  given with improvement of symptoms.  I recommend patient to take ibuprofen as needed for pain, I also prescribed muscle relaxant as needed for muscle spasm and to help with sleep.  I have considered fracture, dislocation, internal injury, intracranial injury in the differential.          Final Clinical Impression(s) / ED Diagnoses Final diagnoses:  Fall from ladder, initial encounter  Minor head injury, initial encounter    Rx / DC Orders ED Discharge Orders          Ordered    ibuprofen (ADVIL) 600 MG tablet  Every 6 hours PRN        12/28/21 0739    cyclobenzaprine (FLEXERIL) 10 MG tablet  2 times daily PRN        12/28/21 0739              Domenic Moras, PA-C 12/28/21 4818    Davonna Belling, MD 12/28/21 1510

## 2021-12-28 NOTE — ED Provider Triage Note (Signed)
Emergency Medicine Provider Triage Evaluation Note  Lori Morse , a 56 y.o. female  was evaluated in triage.  Pt complains of fall, occurred yesterday, states she fell off a ladder approximately 3 feet, landed on her back, no head trauma, not anticoag's, endorses a headache upon standing up after the fall, she then developed pain on her left lower ribs as well as her lower hips, she does not have any other complaints..  Review of Systems  Positive: Hip pain, rib pain Negative: Nausea vomiting diarrhea  Physical Exam  BP 104/72 (BP Location: Right Arm)   Pulse (!) 53   Temp 98.3 F (36.8 C) (Oral)   Resp 16   LMP 09/02/2018 (Exact Date)   SpO2 98%  Gen:   Awake, no distress   Resp:  Normal effort  MSK:   Moves extremities without difficulty  Other:    Medical Decision Making  Medically screening exam initiated at 12:03 AM.  Appropriate orders placed.  Lori Morse was informed that the remainder of the evaluation will be completed by another provider, this initial triage assessment does not replace that evaluation, and the importance of remaining in the ED until their evaluation is complete.  Imaging have been ordered will need further work-up.    Marcello Fennel, PA-C 12/28/21 0004

## 2021-12-29 ENCOUNTER — Other Ambulatory Visit: Payer: Self-pay

## 2021-12-30 ENCOUNTER — Ambulatory Visit (INDEPENDENT_AMBULATORY_CARE_PROVIDER_SITE_OTHER): Payer: Commercial Managed Care - HMO | Admitting: Primary Care

## 2021-12-30 ENCOUNTER — Encounter (INDEPENDENT_AMBULATORY_CARE_PROVIDER_SITE_OTHER): Payer: Self-pay | Admitting: Primary Care

## 2021-12-30 ENCOUNTER — Other Ambulatory Visit: Payer: Self-pay

## 2021-12-30 VITALS — BP 113/80 | HR 66 | Temp 98.1°F | Ht 62.0 in | Wt 179.2 lb

## 2021-12-30 DIAGNOSIS — M722 Plantar fascial fibromatosis: Secondary | ICD-10-CM | POA: Diagnosis not present

## 2021-12-30 DIAGNOSIS — F32A Depression, unspecified: Secondary | ICD-10-CM | POA: Diagnosis not present

## 2021-12-30 DIAGNOSIS — M549 Dorsalgia, unspecified: Secondary | ICD-10-CM

## 2021-12-30 DIAGNOSIS — Z760 Encounter for issue of repeat prescription: Secondary | ICD-10-CM | POA: Diagnosis not present

## 2021-12-30 MED ORDER — DULOXETINE HCL 30 MG PO CPEP
ORAL_CAPSULE | Freq: Every day | ORAL | 1 refills | Status: DC
  Filled 2021-12-30: qty 30, 30d supply, fill #0
  Filled 2022-01-26: qty 30, 30d supply, fill #1

## 2021-12-30 MED ORDER — IBUPROFEN 600 MG PO TABS
600.0000 mg | ORAL_TABLET | Freq: Four times a day (QID) | ORAL | 0 refills | Status: DC | PRN
  Filled 2021-12-30 – 2022-01-26 (×2): qty 90, 23d supply, fill #0

## 2021-12-30 MED ORDER — CYCLOBENZAPRINE HCL 10 MG PO TABS
10.0000 mg | ORAL_TABLET | Freq: Two times a day (BID) | ORAL | 0 refills | Status: DC | PRN
Start: 2021-12-30 — End: 2022-03-01
  Filled 2021-12-30 – 2022-01-26 (×2): qty 20, 10d supply, fill #0

## 2021-12-30 NOTE — Progress Notes (Signed)
Tignall, is a 56 y.o. female  YWV:[id number]  WNI:[id number]  DOB - 12-18-1965  Chief Complaint  Patient presents with   Back Pain       Subjective:   Lori Morse is a 56 y.o. female  Lori Morse [phone] here today for a follow up visit for back pain . Took a fall while at work([employer]) this pat Sunday 12/26/2021 she is now having back pain, arms and head. BACK PAIN . Location: T3-S Quality: burning, heaviness, sharp, throbbing, and uncomfortable Onset: worsening 12/26/2021 Aggravating factors: certain movements and prolonged walking/standing. Alleviating factors: rest. Progressive LE weakness or saddle anesthesia: none. Extremity sensation changes or weakness: none. Ambulatory without difficulty. Normal bowel/bladder habits: yes; without urinary retention. Normal PO intake without n/v. No associated abdominal pain/n/v. Self treatment: none on NSAIDs and anti spasmatic has OTC analgesics, with minimal relief.  Patient has No headache, No chest pain, No abdominal pain - No Nausea, No new weakness tingling or numbness, No Cough - shortness of breath Trauma: fall 12/26/21 Pt given work note in ED to return tomorrow 12/31/21 states she can not go back because she can not work  May need new referral placed Pt asked when she wants to return and states she can not go back until she feels well. No approximate date    No problems updated.  No Known Allergies  Past Medical History:  Diagnosis Date   Bilateral bunions    Depression    Hyperlipidemia    Left sided sciatica 03/2017   resolved   Plantar fasciitis, right    PTSD (post-traumatic stress disorder)    Pulmonary nodules 04/17/2018   Bilateral, noted on CXR    Current Outpatient Medications on File Prior to Visit  Medication Sig Dispense Refill   pravastatin (PRAVACHOL) 20 MG tablet TAKE 1 TABLET (20 MG TOTAL) BY MOUTH DAILY. (Patient not taking: Reported on 12/30/2021) 90 tablet 1   No  current facility-administered medications on file prior to visit.    Objective:   Vitals:   12/30/21 1050  BP: 113/80  Pulse: 66  Temp: 98.1 F (36.7 C)  TempSrc: Oral  SpO2: 99%  Weight: 179 lb 3.2 oz (81.3 kg)  Height: '5\' 2"'$  (1.575 m)    Exam General: mild  distress.head hurts on the top only with palpation  Eyes: Extraocular eye movements intact, pupils equal and round. Neck: Supple, limited pain with exam right to left movement Thyroid: No enlargement, mobile without fixation, no tenderness. Cardiovascular: Regular rhythm and rate, no murmur, normal radial pulses. Respiratory: Normal respiratory effort, clear to auscultation. Gastrointestinal: Normal pitch active bowel sounds, nontender abdomen without distention or appreciable hepatomegaly. Musculoskeletal: Normal muscle tone, tenderness trapezius muscle and T3-S1 on palpation  Skin: Appropriate warmth, no visible rash. Mental status: Alert, conversant, speech clear, thought logical, appropriate mood and affect, no hallucinations or delusions evident. Hematologic/lymphatic: No cervical adenopathy, no visible ecchymoses.  Data Review Lab Results  Component Value Date   HGBA1C 6.0 (A) 11/30/2021    Assessment & Plan  Lori Morse was seen today for back pain.  Diagnoses and all orders for this visit:  Other acute back pain S/p fall see HPI  ibuprofen (ADVIL) 600 MG tablet; Take 1 tablet (600 mg total) by mouth every 6 (six) hours as needed. -     cyclobenzaprine (FLEXERIL) 10 MG tablet; Take 1 tablet (10 mg total) by mouth 2 (two) times daily as needed for muscle spasms.  Depression, unspecified depression  type Stop taking Cymbalta explained prescribed for depression , anxiety and pain . Asked why she thought she would have to  Would like to take it   Other orders -     ibuprofen (ADVIL) 600 MG tablet; Take 1 tablet (600 mg total) by mouth every 6 (six) hours as needed. -     cyclobenzaprine (FLEXERIL) 10 MG tablet;  Take 1 tablet (10 mg total) by mouth 2 (two) times daily as needed for muscle spasms.    Referred to orthopedic surgery at [facility] - no referral in chart. ED discharge states to follow up with ortho surgery and provided MD name location and phone number  Patient have been counseled extensively about nutrition and exercise. Other issues discussed during this visit include: low cholesterol diet, weight control and daily exercise, foot care, annual eye examinations at Ophthalmology, importance of adherence with medications and regular follow-up. We also discussed long term complications of uncontrolled diabetes and hypertension.   Return in about 7 weeks (around 02/17/2022), or medication f/u.  The patient was given clear instructions to go to ER or return to medical center if symptoms don't improve, worsen or new problems develop. The patient verbalized understanding. The patient was told to call to get lab results if they haven't heard anything in the next week.   This note has been created with [employer]or, quantity. Any transcriptional errors are unintentional.   Kerin Perna, NP 12/30/2021, 11:22 PM

## 2021-12-30 NOTE — Patient Instructions (Addendum)
Orthopedic Surgery  Vanetta Mulders, MD  Contacto: [address] [place] [address] [phone] Duloxetine Delayed-Release Capsules Sander Nephew es este medicamento? [place] DULOXETINA se [place] para tratar la depresin, la ansiedad, la fibromialgia y ciertos tipos de Social research officer, government crnico tales como dolor neurolgico, seo o articular. Aumenta la cantidad de serotonina y norepinefrina en el cerebro, hormonas que ayudan a regular el estado de nimo y Conservation officer, historic buildings. Pertenece a un grupo de Actuary. Este medicamento puede ser utilizado para otros usos; si tiene alguna pregunta consulte con su proveedor de atencin mdica o con su farmacutico. MARCAS COMUNES: Cymbalta, Drizalma, Theador Hawthorne le debo informar a mi profesional de la salud antes de tomar este medicamento? Necesitan saber si usted presenta alguno de los siguientes problemas o situaciones: Trastorno bipolar Glaucoma Presin sangunea alta Enfermedad renal Enfermedad heptica Convulsiones Ideas, planes o intento de suicidio; si usted o alguien de su familia ha intentado suicidarse previamente Si [place] medicamentos que tratan o previenen cogulos sanguneos Si ha tomado medicamentos llamados IMAO, tales como [place], [place], [place], [place] y Parnate en los ltimos [place] para orinar Una reaccin inusual a la duloxetina, a otros medicamentos, alimentos, colorantes o conservantes Si est embarazada o buscando quedar embarazada Si est amamantando a un beb Cmo debo [employer]? Tome este medicamento por va oral con un vaso de agua. Siga las instrucciones de la etiqueta del [place]. No triture, corte ni mastique algunas cpsulas de Coca[employer]Cola. Algunas cpsulas pueden abrirse y el contenido puede espolvorearse sobre pur de [place]. Pida asesoramiento a su equipo de atencin o farmacutico si no est seguro. Puede tomar este medicamento con o sin alimentos. Use su medicamento a intervalos  regulares. No use su medicamento con una frecuencia mayor a la indicada. No deje de usar Coca[employer]a de repente a menos que as lo indique su equipo de atencin. Dejar de Risk manager este medicamento demasiado rpido puede causar efectos secundarios graves o podra empeorar su afeccin. Su farmacutico le dar una [place] del medicamento especial (MedGuide, nombre en ingls) con cada receta y en cada ocasin que la vuelva a surtir. Asegrese de leer esta informacin cada vez cuidadosamente. Hable con su equipo de atencin sobre el uso de este medicamento en nios. Aunque este medicamento se puede recetar a nios tan pequeos como de 7 aos de edad con ciertas afecciones, existen precauciones que deben tomarse. Sobredosis: Pngase en contacto inmediatamente con un centro toxicolgico o una sala de urgencia si usted cree que haya tomado demasiado medicamento. ATENCIN: [employer] es solo para usted. No comparta este medicamento con nadie. Qu sucede si me olvido de una dosis? Si olvida una dosis, tmela lo antes posible. Si es casi la hora de la prxima dosis, tome slo esa dosis. No tome dosis adicionales o dobles. Qu puede interactuar con este medicamento? No use este medicamento con ninguno de los siguientes productos: Desvenlafaxina Levomilnaciprn Linezolida IMAO, tales como [place], [place], [place], [place], [place] y Parnate Azul de metileno (inyectado en una vena) Milnaciprn Safinamida Tioridazina Venlafaxina Viloxazina Este medicamento tambin podra interactuar con los siguientes productos: Alcohol Anfetaminas Aspirina y otros medicamentos tipo aspirina Ciertos antibiticos, tales como ciprofloxacino y enoxacina Ciertos medicamentos para la presin arterial, enfermedad cardiaca y frecuencia cardiaca irregular Ciertos medicamentos para la depresin, ansiedad o trastornos psicticos Ciertos medicamentos para la migraa, tales como almotriptn, eletriptn, frovatriptn,  naratriptn, rizatriptn, sumatriptn, zolmitriptn Ciertos medicamentos que tratan o previenen cogulos sanguneos, tales como warfarina, enoxaparina y dalteparina Cimetidina Fentanilo Litio [place],  medicamentos para Conservation officer, historic buildings y la inflamacin, tales como ibuprofeno o naproxeno Fentermina Procarbazina Rasagilina Sibutramina Hierba de [place] Tramadol Triptfano Puede ser que esta lista no menciona todas las posibles interacciones. Informe a su profesional de [employer]	[place] de [employer] productos a base de hierbas, medicamentos de [place] o suplementos nutritivos que est tomando. Si usted fuma, consume bebidas alcohlicas o si utiliza drogas ilegales, indqueselo tambin a su profesional de [employer]	[place]. Algunas sustancias pueden interactuar con su medicamento. A qu debo estar atento al usar Coca-Cola? Si los sntomas no mejoran o si empeoran, consulte con su equipo de atencin. Visite a su equipo de atencin para que revise su evolucin peridicamente. Debido a que podran necesitarse varias semanas para ver los efectos completos de este medicamento, es importante que contine con el tratamiento segn las indicaciones de su equipo de atencin. Este medicamento podra causar reacciones graves en la piel. Pueden presentarse semanas a meses despus de comenzar a Environmental consultant. Contacte a su equipo de atencin de inmediato si nota que tiene fiebre o sntomas gripales con una erupcin. La erupcin puede ser roja o Phill Myron, y luego puede convertirse en ampollas o descamacin de la piel. O bien, es posible que observe una erupcin roja con hinchazn en la cara, los labios o los ganglios linfticos en el cuello o debajo de los brazos. Preste atencin para Product manager aparicin o el empeoramiento de la depresin o las ideas suicidas. Esto incluye cambios repentinos en el estado de nimo, comportamientos o pensamientos. Estos cambios pueden suceder en cualquier momento, pero son ms  frecuentes durante el inicio del tratamiento o despus de un cambio en la dosis. Llame a su equipo de atencin de inmediato si tiene estos pensamientos o empeora su depresin. Podran producirse episodios manacos en pacientes con trastorno bipolar que [employer]. Es necesario estar atento ante [employer] o comportamientos, tales como sentirse ansioso, agitado, con pnico, irritable, hostil, agresivo, impulsivo, muy inquieto, excitado en exceso e hiperactivo, o tener dificultad para dormir. Estos sntomas pueden suceder en cualquier momento, pero son ms frecuentes durante el inicio del tratamiento o despus de un cambio en la dosis. Llame a su equipo de atencin de inmediato si nota alguno de estos sntomas. Puede experimentar somnolencia o mareos. No conduzca, no utilice maquinaria ni haga nada que Associate Professor en estado de alerta hasta que sepa cmo le afecta este medicamento. No se siente ni se ponga de pie con rapidez, especialmente si es un paciente de edad avanzada. Esto reduce el riesgo de mareos o [employer]. El alcohol podra interferir con el efecto de este medicamento. Evite consumir bebidas alcohlicas. Este medicamento puede aumentar los niveles de Dispensing optician. El riesgo podra ser mayor en pacientes que ya tienen diabetes. Pregunte a su equipo de atencin qu puede hacer para disminuir su riesgo de diabetes mientras [place] este medicamento. Este medicamento puede causar un aumento de la presin sangunea. Este medicamento tambin puede causar una disminucin repentina de la presin sangunea, lo cual puede hacer que tenga la sensacin de que va a Garment/textile technologist y Garment/textile technologist la probabilidad de una cada. Estos efectos son ms comunes cuando comienza a Environmental consultant o cuando se aumenta la dosis, o durante el uso de otros medicamentos que pueden causar una disminucin repentina de la presin sangunea. Hable con su equipo de atencin para obtener instrucciones sobre  la monitorizacin de la presin sangunea mientras [place] este medicamento. Se le podra  secar la boca. Masticar chicle sin azcar o chupar caramelos duros, y beber agua en abundancia, puede ser de [place]. Si el problema no desaparece o es grave, consulte a su equipo de atencin. Qu efectos secundarios puedo tener al [employer] este medicamento? Efectos secundarios que debe informar a su equipo de atencin tan pronto como sea posible: Reacciones alrgicas: erupcin cutnea, comezn/picazn, urticaria, hinchazn de la cara, los labios, la lengua o la garganta Sangrado: heces con [place], o de color negro y aspecto alquitranado, orina de color rojo o marrn oscuro, vomitar sangre o material marrn que tiene el aspecto de posos (residuos) de caf, pequeas manchas rojas o moradas en la piel, sangrado o moretones inusuales Aumento de la presin arterial Lesin en el hgado: dolor en la regin abdominal superior derecha, prdida de apetito, nuseas, heces de color claro, orina amarilla oscura o marrn, color amarillento de los ojos o la piel, debilidad o fatiga inusuales Nivel bajo de sodio: debilidad muscular, fatiga, mareos, dolor de cabeza, confusin Enrojecimiento, formacin de ampollas, descamacin o distensin de la piel, incluso dentro de la boca Sndrome serotoninrgico (de la serotonina): irritabilidad, confusin, frecuencia cardiaca rpida o irregular, rigidez muscular, tics musculares, sudoracin, fiebre alta, convulsiones, escalofros, vmito, diarrea Dolor repentino en los ojos o cambio en la visin como visin borrosa, ver halos alrededor [employer], prdida de visin Ideas suicidas o de autolesionarse, empeoramiento del [place] de nimo, sensacin de depresin Dificultad para orinar Efectos secundarios que generalmente no requieren atencin mdica (debe informarlos a su equipo de atencin si persisten o si son molestos): Cambios en el deseo o desempeo  sexual Estreimiento Diarrea Chiropractor seca Sudoracin excesiva Prdida del apetito Nuseas Vmito Puede ser que esta lista no menciona todos los posibles efectos secundarios. Comunquese a su mdico por asesoramiento mdico [employer]. Usted puede informar los efectos secundarios a la FDA por telfono al 1-800-FDA-1088. Dnde debo guardar mi medicina? Mantenga fuera del alcance de nios y Copy. Guarde a [employer], entre 15 y 7 grados Celsius (36 y 59 grados Fahrenheit). Deseche todo el medicamento que no haya utilizado despus de la fecha de vencimiento. Para desechar los medicamentos que ya no necesite o que estn vencidos: Lleve el medicamento a un programa de recuperacin de medicamentos. Consulte con su farmacia o con una entidad reguladora para encontrar un lugar donde llevarlo. Si no puede devolver el medicamento, consulte la etiqueta o el folleto de informacin para ver si debe desecharlo en la basura o arrojarlo por el sanitario. Si no est seguro, consulte con su equipo de atencin. Si es seguro colocarlo en la basura, saque el medicamento del recipiente. Mezcle el medicamento con piedras sanitarias para gatos, tierra, posos (residuos) de caf u otro desperdicio. Coloque la [employer] bolsa o recipiente que quede [place]. Deseche en la basura. ATENCIN: Este folleto es un resumen. Puede ser que no cubra toda la posible informacin. Si usted tiene preguntas acerca de esta medicina, consulte con su mdico, su farmacutico o su profesional de Technical sales engineer.  2023 Elsevier/Gold Standard (2020-11-18 00:00:00)

## 2021-12-30 NOTE — Progress Notes (Signed)
Took a fall while at work([employer]) this pat Sunday 12/26/2021 she is now having back pain, arms, head  Pt given work note in ED to return tomorrow 12/31/21 states she can not go back because she can not work  Referred to orthopedic surgery at [place] - no referral in chart. ED discharge states to follow up with ortho surgery and provided MD name location and phone number May need new referral placed Pt asked when she wants to return and states she can not go back until she feels well. No approximate date

## 2022-01-06 ENCOUNTER — Ambulatory Visit (INDEPENDENT_AMBULATORY_CARE_PROVIDER_SITE_OTHER): Payer: Commercial Managed Care - HMO

## 2022-01-06 ENCOUNTER — Other Ambulatory Visit: Payer: Self-pay

## 2022-01-06 ENCOUNTER — Ambulatory Visit (INDEPENDENT_AMBULATORY_CARE_PROVIDER_SITE_OTHER): Payer: Commercial Managed Care - HMO | Admitting: Podiatry

## 2022-01-06 ENCOUNTER — Ambulatory Visit: Payer: Commercial Managed Care - HMO

## 2022-01-06 ENCOUNTER — Ambulatory Visit
Admission: RE | Admit: 2022-01-06 | Discharge: 2022-01-06 | Disposition: A | Discharge status: Home / Self Care | Payer: Commercial Managed Care - HMO | Source: Ambulatory Visit | Attending: Primary Care | Admitting: Primary Care

## 2022-01-06 DIAGNOSIS — M722 Plantar fascial fibromatosis: Secondary | ICD-10-CM

## 2022-01-06 DIAGNOSIS — R52 Pain, unspecified: Secondary | ICD-10-CM

## 2022-01-06 DIAGNOSIS — Z1231 Encounter for screening mammogram for malignant neoplasm of breast: Secondary | ICD-10-CM

## 2022-01-06 MED ORDER — DICLOFENAC SODIUM 75 MG PO TBEC
75.0000 mg | DELAYED_RELEASE_TABLET | Freq: Two times a day (BID) | ORAL | 1 refills | Status: AC
  Filled 2022-01-06 – 2022-01-26 (×2): qty 60, 30d supply, fill #0

## 2022-01-06 NOTE — Patient Instructions (Signed)

## 2022-01-10 LAB — COLOGUARD: COLOGUARD: NEGATIVE

## 2022-01-10 NOTE — Progress Notes (Signed)
  Subjective:  Patient ID: Lori Morse, female    DOB: 08/14/1965,  MRN: [id number]  Chief Complaint  Patient presents with   Plantar Fasciitis    Plantar fasciitis of right foot    56 y.o. female presents with the above complaint. History confirmed with patient.  This is a new problem for her she has not had this before.  Most the pain is in the bottom of the heel.  Gets worse when she gets up first in the morning  Objective:  Physical Exam: warm, good capillary refill, no trophic changes or ulcerative lesions, normal DP and PT pulses, normal sensory exam, and pain on palpation to plantar right heel and medial band of plantar fascia.  Radiographs: Multiple views x-ray of the right foot: Small plantar calcaneal enthesophyte noted Assessment:   1. New complaint of pain   2. Plantar fasciitis of right foot      Plan:  Patient was evaluated and treated and all questions answered.  Discussed the etiology and treatment options for plantar fasciitis including stretching, formal physical therapy, supportive shoegears such as a running shoe or sneaker, pre fabricated orthoses, injection therapy, and oral medications. We also discussed the role of surgical treatment of this for patients who do not improve after exhausting non-surgical treatment options.   -XR reviewed with patient -Educated patient on stretching and icing of the affected limb -Injection delivered to the plantar fascia of the right foot. -Rx for Voltaren 75 mg twice daily. Educated on use, risks and benefits of the medication  After sterile prep with povidone-iodine solution and alcohol, the right heel was injected with 0.5cc 2% xylocaine plain, 0.5cc 0.5% marcaine plain, '5mg'$  triamcinolone acetonide, and '2mg'$  dexamethasone was injected along the medial plantar fascia at the insertion on the plantar calcaneus. The patient tolerated the procedure well without complication.  Return in about 6 weeks (around 02/17/2022)  for recheck plantar fasciitis.

## 2022-01-12 ENCOUNTER — Other Ambulatory Visit: Payer: Self-pay

## 2022-01-12 ENCOUNTER — Telehealth (INDEPENDENT_AMBULATORY_CARE_PROVIDER_SITE_OTHER): Payer: Self-pay | Admitting: Primary Care

## 2022-01-12 NOTE — Telephone Encounter (Signed)
Copied from Ryan [id number]. Topic: General - Other >> Jan 12, 2022  2:49 PM Oley Balm E wrote: Reason for CRM: Pt called requesting for a doctor's note that excuses her from work until further notice.  Best contact: [phone]

## 2022-01-13 NOTE — Telephone Encounter (Signed)
Returned pt call to see why she is needing a doctor note. Pt didn't answer lvm

## 2022-01-17 NOTE — Telephone Encounter (Signed)
Tried contacting pt to see why she is needing a doctor note pt didn't answer lvm

## 2022-01-26 ENCOUNTER — Other Ambulatory Visit: Payer: Self-pay

## 2022-02-17 ENCOUNTER — Ambulatory Visit (INDEPENDENT_AMBULATORY_CARE_PROVIDER_SITE_OTHER): Payer: Commercial Managed Care - HMO | Admitting: Podiatry

## 2022-02-17 DIAGNOSIS — M722 Plantar fascial fibromatosis: Secondary | ICD-10-CM

## 2022-02-17 NOTE — Patient Instructions (Signed)
Look for [employer] (shoe inserts) on [employer] or on [employer]. Wear these in your shoes

## 2022-02-18 NOTE — Progress Notes (Signed)
  Subjective:  Patient ID: Lori Morse, female    DOB: 10-Sep-1965,  MRN: [id number]  Chief Complaint  Patient presents with   Follow-up    6 week follow up for recheck plantar fasciitis.Patient stated she is doing well and pain is better. No complaints a this time. Patient is taking Voltaren twice a day.     56 y.o. female returns for follow-up with the above complaint. History confirmed with patient.  Overall doing much better  Objective:  Physical Exam: warm, good capillary refill, no trophic changes or ulcerative lesions, normal DP and PT pulses, normal sensory exam, and she has no pain on palpation to plantar right heel and medial band of plantar fascia.  Radiographs: Multiple views x-ray of the right foot: Small plantar calcaneal enthesophyte noted Assessment:   1. Plantar fasciitis of right foot       Plan:  Patient was evaluated and treated and all questions answered.  Discussed the etiology and treatment options for plantar fasciitis including stretching, formal physical therapy, supportive shoegears such as a running shoe or sneaker, pre fabricated orthoses, injection therapy, and oral medications. We also discussed the role of surgical treatment of this for patients who do not improve after exhausting non-surgical treatment options.   Overall significantly improved.  She will continue her home physical therapy plan and take the Voltaren p.o. as needed.  Return if symptoms worsen or fail to improve.

## 2022-03-01 ENCOUNTER — Encounter (INDEPENDENT_AMBULATORY_CARE_PROVIDER_SITE_OTHER): Payer: Self-pay | Admitting: Primary Care

## 2022-03-01 ENCOUNTER — Ambulatory Visit (INDEPENDENT_AMBULATORY_CARE_PROVIDER_SITE_OTHER): Payer: Commercial Managed Care - HMO | Admitting: Primary Care

## 2022-03-01 ENCOUNTER — Other Ambulatory Visit: Payer: Self-pay

## 2022-03-01 VITALS — BP 131/86 | HR 60 | Resp 16 | Wt 182.2 lb

## 2022-03-01 DIAGNOSIS — F32A Depression, unspecified: Secondary | ICD-10-CM

## 2022-03-01 DIAGNOSIS — E669 Obesity, unspecified: Secondary | ICD-10-CM

## 2022-03-01 DIAGNOSIS — M25562 Pain in left knee: Secondary | ICD-10-CM

## 2022-03-01 DIAGNOSIS — M25561 Pain in right knee: Secondary | ICD-10-CM | POA: Diagnosis not present

## 2022-03-01 DIAGNOSIS — M549 Dorsalgia, unspecified: Secondary | ICD-10-CM | POA: Diagnosis not present

## 2022-03-01 DIAGNOSIS — M722 Plantar fascial fibromatosis: Secondary | ICD-10-CM | POA: Diagnosis not present

## 2022-03-01 DIAGNOSIS — G8929 Other chronic pain: Secondary | ICD-10-CM

## 2022-03-01 DIAGNOSIS — Z760 Encounter for issue of repeat prescription: Secondary | ICD-10-CM

## 2022-03-01 DIAGNOSIS — E785 Hyperlipidemia, unspecified: Secondary | ICD-10-CM

## 2022-03-01 DIAGNOSIS — Z6833 Body mass index (BMI) 33.0-33.9, adult: Secondary | ICD-10-CM

## 2022-03-01 MED ORDER — PRAVASTATIN SODIUM 20 MG PO TABS
20.0000 mg | ORAL_TABLET | Freq: Every day | ORAL | 1 refills | Status: DC
  Filled 2022-03-01: qty 30, 30d supply, fill #0
  Filled 2022-03-10: qty 90, 90d supply, fill #0
  Filled 2022-12-30: qty 90, 90d supply, fill #1

## 2022-03-01 MED ORDER — DULOXETINE HCL 30 MG PO CPEP
30.0000 mg | ORAL_CAPSULE | Freq: Every day | ORAL | 1 refills | Status: DC
  Filled 2022-03-01: qty 30, 30d supply, fill #0
  Filled 2022-03-10: qty 90, 90d supply, fill #0
  Filled 2022-12-30: qty 90, 90d supply, fill #1

## 2022-03-01 MED ORDER — CYCLOBENZAPRINE HCL 10 MG PO TABS
10.0000 mg | ORAL_TABLET | Freq: Two times a day (BID) | ORAL | 0 refills | Status: DC | PRN
  Filled 2022-03-01: qty 60, 30d supply, fill #0
  Filled 2022-03-10: qty 90, 45d supply, fill #0

## 2022-03-01 NOTE — Patient Instructions (Signed)
Calorie Counting for Weight Loss Calories are units of energy. Your body needs a certain number of calories from food to keep going throughout the day. When you eat or drink more calories than your body needs, your body stores the extra calories mostly as fat. When you eat or drink fewer calories than your body needs, your body burns fat to get the energy it needs. Calorie counting means keeping track of how many calories you eat and drink each day. Calorie counting can be helpful if you need to lose weight. If you eat fewer calories than your body needs, you should lose weight. Ask your health care provider what a healthy weight is for you. For calorie counting to work, you will need to eat the right number of calories each day to lose a healthy amount of weight per week. A dietitian can help you figure out how many calories you need in a day and will suggest ways to reach your calorie goal. A healthy amount of weight to lose each week is usually 1-2 lb (0.5-0.9 kg). This usually means that your daily calorie intake should be reduced by 500-750 calories. Eating 1,200-1,500 calories a day can help most women lose weight. Eating 1,500-1,800 calories a day can help most men lose weight. What do I need to know about calorie counting? Work with your health care provider or dietitian to determine how many calories you should get each day. To meet your daily calorie goal, you will need to: Find out how many calories are in each food that you would like to eat. Try to do this before you eat. Decide how much of the food you plan to eat. Keep a food log. Do this by writing down what you ate and how many calories it had. To successfully lose weight, it is important to balance calorie counting with a healthy lifestyle that includes regular activity. Where do I find calorie information?  The number of calories in a food can be found on a Nutrition Facts label. If a food does not have a Nutrition Facts label, try  to look up the calories online or ask your dietitian for help. Remember that calories are listed per serving. If you choose to have more than one serving of a food, you will have to multiply the calories per serving by the number of servings you plan to eat. For example, the label on a package of bread might say that a serving size is 1 slice and that there are 90 calories in a serving. If you eat 1 slice, you will have eaten 90 calories. If you eat 2 slices, you will have eaten 180 calories. How do I keep a food log? After each time that you eat, record the following in your food log as soon as possible: What you ate. Be sure to include toppings, sauces, and other extras on the food. How much you ate. This can be measured in cups, ounces, or number of items. How many calories were in each food and drink. The total number of calories in the food you ate. Keep your food log near you, such as in a pocket-sized notebook or on an app or website on your mobile phone. Some programs will calculate calories for you and show you how many calories you have left to meet your daily goal. What are some portion-control tips? Know how many calories are in a serving. This will help you know how many servings you can have of a certain   food. Use a measuring cup to measure serving sizes. You could also try weighing out portions on a kitchen scale. With time, you will be able to estimate serving sizes for some foods. Take time to put servings of different foods on your favorite plates or in your favorite bowls and cups so you know what a serving looks like. Try not to eat straight from a food's packaging, such as from a bag or box. Eating straight from the package makes it hard to see how much you are eating and can lead to overeating. Put the amount you would like to eat in a cup or on a plate to make sure you are eating the right portion. Use smaller plates, glasses, and bowls for smaller portions and to prevent  overeating. Try not to multitask. For example, avoid watching TV or using your computer while eating. If it is time to eat, sit down at a table and enjoy your food. This will help you recognize when you are full. It will also help you be more mindful of what and how much you are eating. What are tips for following this plan? Reading food labels Check the calorie count compared with the serving size. The serving size may be smaller than what you are used to eating. Check the source of the calories. Try to choose foods that are high in protein, fiber, and vitamins, and low in saturated fat, trans fat, and sodium. Shopping Read nutrition labels while you shop. This will help you make healthy decisions about which foods to buy. Pay attention to nutrition labels for low-fat or fat-free foods. These foods sometimes have the same number of calories or more calories than the full-fat versions. They also often have added sugar, starch, or salt to make up for flavor that was removed with the fat. Make a grocery list of lower-calorie foods and stick to it. Cooking Try to cook your favorite foods in a healthier way. For example, try baking instead of frying. Use low-fat dairy products. Meal planning Use more fruits and vegetables. One-half of your plate should be fruits and vegetables. Include lean proteins, such as chicken, turkey, and fish. Lifestyle Each week, aim to do one of the following: 150 minutes of moderate exercise, such as walking. 75 minutes of vigorous exercise, such as running. General information Know how many calories are in the foods you eat most often. This will help you calculate calorie counts faster. Find a way of tracking calories that works for you. Get creative. Try different apps or programs if writing down calories does not work for you. What foods should I eat?  Eat nutritious foods. It is better to have a nutritious, high-calorie food, such as an avocado, than a food with  few nutrients, such as a bag of potato chips. Use your calories on foods and drinks that will fill you up and will not leave you hungry soon after eating. Examples of foods that fill you up are nuts and nut butters, vegetables, lean proteins, and high-fiber foods such as whole grains. High-fiber foods are foods with more than 5 g of fiber per serving. Pay attention to calories in drinks. Low-calorie drinks include water and unsweetened drinks. The items listed above may not be a complete list of foods and beverages you can eat. Contact a dietitian for more information. What foods should I limit? Limit foods or drinks that are not good sources of vitamins, minerals, or protein or that are high in unhealthy fats. These   include: Candy. Other sweets. Sodas, specialty coffee drinks, alcohol, and juice. The items listed above may not be a complete list of foods and beverages you should avoid. Contact a dietitian for more information. How do I count calories when eating out? Pay attention to portions. Often, portions are much larger when eating out. Try these tips to keep portions smaller: Consider sharing a meal instead of getting your own. If you get your own meal, eat only half of it. Before you start eating, ask for a container and put half of your meal into it. When available, consider ordering smaller portions from the menu instead of full portions. Pay attention to your food and drink choices. Knowing the way food is cooked and what is included with the meal can help you eat fewer calories. If calories are listed on the menu, choose the lower-calorie options. Choose dishes that include vegetables, fruits, whole grains, low-fat dairy products, and lean proteins. Choose items that are boiled, broiled, grilled, or steamed. Avoid items that are buttered, battered, fried, or served with cream sauce. Items labeled as crispy are usually fried, unless stated otherwise. Choose water, low-fat milk,  unsweetened iced tea, or other drinks without added sugar. If you want an alcoholic beverage, choose a lower-calorie option, such as a glass of wine or light beer. Ask for dressings, sauces, and syrups on the side. These are usually high in calories, so you should limit the amount you eat. If you want a salad, choose a garden salad and ask for grilled meats. Avoid extra toppings such as bacon, cheese, or fried items. Ask for the dressing on the side, or ask for olive oil and vinegar or lemon to use as dressing. Estimate how many servings of a food you are given. Knowing serving sizes will help you be aware of how much food you are eating at restaurants. Where to find more information Centers for Disease Control and Prevention: www.cdc.gov U.[place]. Department of Agriculture: myplate.gov Summary Calorie counting means keeping track of how many calories you eat and drink each day. If you eat fewer calories than your body needs, you should lose weight. A healthy amount of weight to lose per week is usually 1-2 lb (0.5-0.9 kg). This usually means reducing your daily calorie intake by 500-750 calories. The number of calories in a food can be found on a Nutrition Facts label. If a food does not have a Nutrition Facts label, try to look up the calories online or ask your dietitian for help. Use smaller plates, glasses, and bowls for smaller portions and to prevent overeating. Use your calories on foods and drinks that will fill you up and not leave you hungry shortly after a meal. This information is not intended to replace advice given to you by your health care provider. Make sure you discuss any questions you have with your health care provider. Document Revised: 05/30/2019 Document Reviewed: 05/30/2019 Elsevier Patient Education  2023 Elsevier Inc.  

## 2022-03-01 NOTE — Progress Notes (Signed)
[facility]  Subjective: CC: back pain PCP: Kerin Perna, NP HPI: Patient is a 56 y.o. female presenting to clinic today for back pain. Concerns today include:  1. Back Pain Patient reports that pain began fall at work .  No h/o back pain.  Pain is a 7/10.  It does radiate.  Bending, lifting  worsens pain.  Medication  improves pain.  Patient has been taking NSAIdS and muscle relaxer  for pain with good relief.  Patient has trauma or injury.  No dysuria, hematuria, fevers, chills, nausea, vomiting, abdominal pain, renal stones. Aggravating factors: certain movements and prolonged walking/standing. Alleviating factors: rest. Progressive LE weakness or saddle anesthesia: none. Extremity sensation changes or weakness: none. Ambulatory without difficulty. Normal bowel/bladder habits: yes; without urinary retention. Normal PO intake without n/v. No associated abdominal pain/n/v. Self treatment: has OTC analgesics, with minimal relief. Patient denies: urinary retention/incontinence, bowel incontinence, weakness, falls, sensation changes or pain anywhere else. No h/o back surgeries.    Current Outpatient Medications:    cyclobenzaprine (FLEXERIL) 10 MG tablet, Take 1 tablet (10 mg total) by mouth 2 (two) times daily as needed for muscle spasms., Disp: 90 tablet, Rfl: 0   diclofenac (VOLTAREN) 75 MG EC tablet, Take 1 tablet (75 mg total) by mouth 2 (two) times daily., Disp: 60 tablet, Rfl: 1   DULoxetine (CYMBALTA) 30 MG capsule, TAKE 1 CAPSULE (30 MG TOTAL) BY MOUTH DAILY., Disp: 90 capsule, Rfl: 1   ibuprofen (ADVIL) 600 MG tablet, Take 1 tablet (600 mg total) by mouth every 6 (six) hours as needed., Disp: 90 tablet, Rfl: 0   pravastatin (PRAVACHOL) 20 MG tablet, Take 1 tablet (20 mg total) by mouth daily., Disp: 90 tablet, Rfl: 1 No Known Allergies  Past Medical History:  Diagnosis Date   Bilateral bunions    Depression    Hyperlipidemia    Left sided sciatica 03/2017    resolved   Plantar fasciitis, right    PTSD (post-traumatic stress disorder)    Pulmonary nodules 04/17/2018   Bilateral, noted on CXR   Social History   Socioeconomic History   Marital status: Single    Spouse name: Not on file   Number of children: Not on file   Years of education: Not on file   Highest education level: Not on file  Occupational History   Not on file  Tobacco Use   Smoking status: Never   Smokeless tobacco: Never  Vaping Use   Vaping Use: Never used  Substance and Sexual Activity   Alcohol use: Never   Drug use: Never   Sexual activity: Yes    Birth control/protection: Post-menopausal  Other Topics Concern   Not on file  Social History Narrative   Not on file   Social Determinants of Health   Financial Resource Strain: Not on file  Food Insecurity: Not on file  Transportation Needs: Not on file  Physical Activity: Not on file  Stress: Not on file  Social Connections: Not on file  Intimate Partner Violence: Not on file   Past Surgical History:  Procedure Laterality Date   BUNIONECTOMY Left 05/16/2019   Procedure: [place]amese [place] WITH METATARSAL OSTEOTOMY;  Surgeon: [name], DPM;  Location: [place];  Service: Podiatry;  Laterality: Left;   BUNIONECTOMY Right 11/22/2019   Procedure: Pauline Good;  Surgeon: Criselda Peaches, DPM;  Location: [place];  Service: Podiatry;  Laterality: Right;  IV SEDATION   HALLUX VALGUS  AUSTIN Right 05/17/2018   Procedure: HALLUX VALGUS [place]UST[place] RIGHT FOOT;  Surgeon: [name], DPM;  Location: [place];  Service: Podiatry;  Laterality: Right;   HAMMER TOE SURGERY Right 11/22/2019   Procedure: HAMMER TOE REPAIR SECOND, THIRD,  FOURTH AND FIFTH TOES RIGHT FOOT;  Surgeon: Criselda Peaches, DPM;  Location: [place];  Service: Podiatry;  Laterality: Right;  IV SEDATION   PLANTAR FASCIA RELEASE Right 05/17/2018   Procedure: PLANTAR  FASCIOTOMY RIGHT FOOT;  Surgeon: [name], DPM;  Location: [place];  Service: Podiatry;  Laterality: Right;   Steroid Injections     foot    ROS: per HPI  Objective: Office vital signs reviewed. BP 131/86   Pulse 60   Resp 16   Wt 182 lb 3.2 oz (82.6 kg)   LMP 09/02/2018 (Exact Date)   SpO2 100%   BMI 33.32 kg/m   Physical Examination:  General: No apparent distress. Eyes: Extraocular eye movements intact, pupils equal and round. Neck: Supple, trachea midline. Thyroid: No enlargement, mobile without fixation, no tenderness. Cardiovascular: Regular rhythm and rate, no murmur, normal radial pulses. Respiratory: Normal respiratory effort, clear to auscultation. Gastrointestinal: Normal pitch active bowel sounds, nontender abdomen without distention or appreciable hepatomegaly. Musculoskeletal: Normal muscle tone, no tenderness on palpation of tibia, no excessive thoracic kyphosis. Skin: Appropriate warmth, no visible rash. Mental status: Alert, conversant, speech clear, thought logical, appropriate mood and affect, no hallucinations or delusions evident. Hematologic/lymphatic: No cervical adenopathy, no visible ecchymoses.   Assessment/ Plan: Laneya was seen today for back pain and knee pain.  Diagnoses and all orders for this visit: Altheria was seen today for back pain and knee pain.  Diagnoses and all orders for this visit:  Acute back pain greater than 3 months duration -     cyclobenzaprine (FLEXERIL) 10 MG tablet; Take 1 tablet (10 mg total) by mouth 2 (two) times daily as needed for muscle spasms.  Obesity (BMI 30.0-34.9) Obesity is 30-39 indicating an excess in caloric intake or underlining conditions. This may lead to other co-morbidities. Educated on lifestyle modifications of diet and exercise which may reduce obesity.    Acute pain of both knees -     cyclobenzaprine (FLEXERIL) 10 MG tablet; Take 1 tablet (10 mg total) by mouth 2 (two) times  daily as needed for muscle spasms.  Return in about 3 months (around 06/01/2022) for fasting /depression.   The above assessment and management plan was discussed with the patient. The patient verbalized understanding of and has agreed to the management plan. Patient is aware to call the clinic if symptoms persist or worsen. Patient is aware when to return to the clinic for a follow-up visit. Patient educated on when it is appropriate to go to the emergency department.   This note has been created with Surveyor, quantity. Any transcriptional errors are unintentional.   Kerin Perna, NP 03/01/2022, 2:24 PM

## 2022-03-08 ENCOUNTER — Other Ambulatory Visit: Payer: Self-pay

## 2022-03-10 ENCOUNTER — Ambulatory Visit (INDEPENDENT_AMBULATORY_CARE_PROVIDER_SITE_OTHER): Payer: Commercial Managed Care - HMO | Admitting: Surgery

## 2022-03-10 ENCOUNTER — Ambulatory Visit: Payer: Self-pay

## 2022-03-10 ENCOUNTER — Other Ambulatory Visit (HOSPITAL_COMMUNITY): Payer: Self-pay

## 2022-03-10 ENCOUNTER — Encounter: Payer: Self-pay | Admitting: Surgery

## 2022-03-10 VITALS — BP 127/80 | HR 57 | Ht 62.0 in | Wt 182.2 lb

## 2022-03-10 DIAGNOSIS — Y990 Civilian activity done for income or pay: Secondary | ICD-10-CM | POA: Diagnosis not present

## 2022-03-10 DIAGNOSIS — M5442 Lumbago with sciatica, left side: Secondary | ICD-10-CM | POA: Diagnosis not present

## 2022-03-10 NOTE — Progress Notes (Addendum)
Office Visit Note   Patient: Lori Morse           Date of Birth: 03/23/1966           MRN: [id number] Visit Date: 03/10/2022              Requested by: Kerin Perna, NP [address] [place],  [place] [address] PCP: Kerin Perna, NP   Assessment & Plan: Visit Diagnoses:  1. Acute left-sided low back pain with left-sided sciatica   2. Work related injury     Plan: At this point we will schedule patient for formal PT.  Follow-up with Dr. Lorin Mercy in 4 weeks for recheck.  She may need lumbar MRI but he can discuss that at next visit.  States that she has been working so she can continue to do so if she is able.  She will also continue conservative management with oral NSAID that she has been doing.  Follow-Up Instructions: Return in about 4 weeks (around 04/07/2022) for WITH DR Anderson RECHECK LUMBAR .   Orders:  Orders Placed This Encounter  Procedures   XR Lumbar Spine 2-3 Views   Ambulatory referral to Physical Therapy   No orders of the defined types were placed in this encounter.     Procedures: No procedures performed   Clinical Data: No additional findings.   Subjective: Chief Complaint  Patient presents with   Lower Back - Pain    HPI 56 year old Hispanic female who is a new patient to the clinic comes in with complaints of back pain.  Comes in with an interpreter.  Patient is employed at [employer] and reports to me that she suffered a work-related injury December 26, 2021.  I reviewed the ED notes from December 27, 2021 and they documented "patient reports she was at work 3 days ago standing on a stepladder approximately a foot off the ground, slipped fell backwards striking head".  Patient states that she was standing on a stool and there was ice, water and grease on the floor when the stool slipped and she fell backwards.  States that the [employer] shift supervisor immediately after the accident told her that she needed to be evaluated by a  healthcare provider by the next day.  Says that a coworker took her home.  Says that she "did not feel too bad" after the fall.  Per the documentation she was not seen in the emergency department until December 27, 2021.  In the emergency department no imaging studies were done of the lumbar spine.  ED physician put in their note that she was supposed to see Dr. Sammuel Hines with orthopedics.  Patient eventually followed up with primary care provider December 30, 2021 and again it was mention that patient was supposed to see Dr. Sammuel Hines at [facility] but this is the first that patient has seen orthopedic specialist since her injury.  Patient does have an attorney for her work-related injury and Worker's Comp. denied her claim.  Patient states that she has continued to work since 1 week after the injury.  Has tried conservative treatment with ibuprofen, diclofenac.  Pain in the left side of her back and aggravated when she is walking, sitting and lying down.  Has had intermittent episodes of numbness and tingling in her toes. Review of Systems No current complaints of cardiopulmonary GI/GU issues  Objective: Vital Signs: BP 127/80   Pulse (!) 57   Ht '5\' 2"'$  (1.575 m)   Wt  182 lb 3.2 oz (82.6 kg)   LMP 09/02/2018 (Exact Date)   BMI 33.32 kg/m   Physical Exam  Ortho Exam  Specialty Comments:  No specialty comments available.  Imaging: No results found.   PMFS History: Patient Active Problem List   Diagnosis Date Noted   Bilateral bunions 08/28/2018   Past Medical History:  Diagnosis Date   Bilateral bunions    Depression    Hyperlipidemia    Left sided sciatica 03/2017   resolved   Plantar fasciitis, right    PTSD (post-traumatic stress disorder)    Pulmonary nodules 04/17/2018   Bilateral, noted on CXR    History reviewed. No pertinent family history.  Past Surgical History:  Procedure Laterality Date   BUNIONECTOMY Left 05/16/2019   Procedure: Altamese [place] WITH  METATARSAL OSTEOTOMY;  Surgeon: [name], DPM;  Location: [place];  Service: Podiatry;  Laterality: Left;   BUNIONECTOMY Right 11/22/2019   Procedure: Pauline Good;  Surgeon: Criselda Peaches, DPM;  Location: [place];  Service: Podiatry;  Laterality: Right;  IV SEDATION   HALLUX VALGUS AUSTIN Right 05/17/2018   Procedure: HALLUX VALGUS [place] RIGHT FOOT;  Surgeon: [name], DPM;  Location: [place];  Service: Podiatry;  Laterality: Right;   HAMMER TOE SURGERY Right 11/22/2019   Procedure: HAMMER TOE REPAIR SECOND, THIRD,  FOURTH AND FIFTH TOES RIGHT FOOT;  Surgeon: Criselda Peaches, DPM;  Location: [place];  Service: Podiatry;  Laterality: Right;  IV SEDATION   PLANTAR FASCIA RELEASE Right 05/17/2018   Procedure: PLANTAR FASCIOTOMY RIGHT FOOT;  Surgeon: [name], DPM;  Location: [place];  Service: Podiatry;  Laterality: Right;   Steroid Injections     foot   Social History   Occupational History   Not on file  Tobacco Use   Smoking status: Never   Smokeless tobacco: Never  Vaping Use   Vaping Use: Never used  Substance and Sexual Activity   Alcohol use: Never   Drug use: Never   Sexual activity: Yes    Birth control/protection: Post-menopausal

## 2022-03-21 ENCOUNTER — Ambulatory Visit: Payer: Commercial Managed Care - HMO | Admitting: Rehabilitative and Restorative Service Providers"

## 2022-03-21 NOTE — Therapy (Signed)
OUTPATIENT PHYSICAL THERAPY THORACOLUMBAR EVALUATION   Patient Name: Lori Morse MRN: [id number] DOB:1966/04/17, 56 y.o., female Today's Date: 03/21/2022  END OF SESSION:   Past Medical History:  Diagnosis Date   Bilateral bunions    Depression    Hyperlipidemia    Left sided sciatica 03/2017   resolved   Plantar fasciitis, right    PTSD (post-traumatic stress disorder)    Pulmonary nodules 04/17/2018   Bilateral, noted on CXR   Past Surgical History:  Procedure Laterality Date   BUNIONECTOMY Left 05/16/2019   Procedure: Altamese [place] WITH METATARSAL OSTEOTOMY;  Surgeon: [name], DPM;  Location: [place];  Service: Podiatry;  Laterality: Left;   BUNIONECTOMY Right 11/22/2019   Procedure: Pauline Good;  Surgeon: Criselda Peaches, DPM;  Location: [place];  Service: Podiatry;  Laterality: Right;  IV SEDATION   HALLUX VALGUS AUSTIN Right 05/17/2018   Procedure: HALLUX VALGUS AUSTIN RIGHT FOOT;  Surgeon: [name], DPM;  Location: [place];  Service: Podiatry;  Laterality: Right;   HAMMER TOE SURGERY Right 11/22/2019   Procedure: HAMMER TOE REPAIR SECOND, THIRD,  FOURTH AND FIFTH TOES RIGHT FOOT;  Surgeon: Criselda Peaches, DPM;  Location: [place];  Service: Podiatry;  Laterality: Right;  IV SEDATION   PLANTAR FASCIA RELEASE Right 05/17/2018   Procedure: PLANTAR FASCIOTOMY RIGHT FOOT;  Surgeon: [name], DPM;  Location: [place];  Service: Podiatry;  Laterality: Right;   Steroid Injections     foot   Patient Active Problem List   Diagnosis Date Noted   Bilateral bunions 08/28/2018    PCP: Kerin Perna NP  REFERRING PROVIDER: Lanae Crumbly, PA-C  REFERRING DIAG: [phone] (ICD-10-CM) - Acute left-sided low back pain with left-sided sciatica Y99.0 (ICD-10-CM) - Work related injury  Rationale for Evaluation and Treatment:  Rehabilitation  THERAPY DIAG:  No diagnosis found.  ONSET DATE: 12/27/2021  SUBJECTIVE:                                                                                                                                                                                           SUBJECTIVE STATEMENT: Initially saw ED related to fall from ladder while at work.     Communication c use of in person interpreter.   PERTINENT HISTORY:  Depression, hyperlipidemia, concurrent plantar fascia related pain.   PAIN:  NPRS scale: ***/10 Pain location: *** Pain description: *** Aggravating factors: *** Relieving factors: ***  PRECAUTIONS: None  WEIGHT BEARING RESTRICTIONS: No  FALLS:  Has patient fallen in last 6 months? No  LIVING ENVIRONMENT: Lives with: {[facility] lives with:25569::"lives with their family"} Lives in: {Lives in:25570} Stairs: {opstairs:27293} Has following equipment at home: {Assistive devices:23999}  OCCUPATION: ***  PLOF: Independent  PATIENT GOALS: ***  Next MD Visit:  PA recommended 4 week follow up on visit on 03/10/2022   OBJECTIVE:   DIAGNOSTIC FINDINGS:  ***  PATIENT SURVEYS:  FOTO eval:    predicted:    SCREENING FOR RED FLAGS: Bowel or bladder incontinence: {Yes/No:304960894} Cauda equina syndrome: {Yes/No:304960894}  COGNITION: Overall cognitive status: WFL normal      SENSATION: {sensation:27233}  MUSCLE LENGTH: Hamstrings: Right *** deg; Left *** deg Thomas test: Right *** deg; Left *** deg  POSTURE: {posture:25561}  PALPATION: ***  LUMBAR ROM:   AROM eval  Flexion   Extension   Right lateral flexion   Left lateral flexion   Right rotation   Left rotation    (Blank rows = not tested)  LOWER EXTREMITY ROM:     {AROM/PROM:27142}  Right eval Left eval  Hip flexion    Hip extension    Hip abduction    Hip adduction    Hip internal rotation    Hip external rotation    Knee flexion    Knee extension    Ankle  dorsiflexion    Ankle plantarflexion    Ankle inversion    Ankle eversion     (Blank rows = not tested)  LOWER EXTREMITY MMT:    MMT Right eval Left eval  Hip flexion    Hip extension    Hip abduction    Hip adduction    Hip internal rotation    Hip external rotation    Knee flexion    Knee extension    Ankle dorsiflexion    Ankle plantarflexion    Ankle inversion    Ankle eversion     (Blank rows = not tested)  LUMBAR SPECIAL TESTS:  {lumbar special test:25242}  FUNCTIONAL TESTS:  {Functional tests:24029}  GAIT: Distance walked: *** Assistive device utilized: {Assistive devices:23999} Level of assistance: {Levels of assistance:24026} Comments: ***  TODAY'S TREATMENT:                                                                                                                              DATE: ***  Therex:    HEP instruction/performance c cues for techniques, handout provided.  Trial set performed of each for comprehension and symptom assessment.  See below for exercise list  PATIENT EDUCATION:  Education details: HEP, POC Person educated: Patient Education method: Explanation, Demonstration, Verbal cues, and Handouts Education comprehension: verbalized understanding, returned demonstration, and verbal cues required  HOME EXERCISE PROGRAM: ***  ASSESSMENT:  CLINICAL IMPRESSION: Patient is a *** y.o. who comes to clinic with complaints of ***pain with mobility, strength and movement coordination deficits that impair their ability to perform usual daily and recreational functional activities without increase difficulty/symptoms at this time.  Patient to benefit from skilled PT  services to address impairments and limitations to improve to previous level of function without restriction secondary to condition.   OBJECTIVE IMPAIRMENTS: {opptimpairments:25111}.   ACTIVITY LIMITATIONS: {activitylimitations:27494}  PARTICIPATION LIMITATIONS:  {participationrestrictions:25113}  PERSONAL FACTORS: {Personal factors:25162} are also affecting patient's functional outcome.   REHAB POTENTIAL: {rehabpotential:25112}  CLINICAL DECISION MAKING: {clinical decision making:25114}  EVALUATION COMPLEXITY: {Evaluation complexity:25115}   GOALS: Goals reviewed with patient? Yes  SHORT TERM GOALS: (target date for Short term goals are 3 weeks ***)  1. Patient will demonstrate independent use of home exercise program to maintain progress from in clinic treatments.  Goal status: New  LONG TERM GOALS: (target dates for all long term goals are 10 weeks  *** )   1. Patient will demonstrate/report pain at worst less than or equal to 2/10 to facilitate minimal limitation in daily activity secondary to pain symptoms.  Goal status: New   2. Patient will demonstrate independent use of home exercise program to facilitate ability to maintain/progress functional gains from skilled physical therapy services.  Goal status: New   3. Patient will demonstrate FOTO outcome > or = *** % to indicate reduced disability due to condition.  Goal status: New   4. Patient will demonstrate lumbar extension 100 % WFL s symptoms to facilitate upright standing, walking posture at PLOF s limitation.  Goal status: New   5.  ***  Goal status: New   6.  *** Goal status: New   7.  *** Goal Status: New  PLAN:  PT FREQUENCY: 1-2x/week  PT DURATION: 10 weeks  PLANNED INTERVENTIONS: Therapeutic exercises, Therapeutic activity, Neuro Muscular re-education, Balance training, Gait training, Patient/Family education, Joint mobilization, Stair training, DME instructions, Dry Needling, Electrical stimulation, Cryotherapy, vasopneumatic device, Moist heat, Taping, Traction Ultrasound, Ionotophoresis '4mg'$ /ml Dexamethasone, and Manual therapy.  All included unless contraindicated  PLAN FOR NEXT SESSION: Review HEP knowledge/results.

## 2022-03-22 ENCOUNTER — Encounter: Payer: Self-pay | Admitting: Rehabilitative and Restorative Service Providers"

## 2022-03-22 ENCOUNTER — Other Ambulatory Visit: Payer: Self-pay

## 2022-03-22 ENCOUNTER — Ambulatory Visit (INDEPENDENT_AMBULATORY_CARE_PROVIDER_SITE_OTHER): Payer: Commercial Managed Care - HMO | Admitting: Rehabilitative and Restorative Service Providers"

## 2022-03-22 DIAGNOSIS — M6281 Muscle weakness (generalized): Secondary | ICD-10-CM | POA: Diagnosis not present

## 2022-03-22 DIAGNOSIS — R262 Difficulty in walking, not elsewhere classified: Secondary | ICD-10-CM

## 2022-03-22 DIAGNOSIS — M79605 Pain in left leg: Secondary | ICD-10-CM | POA: Diagnosis not present

## 2022-03-22 DIAGNOSIS — M5459 Other low back pain: Secondary | ICD-10-CM | POA: Diagnosis not present

## 2022-03-31 ENCOUNTER — Encounter: Payer: Self-pay | Admitting: Rehabilitative and Restorative Service Providers"

## 2022-03-31 ENCOUNTER — Ambulatory Visit: Payer: Commercial Managed Care - HMO | Admitting: Rehabilitative and Restorative Service Providers"

## 2022-03-31 DIAGNOSIS — R262 Difficulty in walking, not elsewhere classified: Secondary | ICD-10-CM

## 2022-03-31 DIAGNOSIS — M6281 Muscle weakness (generalized): Secondary | ICD-10-CM | POA: Diagnosis not present

## 2022-03-31 DIAGNOSIS — M79605 Pain in left leg: Secondary | ICD-10-CM | POA: Diagnosis not present

## 2022-03-31 DIAGNOSIS — M5459 Other low back pain: Secondary | ICD-10-CM

## 2022-03-31 NOTE — Therapy (Signed)
OUTPATIENT PHYSICAL THERAPY TREATMENT   Patient Name: Tinnie Kunin MRN: [id number] DOB:[birth year], 56 y.o., female Today's Date: 03/31/2022  END OF SESSION:  PT End of Session - 03/31/22 1428     Visit Number 2    Number of Visits 20    Date for PT Re-Evaluation 05/31/22    Authorization Type [employer] no copay 30 visit    Authorization - Visit Number 2    Authorization - Number of Visits 30    Progress Note Due on Visit 10    PT Start Time 2706    PT Stop Time 1507    PT Time Calculation (min) 39 min    Activity Tolerance Patient limited by pain    Behavior During Therapy [facility] for tasks assessed/performed              Past Medical History:  Diagnosis Date   Bilateral bunions    Depression    Hyperlipidemia    Left sided sciatica 03/2017   resolved   Plantar fasciitis, right    PTSD (post-traumatic stress disorder)    Pulmonary nodules 04/17/2018   Bilateral, noted on CXR   Past Surgical History:  Procedure Laterality Date   BUNIONECTOMY Left 05/16/2019   Procedure: Altamese [place] WITH METATARSAL OSTEOTOMY;  Surgeon: [name], DPM;  Location: [place];  Service: Podiatry;  Laterality: Left;   BUNIONECTOMY Right 11/22/2019   Procedure: Pauline Good;  Surgeon: Criselda Peaches, DPM;  Location: [place];  Service: Podiatry;  Laterality: Right;  IV SEDATION   HALLUX VALGUS AUSTIN Right 05/17/2018   Procedure: HALLUX VALGUS [place] RIGHT FOOT;  Surgeon: [name], DPM;  Location: [place];  Service: Podiatry;  Laterality: Right;   HAMMER TOE SURGERY Right 11/22/2019   Procedure: HAMMER TOE REPAIR SECOND, THIRD,  FOURTH AND FIFTH TOES RIGHT FOOT;  Surgeon: Criselda Peaches, DPM;  Location: [place];  Service: Podiatry;  Laterality: Right;  IV SEDATION   PLANTAR FASCIA RELEASE Right 05/17/2018   Procedure: PLANTAR FASCIOTOMY RIGHT FOOT;  Surgeon: [name], DPM;  Location:  [place];  Service: Podiatry;  Laterality: Right;   Steroid Injections     foot   Patient Active Problem List   Diagnosis Date Noted   Bilateral bunions 08/28/2018    PCP: Kerin Perna NP  REFERRING PROVIDER: Lanae Crumbly, PA-C  REFERRING DIAG: [phone] (ICD-10-CM) - Acute left-sided low back pain with left-sided sciatica Y99.0 (ICD-10-CM) - Work related injury  Rationale for Evaluation and Treatment: Rehabilitation  THERAPY DIAG:  Other low back pain  Pain in left leg  Muscle weakness (generalized)  Difficulty in walking, not elsewhere classified  ONSET DATE: 12/26/2021  SUBJECTIVE:  SUBJECTIVE STATEMENT: Pt indicated feeling a little better at times.  Reported she has still been working.    Communication c use of in person interpreter.   PERTINENT HISTORY:  Depression, hyperlipidemia, concurrent plantar fascia related pain.   PAIN:  NPRS scale: 7/10 upon arrival Pain location: low back pain, Lt leg Pain description: "pain" tingling in Lt leg at times, constant Aggravating factors: work, lying down, getting out of bed, walking Relieving factors: medicine helps some  PRECAUTIONS: None  WEIGHT BEARING RESTRICTIONS: No  FALLS:  Has patient fallen in last 6 months? No  LIVING ENVIRONMENT: Lives in: House/apartment c trouble Stairs: 2 stair to enter   OCCUPATION: [employer] -  Works shift of 10-12 hour shifts prior to fall.  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   PLOF: Independent, rides bus to get to work which requires walking(home to bus stop 7 mins, 7 mins to work),  PATIENT GOALS: Reduce pain  Next MD Visit:  PA recommended 4 week follow up on visit on 03/10/2022   OBJECTIVE:   PATIENT SURVEYS:   03/22/2022 FOTO eval: 22   predicted:  51  SCREENING FOR RED FLAGS: 03/22/2022 Bowel or bladder incontinence: No Cauda equina syndrome: No  COGNITION: 03/22/2022 Overall cognitive status: WFL normal      SENSATION: 03/22/2022 Not tested today due to time  MUSCLE LENGTH: 03/22/2022 None tested today  POSTURE: 03/22/2022 Reduced lumbar lordosis, rounded shoulders, mild forward trunk lean in standing posture.   PALPATION: 03/22/2022 Tenderness across lower lumbar bilateral, More on Lt.    LUMBAR ROM:   AROM 03/22/2022 03/31/2022  Flexion Movement to patella c end range pain in back and Lt leg   Extension < 25 % c end range pain lumbar (no change in Lt leg symptoms) 50% WFL  Right lateral flexion    Left lateral flexion    Right rotation    Left rotation     (Blank rows = not tested)  LOWER EXTREMITY ROM:      Right 03/22/2022 Left 03/22/2022  Hip flexion    Hip extension    Hip abduction    Hip adduction    Hip internal rotation    Hip external rotation    Knee flexion    Knee extension    Ankle dorsiflexion    Ankle plantarflexion    Ankle inversion    Ankle eversion     (Blank rows = not tested)  LOWER EXTREMITY MMT:    MMT Right 03/22/2022 Left 03/22/2022  Hip flexion 4/5 4/5 c leg pain/back pain  Hip extension    Hip abduction    Hip adduction    Hip internal rotation    Hip external rotation    Knee flexion 5/5 4/5 c leg pain  Knee extension 5/5 4/5 c leg pain  Ankle dorsiflexion 5/5 5/5  Ankle plantarflexion    Ankle inversion    Ankle eversion     (Blank rows = not tested)  LUMBAR SPECIAL TESTS:  03/22/2022 + slump on Lt for concordant Lt leg symptoms  FUNCTIONAL TESTS:  03/22/2022 No specific testing today  GAIT: 03/22/2022 Independent ambulation within clinic, mild forward trunk lean.   TODAY'S TREATMENT:  DATE: 03/31/2022  Therex:  Nustep lvl 5 UE/LE 6 mins  Supine lumbar trunk rotation to tolerance 15 sec x 5 bilateral  SKC 15 sec x 3 bilateral in hooklying supine  Supine green band clam shell x 15  Stnading lumbar extension x 5    Cues for home program c verbal and visual cues.   Estim:    IFC 10 mins to tolerance across lumbar region in sitting   TODAY'S TREATMENT:                                                                                                                              DATE: 03/22/2022  Estim:    IFC 10 mins to tolerance across lumbar region in sitting   PATIENT EDUCATION:  03/31/2022 Education details: HEP, POC Person educated: Patient Education method: Consulting civil engineer, Demonstration, Verbal cues, and Handouts Education comprehension: verbalized understanding, returned demonstration, and verbal cues required  HOME EXERCISE PROGRAM: Access Code: [email or web address] URL: [email or web address] Date: 03/31/2022 Prepared by: Scot Jun  Exercises - Supine Lower Trunk Rotation  - 2-3 x daily - 7 x weekly - 1 sets - 3-5 reps - 15 hold - Hooklying Single Knee to Chest Stretch  - 2-3 x daily - 7 x weekly - 1 sets - 3-5 reps - 15 hold - Standing Lumbar Extension with Counter  - 3-5 x daily - 7 x weekly - 1 sets - 5-10 reps  ASSESSMENT:  CLINICAL IMPRESSION: Although pain symptoms still reported as high, Pt demonstrated improvement mobility tolerance within clinic activity.  Lt leg limited more than Rt in movements.  Continued skilled PT services indicated to continue to address pain and progress towards goals.   OBJECTIVE IMPAIRMENTS: decreased activity tolerance, decreased balance, decreased coordination, decreased endurance, decreased mobility, difficulty walking, decreased ROM, decreased strength, hypomobility, increased fascial restrictions, impaired perceived functional ability, increased muscle spasms, impaired flexibility, improper body mechanics,  postural dysfunction, and pain.   ACTIVITY LIMITATIONS: carrying, lifting, bending, sitting, standing, squatting, sleeping, stairs, transfers, bed mobility, and locomotion level  PARTICIPATION LIMITATIONS: meal prep, cleaning, laundry, interpersonal relationship, shopping, community activity, and occupation  PERSONAL FACTORS: Transportation and pain severity, work environment  are also affecting patient's functional outcome.   REHAB POTENTIAL: Good  CLINICAL DECISION MAKING: Stable/uncomplicated  EVALUATION COMPLEXITY: Low   GOALS: Goals reviewed with patient? Yes  SHORT TERM GOALS: (target date for Short term goals are 3 weeks 04/12/2022)  1. Patient will demonstrate independent use of home exercise program to maintain progress from in clinic treatments.  Goal status: on going 03/31/2022  LONG TERM GOALS: (target dates for all long term goals are 10 weeks  05/31/2022 )   1. Patient will demonstrate/report pain at worst less than or equal to 2/10 to facilitate minimal limitation in daily activity secondary to pain symptoms.  Goal status: New   2. Patient will demonstrate independent use of home exercise program to facilitate ability to maintain/progress functional gains from skilled  physical therapy services.  Goal status: New   3. Patient will demonstrate FOTO outcome > or = 51 % to indicate reduced disability due to condition.  Goal status: New   4. Patient will demonstrate lumbar extension 100 % WFL s symptoms to facilitate upright standing, walking posture at PLOF s limitation.  Goal status: New   5.  Patient will demonstrate bilateral LE MMT 5/5 to facilitate ability to perform work and house activity including standing, walking and lifting.  Goal status: New   6.  Patient will demonstrate ability to perform work at [employer].  Goal status: New     PLAN:  PT FREQUENCY: 1-2x/week  PT DURATION: 10 weeks  PLANNED INTERVENTIONS: Therapeutic exercises, Therapeutic  activity, Neuro Muscular re-education, Balance training, Gait training, Patient/Family education, Joint mobilization, Stair training, DME instructions, Dry Needling, Electrical stimulation, Cryotherapy, vasopneumatic device, Moist heat, Taping, Traction Ultrasound, Ionotophoresis '4mg'$ /ml Dexamethasone, and Manual therapy.  All included unless contraindicated  PLAN FOR NEXT SESSION: Modalities as indicated.  Continued focus on improving mobility for lumbar and overall tolerance to activity to support standing and work postures.    Scot Jun, PT, DPT, OCS, ATC 03/31/22  2:59 PM

## 2022-04-04 ENCOUNTER — Encounter: Payer: Self-pay | Admitting: Physical Therapy

## 2022-04-04 ENCOUNTER — Ambulatory Visit: Payer: Commercial Managed Care - HMO | Admitting: Physical Therapy

## 2022-04-04 DIAGNOSIS — R262 Difficulty in walking, not elsewhere classified: Secondary | ICD-10-CM | POA: Diagnosis not present

## 2022-04-04 DIAGNOSIS — M79605 Pain in left leg: Secondary | ICD-10-CM

## 2022-04-04 DIAGNOSIS — M5459 Other low back pain: Secondary | ICD-10-CM

## 2022-04-04 DIAGNOSIS — M6281 Muscle weakness (generalized): Secondary | ICD-10-CM | POA: Diagnosis not present

## 2022-04-04 NOTE — Therapy (Signed)
OUTPATIENT PHYSICAL THERAPY TREATMENT   Patient Name: Lori Morse MRN: [id number] DOB:08-Jul-1965, 56 y.o., female Today's Date: 04/04/2022  END OF SESSION:  PT End of Session - 04/04/22 1504     Visit Number 3    Number of Visits 20    Date for PT Re-Evaluation 05/31/22    Authorization Type [employer] no copay 30 visit    Authorization - Visit Number 3    Authorization - Number of Visits 30    Progress Note Due on Visit 10    PT Start Time 3435    PT Stop Time 1515    PT Time Calculation (min) 43 min    Activity Tolerance Patient limited by pain    Behavior During Therapy [facility] for tasks assessed/performed              Past Medical History:  Diagnosis Date   Bilateral bunions    Depression    Hyperlipidemia    Left sided sciatica 03/2017   resolved   Plantar fasciitis, right    PTSD (post-traumatic stress disorder)    Pulmonary nodules 04/17/2018   Bilateral, noted on CXR   Past Surgical History:  Procedure Laterality Date   BUNIONECTOMY Left 05/16/2019   Procedure: Altamese [place] WITH METATARSAL OSTEOTOMY;  Surgeon: [name], DPM;  Location: [place];  Service: Podiatry;  Laterality: Left;   BUNIONECTOMY Right 11/22/2019   Procedure: Pauline Good;  Surgeon: Criselda Peaches, DPM;  Location: [place];  Service: Podiatry;  Laterality: Right;  IV SEDATION   HALLUX VALGUS AUSTIN Right 05/17/2018   Procedure: HALLUX VALGUS [place]USTIN RIGHT FOOT;  Surgeon: [name], DPM;  Location: [place];  Service: Podiatry;  Laterality: Right;   HAMMER TOE SURGERY Right 11/22/2019   Procedure: HAMMER TOE REPAIR SECOND, THIRD,  FOURTH AND FIFTH TOES RIGHT FOOT;  Surgeon: Criselda Peaches, DPM;  Location: [place];  Service: Podiatry;  Laterality: Right;  IV SEDATION   PLANTAR FASCIA RELEASE Right 05/17/2018   Procedure: PLANTAR FASCIOTOMY RIGHT FOOT;  Surgeon: [name], DPM;  Location:  [place];  Service: Podiatry;  Laterality: Right;   Steroid Injections     foot   Patient Active Problem List   Diagnosis Date Noted   Bilateral bunions 08/28/2018    PCP: Kerin Perna NP  REFERRING PROVIDER: Lanae Crumbly, PA-C  REFERRING DIAG: [phone] (ICD-10-CM) - Acute left-sided low back pain with left-sided sciatica Y99.0 (ICD-10-CM) - Work related injury  Rationale for Evaluation and Treatment: Rehabilitation  THERAPY DIAG:  Other low back pain  Pain in left leg  Muscle weakness (generalized)  Difficulty in walking, not elsewhere classified  ONSET DATE: 12/26/2021  SUBJECTIVE:  SUBJECTIVE STATEMENT: Pt arriving reporting 6/10 pain in her left side low back.   PERTINENT HISTORY:  Depression, hyperlipidemia, concurrent plantar fascia related pain.   PAIN:  NPRS scale: 6/10 Pain location: low back pain, Lt side Pain description: "pain" tingling in Lt leg at times, constant Aggravating factors: work, lying down, getting out of bed, walking Relieving factors: medicine helps some  PRECAUTIONS: None  WEIGHT BEARING RESTRICTIONS: No  FALLS:  Has patient fallen in last 6 months? No  LIVING ENVIRONMENT: Lives in: House/apartment c trouble Stairs: 2 stair to enter   OCCUPATION: [employer] -  Works shift of 10-12 hour shifts prior to fall.  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   PLOF: Independent, rides bus to get to work which requires walking(home to bus stop 7 mins, 7 mins to work),  PATIENT GOALS: Reduce pain  Next MD Visit:  PA recommended 4 week follow up on visit on 03/10/2022   OBJECTIVE:   PATIENT SURVEYS:  03/22/2022 FOTO eval: 22   predicted:  51  SCREENING FOR RED FLAGS: 03/22/2022 Bowel or  bladder incontinence: No Cauda equina syndrome: No  COGNITION: 03/22/2022 Overall cognitive status: WFL normal      SENSATION: 03/22/2022 Not tested today due to time  MUSCLE LENGTH: 03/22/2022 None tested today  POSTURE: 03/22/2022 Reduced lumbar lordosis, rounded shoulders, mild forward trunk lean in standing posture.   PALPATION: 03/22/2022 Tenderness across lower lumbar bilateral, More on Lt.    LUMBAR ROM:   AROM 03/22/2022 03/31/2022  Flexion Movement to patella c end range pain in back and Lt leg   Extension < 25 % c end range pain lumbar (no change in Lt leg symptoms) 50% WFL  Right lateral flexion    Left lateral flexion    Right rotation    Left rotation     (Blank rows = not tested)  LOWER EXTREMITY ROM:      Right 03/22/2022 Left 03/22/2022  Hip flexion    Hip extension    Hip abduction    Hip adduction    Hip internal rotation    Hip external rotation    Knee flexion    Knee extension    Ankle dorsiflexion    Ankle plantarflexion    Ankle inversion    Ankle eversion     (Blank rows = not tested)  LOWER EXTREMITY MMT:    MMT Right 03/22/2022 Left 03/22/2022  Hip flexion 4/5 4/5 c leg pain/back pain  Hip extension    Hip abduction    Hip adduction    Hip internal rotation    Hip external rotation    Knee flexion 5/5 4/5 c leg pain  Knee extension 5/5 4/5 c leg pain  Ankle dorsiflexion 5/5 5/5  Ankle plantarflexion    Ankle inversion    Ankle eversion     (Blank rows = not tested)  LUMBAR SPECIAL TESTS:  03/22/2022 + slump on Lt for concordant Lt leg symptoms  FUNCTIONAL TESTS:  03/22/2022 No specific testing today  GAIT: 03/22/2022 Independent ambulation within clinic, mild forward trunk lean.   TODAY'S TREATMENT:  DATE:  04/04/22:  Therex: Nustep lvl 5 UE/LE 6 mins Cat/Cow x 10 holding 5  sec Supine lumbar trunk rotation to tolerance 15 sec x 5 bilateral SKC 15 sec x 3 bilateral in hooklying supine Bridge with clam shell using Level 3 band x 10  Ball squeezes: x 15 holding 5 sec Standing lumbar extension x 5  Modalities:  E-stim: IFC x 10 minutes intensity to tolerance, lumbar paraspinals, prone c moist heat       03/31/2022  Therex:  Nustep lvl 5 UE/LE 6 mins  Supine lumbar trunk rotation to tolerance 15 sec x 5 bilateral  SKC 15 sec x 3 bilateral in hooklying supine  Supine green band clam shell x 15  Stnading lumbar extension x 5    Cues for home program c verbal and visual cues.   Estim:    IFC 10 mins to tolerance across lumbar region in sitting   TODAY'S TREATMENT:                                                                                                                              DATE: 03/22/2022  Estim:    IFC 10 mins to tolerance across lumbar region in sitting   PATIENT EDUCATION:  03/31/2022 Education details: HEP, POC Person educated: Patient Education method: Consulting civil engineer, Demonstration, Verbal cues, and Handouts Education comprehension: verbalized understanding, returned demonstration, and verbal cues required  HOME EXERCISE PROGRAM: Access Code: [email or web address] URL: [email or web address] Date: 03/31/2022 Prepared by: Scot Jun  Exercises - Supine Lower Trunk Rotation  - 2-3 x daily - 7 x weekly - 1 sets - 3-5 reps - 15 hold - Hooklying Single Knee to Chest Stretch  - 2-3 x daily - 7 x weekly - 1 sets - 3-5 reps - 15 hold - Standing Lumbar Extension with Counter  - 3-5 x daily - 7 x weekly - 1 sets - 5-10 reps  ASSESSMENT:  CLINICAL IMPRESSION: Pt tolerating exercises well for lumbar mobility and LE strengthening.  Continue skilled PT to maximize pt's function.   OBJECTIVE IMPAIRMENTS: decreased activity tolerance, decreased balance, decreased coordination, decreased endurance, decreased mobility, difficulty  walking, decreased ROM, decreased strength, hypomobility, increased fascial restrictions, impaired perceived functional ability, increased muscle spasms, impaired flexibility, improper body mechanics, postural dysfunction, and pain.   ACTIVITY LIMITATIONS: carrying, lifting, bending, sitting, standing, squatting, sleeping, stairs, transfers, bed mobility, and locomotion level  PARTICIPATION LIMITATIONS: meal prep, cleaning, laundry, interpersonal relationship, shopping, community activity, and occupation  PERSONAL FACTORS: Transportation and pain severity, work environment  are also affecting patient's functional outcome.   REHAB POTENTIAL: Good  CLINICAL DECISION MAKING: Stable/uncomplicated  EVALUATION COMPLEXITY: Low   GOALS: Goals reviewed with patient? Yes  SHORT TERM GOALS: (target date for Short term goals are 3 weeks 04/12/2022)  1. Patient will demonstrate independent use of home exercise program to maintain progress from in clinic treatments.  Goal status: MET 04/04/22  LONG TERM GOALS: (target dates for  all long term goals are 10 weeks  05/31/2022 )   1. Patient will demonstrate/report pain at worst less than or equal to 2/10 to facilitate minimal limitation in daily activity secondary to pain symptoms.  Goal status: New   2. Patient will demonstrate independent use of home exercise program to facilitate ability to maintain/progress functional gains from skilled physical therapy services.  Goal status: New   3. Patient will demonstrate FOTO outcome > or = 51 % to indicate reduced disability due to condition.  Goal status: New   4. Patient will demonstrate lumbar extension 100 % WFL s symptoms to facilitate upright standing, walking posture at PLOF s limitation.  Goal status: New   5.  Patient will demonstrate bilateral LE MMT 5/5 to facilitate ability to perform work and house activity including standing, walking and lifting.  Goal status: New   6.  Patient will  demonstrate ability to perform work at [employer].  Goal status: New     PLAN:  PT FREQUENCY: 1-2x/week  PT DURATION: 10 weeks  PLANNED INTERVENTIONS: Therapeutic exercises, Therapeutic activity, Neuro Muscular re-education, Balance training, Gait training, Patient/Family education, Joint mobilization, Stair training, DME instructions, Dry Needling, Electrical stimulation, Cryotherapy, vasopneumatic device, Moist heat, Taping, Traction Ultrasound, Ionotophoresis 66m/ml Dexamethasone, and Manual therapy.  All included unless contraindicated  PLAN FOR NEXT SESSION: Modalities as indicated.  Continued focus on improving mobility for lumbar and overall tolerance to activity to support standing and work postures.    JKearney Hard PT, MPT 04/04/22 3:07 PM   04/04/22  3:07 PM

## 2022-04-07 ENCOUNTER — Encounter: Payer: Commercial Managed Care - HMO | Admitting: Physical Therapy

## 2022-04-28 ENCOUNTER — Ambulatory Visit: Payer: Commercial Managed Care - HMO | Admitting: Rehabilitative and Restorative Service Providers"

## 2022-04-28 ENCOUNTER — Encounter: Payer: Self-pay | Admitting: Rehabilitative and Restorative Service Providers"

## 2022-04-28 DIAGNOSIS — M5459 Other low back pain: Secondary | ICD-10-CM

## 2022-04-28 DIAGNOSIS — M6281 Muscle weakness (generalized): Secondary | ICD-10-CM | POA: Diagnosis not present

## 2022-04-28 DIAGNOSIS — M79605 Pain in left leg: Secondary | ICD-10-CM

## 2022-04-28 DIAGNOSIS — R262 Difficulty in walking, not elsewhere classified: Secondary | ICD-10-CM | POA: Diagnosis not present

## 2022-04-28 NOTE — Therapy (Signed)
OUTPATIENT PHYSICAL THERAPY TREATMENT   Patient Name: Lori Morse MRN: [id number] DOB:[birth year], 56 y.o., female Today's Date: 04/28/2022  END OF SESSION:  PT End of Session - 04/28/22 0900     Visit Number 4    Number of Visits 20    Date for PT Re-Evaluation 05/31/22    Authorization Type [employer] no copay 30 visit    Authorization - Number of Visits 30    Progress Note Due on Visit 10    PT Start Time 0854    PT Stop Time 0935    PT Time Calculation (min) 41 min    Activity Tolerance Patient tolerated treatment well    Behavior During Therapy [facility] for tasks assessed/performed               Past Medical History:  Diagnosis Date   Bilateral bunions    Depression    Hyperlipidemia    Left sided sciatica 03/2017   resolved   Plantar fasciitis, right    PTSD (post-traumatic stress disorder)    Pulmonary nodules 04/17/2018   Bilateral, noted on CXR   Past Surgical History:  Procedure Laterality Date   BUNIONECTOMY Left 05/16/2019   Procedure: Altamese [place] WITH METATARSAL OSTEOTOMY;  Surgeon: [name], DPM;  Location: [place];  Service: Podiatry;  Laterality: Left;   BUNIONECTOMY Right 11/22/2019   Procedure: Pauline Good;  Surgeon: Criselda Peaches, DPM;  Location: [place];  Service: Podiatry;  Laterality: Right;  IV SEDATION   HALLUX VALGUS AUSTIN Right 05/17/2018   Procedure: HALLUX VALGUS [place] RIGHT FOOT;  Surgeon: [name], DPM;  Location: [place];  Service: Podiatry;  Laterality: Right;   HAMMER TOE SURGERY Right 11/22/2019   Procedure: HAMMER TOE REPAIR SECOND, THIRD,  FOURTH AND FIFTH TOES RIGHT FOOT;  Surgeon: Criselda Peaches, DPM;  Location: [place];  Service: Podiatry;  Laterality: Right;  IV SEDATION   PLANTAR FASCIA RELEASE Right 05/17/2018   Procedure: PLANTAR FASCIOTOMY RIGHT FOOT;  Surgeon: [name], DPM;  Location: [place];   Service: Podiatry;  Laterality: Right;   Steroid Injections     foot   Patient Active Problem List   Diagnosis Date Noted   Bilateral bunions 08/28/2018    PCP: Kerin Perna NP  REFERRING PROVIDER: Lanae Crumbly, PA-C  REFERRING DIAG: [phone] (ICD-10-CM) - Acute left-sided low back pain with left-sided sciatica Y99.0 (ICD-10-CM) - Work related injury  Rationale for Evaluation and Treatment: Rehabilitation  THERAPY DIAG:  Other low back pain  Pain in left leg  Muscle weakness (generalized)  Difficulty in walking, not elsewhere classified  ONSET DATE: 12/26/2021  SUBJECTIVE:  SUBJECTIVE STATEMENT: She indicated feeling sometimes having improvement in symptoms. She reported having increased trouble with work usually and reported she couldn't squat without pain.  She reported mild improvement overall.  Pt indicated she feels reliant on pain killers for getting through work.   PERTINENT HISTORY:  Depression, hyperlipidemia, concurrent plantar fascia related pain.   PAIN:  NPRS scale: 7/10 prior to medicine, 0/10 with medicine today.  Pain location: low back pain, Lt side Pain description: "pain" tingling in Lt leg at times, constant Aggravating factors: work, lying down, getting out of bed, walking Relieving factors: medicine helps some  PRECAUTIONS: None  WEIGHT BEARING RESTRICTIONS: No  FALLS:  Has patient fallen in last 6 months? No  LIVING ENVIRONMENT: Lives in: House/apartment c trouble Stairs: 2 stair to enter   OCCUPATION: [employer] -  Works shift of 10-12 hour shifts prior to fall.  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   PLOF: Independent, rides bus to get to work which requires walking(home to bus stop 7 mins, 7 mins to  work),  PATIENT GOALS: Reduce pain  Next MD Visit:  PA recommended 4 week follow up on visit on 03/10/2022   OBJECTIVE:   PATIENT SURVEYS:  04/28/2022 : FOTO Update: 44  03/22/2022 FOTO eval: 22   predicted:  51  SCREENING FOR RED FLAGS: 03/22/2022 Bowel or bladder incontinence: No Cauda equina syndrome: No  COGNITION: 03/22/2022 Overall cognitive status: WFL normal      SENSATION: 03/22/2022 Not tested today due to time  MUSCLE LENGTH: 03/22/2022 None tested today  POSTURE: 03/22/2022 Reduced lumbar lordosis, rounded shoulders, mild forward trunk lean in standing posture.   PALPATION: 03/22/2022 Tenderness across lower lumbar bilateral, More on Lt.    LUMBAR ROM:   AROM 03/22/2022 03/31/2022 04/28/2022  Flexion Movement to patella c end range pain in back and Lt leg  Movement to ankles with lateral lower leg Lt pain noted, better with several reps  Extension < 25 % c end range pain lumbar (no change in Lt leg symptoms) 50% WFL 50% WFL c mild pain reported lumbar, no leg symptoms change  Repeated x 5 in standing with reduced pain c reps  Right lateral flexion     Left lateral flexion     Right rotation     Left rotation      (Blank rows = not tested)  LOWER EXTREMITY ROM:      Right 03/22/2022 Left 03/22/2022  Hip flexion    Hip extension    Hip abduction    Hip adduction    Hip internal rotation    Hip external rotation    Knee flexion    Knee extension    Ankle dorsiflexion    Ankle plantarflexion    Ankle inversion    Ankle eversion     (Blank rows = not tested)  LOWER EXTREMITY MMT:    MMT Right 03/22/2022 Left 03/22/2022 Left 04/28/2022  Hip flexion 4/5 4/5 c leg pain/back pain 5/5  Hip extension     Hip abduction     Hip adduction     Hip internal rotation     Hip external rotation     Knee flexion 5/5 4/5 c leg pain 5/5 c pain in lower leg  Knee extension 5/5 4/5 c leg pain 5/5  Ankle dorsiflexion 5/5 5/5 5/5  Ankle  plantarflexion     Ankle inversion     Ankle eversion      (Blank rows =  not tested)  LUMBAR SPECIAL TESTS:  03/22/2022 + slump on Lt for concordant Lt leg symptoms  FUNCTIONAL TESTS:  03/22/2022 No specific testing today  GAIT: 03/22/2022 Independent ambulation within clinic, mild forward trunk lean.   TODAY'S TREATMENT:                                                                                                                              DATE: 04/28/22:  Therex: Nustep lvl 6 UE/LE 8 mins Standing green band rows 2 x 15 Standing green band gh ext 2 x 15 Standing lumbar extension x 5, flexion x5  Supine lumbar trunk rotation to tolerance 15 sec x 3 bilateral Bridge 3 sec hold x15  Modalities:  E-stim: IFC x 10 minutes intensity to tolerance, lumbar paraspinals sitting  TODAY'S TREATMENT:                                                                                                                              DATE: 04/04/22:  Therex: Nustep lvl 5 UE/LE 6 mins Cat/Cow x 10 holding 5 sec Supine lumbar trunk rotation to tolerance 15 sec x 5 bilateral SKC 15 sec x 3 bilateral in hooklying supine Bridge with clam shell using Level 3 band x 10  Ball squeezes: x 15 holding 5 sec Standing lumbar extension x 5  Modalities:  E-stim: IFC x 10 minutes intensity to tolerance, lumbar paraspinals, prone c moist heat    TODAY'S TREATMENT:                                                                                                                              DATE: 03/31/2022  Therex:  Nustep lvl 5 UE/LE 6 mins  Supine lumbar trunk rotation to tolerance 15 sec x 5 bilateral  SKC 15 sec x 3 bilateral in hooklying supine  Supine green band  clam shell x 15  Stnading lumbar extension x 5    Cues for home program c verbal and visual cues.   Estim:    IFC 10 mins to tolerance across lumbar region in sitting    PATIENT EDUCATION:  04/28/2022 Education details: HEP  updates Person educated: Patient Education method: Explanation, Demonstration, Verbal cues, and Handouts Education comprehension: verbalized understanding, returned demonstration, and verbal cues required  HOME EXERCISE PROGRAM: Access Code: [email or web address] URL: [email or web address] Date: 04/28/2022 Prepared by: Scot Jun  Exercises - Supine Lower Trunk Rotation  - 2-3 x daily - 7 x weekly - 1 sets - 3-5 reps - 15 hold - Hooklying Single Knee to Chest Stretch  - 2-3 x daily - 7 x weekly - 1 sets - 3-5 reps - 15 hold - Standing Lumbar Extension with Counter  - 3-5 x daily - 7 x weekly - 1 sets - 5-10 reps - Standing Bilateral Low Shoulder Row with Anchored Resistance  - 1-2 x daily - 7 x weekly - 2-3 sets - 10-15 reps - Shoulder Extension with Resistance  - 1-2 x daily - 7 x weekly - 2-3 sets - 10-15 reps - Supine Bridge  - 1-2 x daily - 7 x weekly - 1-2 sets - 10 reps - 2 hold - Supine Single Knee to Chest Stretch  - 1-2 x daily - 7 x weekly - 1 sets - 5 reps - 15 hold  ASSESSMENT:  CLINICAL IMPRESSION: Pt has attended 4 visits overall during course of treatment, reporting mild improvements but continued difficulty in work environment activity.   Pt has demonstrated some improvements in objective data including lumbar mobility and strength in Lt leg as compared to evaluation.  Continued impairments and symptoms present that impair her functional activity tolerance and ability to work.   OBJECTIVE IMPAIRMENTS: decreased activity tolerance, decreased balance, decreased coordination, decreased endurance, decreased mobility, difficulty walking, decreased ROM, decreased strength, hypomobility, increased fascial restrictions, impaired perceived functional ability, increased muscle spasms, impaired flexibility, improper body mechanics, postural dysfunction, and pain.   ACTIVITY LIMITATIONS: carrying, lifting, bending, sitting, standing, squatting, sleeping, stairs, transfers, bed  mobility, and locomotion level  PARTICIPATION LIMITATIONS: meal prep, cleaning, laundry, interpersonal relationship, shopping, community activity, and occupation  PERSONAL FACTORS: Transportation and pain severity, work environment  are also affecting patient's functional outcome.   REHAB POTENTIAL: Good  CLINICAL DECISION MAKING: Stable/uncomplicated  EVALUATION COMPLEXITY: Low   GOALS: Goals reviewed with patient? Yes  SHORT TERM GOALS: (target date for Short term goals are 3 weeks 04/12/2022)  1. Patient will demonstrate independent use of home exercise program to maintain progress from in clinic treatments.  Goal status: MET 04/04/22  LONG TERM GOALS: (target dates for all long term goals are 10 weeks  05/31/2022 )   1. Patient will demonstrate/report pain at worst less than or equal to 2/10 to facilitate minimal limitation in daily activity secondary to pain symptoms.  Goal status: on going 04/28/2022   2. Patient will demonstrate independent use of home exercise program to facilitate ability to maintain/progress functional gains from skilled physical therapy services.  Goal status: on going 04/28/2022   3. Patient will demonstrate FOTO outcome > or = 51 % to indicate reduced disability due to condition.  Goal status: on going 04/28/2022   4. Patient will demonstrate lumbar extension 100 % WFL s symptoms to facilitate upright standing, walking posture at PLOF s limitation.  Goal status: on going 04/28/2022   5.  Patient will demonstrate  bilateral LE MMT 5/5 to facilitate ability to perform work and house activity including standing, walking and lifting.  Goal status: on going 04/28/2022   6.  Patient will demonstrate ability to perform work at [employer].  Goal status: on going 04/28/2022     PLAN:  PT FREQUENCY: 1-2x/week  PT DURATION: 10 weeks  PLANNED INTERVENTIONS: Therapeutic exercises, Therapeutic activity, Neuro Muscular re-education, Balance training, Gait  training, Patient/Family education, Joint mobilization, Stair training, DME instructions, Dry Needling, Electrical stimulation, Cryotherapy, vasopneumatic device, Moist heat, Taping, Traction Ultrasound, Ionotophoresis 20m/ml Dexamethasone, and Manual therapy.  All included unless contraindicated  PLAN FOR NEXT SESSION: Progressive mobility improvements and strengthening to improve work activity tasks.    MScot Jun PT, DPT, OCS, ATC 04/28/22  10:20 AM

## 2022-05-05 ENCOUNTER — Ambulatory Visit: Payer: Commercial Managed Care - HMO | Admitting: Rehabilitative and Restorative Service Providers"

## 2022-05-05 ENCOUNTER — Encounter: Payer: Self-pay | Admitting: Rehabilitative and Restorative Service Providers"

## 2022-05-05 DIAGNOSIS — M79605 Pain in left leg: Secondary | ICD-10-CM

## 2022-05-05 DIAGNOSIS — M5459 Other low back pain: Secondary | ICD-10-CM

## 2022-05-05 DIAGNOSIS — M6281 Muscle weakness (generalized): Secondary | ICD-10-CM

## 2022-05-05 DIAGNOSIS — R262 Difficulty in walking, not elsewhere classified: Secondary | ICD-10-CM

## 2022-05-05 NOTE — Therapy (Signed)
OUTPATIENT PHYSICAL THERAPY TREATMENT   Patient Name: Danilyn Cocke MRN: [id number] DOB:02-12-1966, 57 y.o., female Today's Date: 05/05/2022  END OF SESSION:  PT End of Session - 05/05/22 0927     Visit Number 5    Number of Visits 20    Date for PT Re-Evaluation 05/31/22    Authorization Type Insurance cost uncertain on today's visit    Authorization - Number of Visits --    Progress Note Due on Visit 10    PT Start Time 0927    PT Stop Time 1007    PT Time Calculation (min) 40 min    Activity Tolerance Patient tolerated treatment well    Behavior During Therapy [facility] for tasks assessed/performed                Past Medical History:  Diagnosis Date   Bilateral bunions    Depression    Hyperlipidemia    Left sided sciatica 03/2017   resolved   Plantar fasciitis, right    PTSD (post-traumatic stress disorder)    Pulmonary nodules 04/17/2018   Bilateral, noted on CXR   Past Surgical History:  Procedure Laterality Date   BUNIONECTOMY Left 05/16/2019   Procedure: Altamese Crocker WITH METATARSAL OSTEOTOMY;  Surgeon: [name], DPM;  Location: [place];  Service: Podiatry;  Laterality: Left;   BUNIONECTOMY Right 11/22/2019   Procedure: Pauline Good;  Surgeon: Criselda Peaches, DPM;  Location: [place];  Service: Podiatry;  Laterality: Right;  IV SEDATION   HALLUX VALGUS AUSTIN Right 05/17/2018   Procedure: HALLUX VALGUS AUSTIN RIGHT FOOT;  Surgeon: [name], DPM;  Location: [place];  Service: Podiatry;  Laterality: Right;   HAMMER TOE SURGERY Right 11/22/2019   Procedure: HAMMER TOE REPAIR SECOND, THIRD,  FOURTH AND FIFTH TOES RIGHT FOOT;  Surgeon: Criselda Peaches, DPM;  Location: [place];  Service: Podiatry;  Laterality: Right;  IV SEDATION   PLANTAR FASCIA RELEASE Right 05/17/2018   Procedure: PLANTAR FASCIOTOMY RIGHT FOOT;  Surgeon: [name], DPM;  Location: [place];  Service: Podiatry;  Laterality: Right;   Steroid Injections     foot   Patient Active Problem List   Diagnosis Date Noted   Bilateral bunions 08/28/2018    PCP: Kerin Perna NP  REFERRING PROVIDER: Lanae Crumbly, PA-C  REFERRING DIAG: [phone] (ICD-10-CM) - Acute left-sided low back pain with left-sided sciatica Y99.0 (ICD-10-CM) - Work related injury  Rationale for Evaluation and Treatment: Rehabilitation  THERAPY DIAG:  Other low back pain  Pain in left leg  Muscle weakness (generalized)  Difficulty in walking, not elsewhere classified  ONSET DATE: 12/26/2021  SUBJECTIVE:  SUBJECTIVE STATEMENT: Pt indicated her insurance change for this year that has led to large increase in cost.  Pt indicated similar report that taking the medicine helps lower her pain.  Pt indicated work is ok with taking the medicine but pain increases with work day.    Communication through interpreter.   PERTINENT HISTORY:  Depression, hyperlipidemia, concurrent plantar fascia related pain.   PAIN:  NPRS scale: 7/10 after work Pain location: low back pain, Lt side Pain description: "pain" tingling in Lt leg at times, constant Aggravating factors: work, lying down, getting out of bed, walking Relieving factors: medicine helps some  PRECAUTIONS: None  WEIGHT BEARING RESTRICTIONS: No  FALLS:  Has patient fallen in last 6 months? No  LIVING ENVIRONMENT: Lives in: House/apartment c trouble Stairs: 2 stair to enter   OCCUPATION: [employer] -  Works shift of 10-12 hour shifts prior to fall.  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   PLOF: Independent, rides bus to get to work which requires walking(home to bus stop 7 mins, 7 mins to  work),  PATIENT GOALS: Reduce pain  Next MD Visit:  PA recommended 4 week follow up on visit on 03/10/2022   OBJECTIVE:   PATIENT SURVEYS:  04/28/2022 : FOTO Update: 44  03/22/2022 FOTO eval: 22   predicted:  51  SCREENING FOR RED FLAGS: 03/22/2022 Bowel or bladder incontinence: No Cauda equina syndrome: No  COGNITION: 03/22/2022 Overall cognitive status: WFL normal      SENSATION: 03/22/2022 Not tested today due to time  MUSCLE LENGTH: 03/22/2022 None tested today  POSTURE: 03/22/2022 Reduced lumbar lordosis, rounded shoulders, mild forward trunk lean in standing posture.   PALPATION: 03/22/2022 Tenderness across lower lumbar bilateral, More on Lt.    LUMBAR ROM:   AROM 03/22/2022 03/31/2022 04/28/2022  Flexion Movement to patella c end range pain in back and Lt leg  Movement to ankles with lateral lower leg Lt pain noted, better with several reps  Extension < 25 % c end range pain lumbar (no change in Lt leg symptoms) 50% WFL 50% WFL c mild pain reported lumbar, no leg symptoms change  Repeated x 5 in standing with reduced pain c reps  Right lateral flexion     Left lateral flexion     Right rotation     Left rotation      (Blank rows = not tested)  LOWER EXTREMITY ROM:      Right 03/22/2022 Left 03/22/2022  Hip flexion    Hip extension    Hip abduction    Hip adduction    Hip internal rotation    Hip external rotation    Knee flexion    Knee extension    Ankle dorsiflexion    Ankle plantarflexion    Ankle inversion    Ankle eversion     (Blank rows = not tested)  LOWER EXTREMITY MMT:    MMT Right 03/22/2022 Left 03/22/2022 Left 04/28/2022  Hip flexion 4/5 4/5 c leg pain/back pain 5/5  Hip extension     Hip abduction     Hip adduction     Hip internal rotation     Hip external rotation     Knee flexion 5/5 4/5 c leg pain 5/5 c pain in lower leg  Knee extension 5/5 4/5 c leg pain 5/5  Ankle dorsiflexion 5/5 5/5 5/5  Ankle  plantarflexion     Ankle inversion     Ankle eversion      (  Blank rows = not tested)  LUMBAR SPECIAL TESTS:  03/22/2022 + slump on Lt for concordant Lt leg symptoms  FUNCTIONAL TESTS:  03/22/2022 No specific testing today  GAIT: 03/22/2022 Independent ambulation within clinic, mild forward trunk lean.   TODAY'S TREATMENT:                                                                                                                        DATE: 05/05/2022:  Therex: Nustep lvl 6 UE/LE 10 mins Leg press double leg 62 lbs 2 x 15 Supine trunk rotation 15 sec x 3 bilateral Bridge 5 sec hold x10 Supine pball press down 5 sec hold x 10  Supine Clam shell isometric hold contralateral leg green band x 20 bilateral  Prone opposite arm/leg lift 3 sec hold x 10 bilateral (mild pain and end)    TODAY'S TREATMENT:                                                                                                                        DATE: 04/28/22:  Therex: Nustep lvl 6 UE/LE 8 mins Standing green band rows 2 x 15 Standing green band gh ext 2 x 15 Standing lumbar extension x 5, flexion x5  Supine lumbar trunk rotation to tolerance 15 sec x 3 bilateral Bridge 3 sec hold x15  Modalities:  E-stim: IFC x 10 minutes intensity to tolerance, lumbar paraspinals sitting  TODAY'S TREATMENT:                                                                                                                              DATE: 04/04/22:  Therex: Nustep lvl 5 UE/LE 6 mins Cat/Cow x 10 holding 5 sec Supine lumbar trunk rotation to tolerance 15 sec x 5 bilateral SKC 15 sec x 3 bilateral in hooklying supine Bridge with clam shell using Level 3 band x 10  Ball squeezes: x 15 holding 5  sec Standing lumbar extension x 5  Modalities:  E-stim: IFC x 10 minutes intensity to tolerance, lumbar paraspinals, prone c moist heat     PATIENT EDUCATION:  04/28/2022 Education details: HEP updates Person educated:  Patient Education method: Explanation, Demonstration, Verbal cues, and Handouts Education comprehension: verbalized understanding, returned demonstration, and verbal cues required  HOME EXERCISE PROGRAM: Access Code: [email or web address] URL: [email or web address] Date: 04/28/2022 Prepared by: Scot Jun  Exercises - Supine Lower Trunk Rotation  - 2-3 x daily - 7 x weekly - 1 sets - 3-5 reps - 15 hold - Hooklying Single Knee to Chest Stretch  - 2-3 x daily - 7 x weekly - 1 sets - 3-5 reps - 15 hold - Standing Lumbar Extension with Counter  - 3-5 x daily - 7 x weekly - 1 sets - 5-10 reps - Standing Bilateral Low Shoulder Row with Anchored Resistance  - 1-2 x daily - 7 x weekly - 2-3 sets - 10-15 reps - Shoulder Extension with Resistance  - 1-2 x daily - 7 x weekly - 2-3 sets - 10-15 reps - Supine Bridge  - 1-2 x daily - 7 x weekly - 1-2 sets - 10 reps - 2 hold - Supine Single Knee to Chest Stretch  - 1-2 x daily - 7 x weekly - 1 sets - 5 reps - 15 hold  ASSESSMENT:  CLINICAL IMPRESSION: Due to possible cost increase, adjustment to treatment plan may be required.  Added additional strengthening today to help improve tolerance to physical activity.  Pt did well with mild complaints on prone exercise but resolved c rest.  May continue to benefit from progression towards goals.   OBJECTIVE IMPAIRMENTS: decreased activity tolerance, decreased balance, decreased coordination, decreased endurance, decreased mobility, difficulty walking, decreased ROM, decreased strength, hypomobility, increased fascial restrictions, impaired perceived functional ability, increased muscle spasms, impaired flexibility, improper body mechanics, postural dysfunction, and pain.   ACTIVITY LIMITATIONS: carrying, lifting, bending, sitting, standing, squatting, sleeping, stairs, transfers, bed mobility, and locomotion level  PARTICIPATION LIMITATIONS: meal prep, cleaning, laundry, interpersonal relationship,  shopping, community activity, and occupation  PERSONAL FACTORS: Transportation and pain severity, work environment  are also affecting patient's functional outcome.   REHAB POTENTIAL: Good  CLINICAL DECISION MAKING: Stable/uncomplicated  EVALUATION COMPLEXITY: Low   GOALS: Goals reviewed with patient? Yes  SHORT TERM GOALS: (target date for Short term goals are 3 weeks 04/12/2022)  1. Patient will demonstrate independent use of home exercise program to maintain progress from in clinic treatments.  Goal status: MET 04/04/22  LONG TERM GOALS: (target dates for all long term goals are 10 weeks  05/31/2022 )   1. Patient will demonstrate/report pain at worst less than or equal to 2/10 to facilitate minimal limitation in daily activity secondary to pain symptoms.  Goal status: on going 04/28/2022   2. Patient will demonstrate independent use of home exercise program to facilitate ability to maintain/progress functional gains from skilled physical therapy services.  Goal status: on going 04/28/2022   3. Patient will demonstrate FOTO outcome > or = 51 % to indicate reduced disability due to condition.  Goal status: on going 04/28/2022   4. Patient will demonstrate lumbar extension 100 % WFL s symptoms to facilitate upright standing, walking posture at PLOF s limitation.  Goal status: on going 04/28/2022   5.  Patient will demonstrate bilateral LE MMT 5/5 to facilitate ability to perform work and house activity including standing, walking and lifting.  Goal status: on going 04/28/2022  6.  Patient will demonstrate ability to perform work at [employer].  Goal status: on going 04/28/2022     PLAN:  PT FREQUENCY: 1-2x/week  PT DURATION: 10 weeks  PLANNED INTERVENTIONS: Therapeutic exercises, Therapeutic activity, Neuro Muscular re-education, Balance training, Gait training, Patient/Family education, Joint mobilization, Stair training, DME instructions, Dry Needling, Electrical  stimulation, Cryotherapy, vasopneumatic device, Moist heat, Taping, Traction Ultrasound, Ionotophoresis 38m/ml Dexamethasone, and Manual therapy.  All included unless contraindicated  PLAN FOR NEXT SESSION: Strengthening for back/LE   MScot Jun PT, DPT, OCS, ATC 05/05/22  10:11 AM

## 2022-05-12 ENCOUNTER — Ambulatory Visit: Payer: 59 | Admitting: Rehabilitative and Restorative Service Providers"

## 2022-05-12 ENCOUNTER — Encounter: Payer: Self-pay | Admitting: Rehabilitative and Restorative Service Providers"

## 2022-05-12 DIAGNOSIS — M5459 Other low back pain: Secondary | ICD-10-CM

## 2022-05-12 DIAGNOSIS — R262 Difficulty in walking, not elsewhere classified: Secondary | ICD-10-CM

## 2022-05-12 DIAGNOSIS — M79605 Pain in left leg: Secondary | ICD-10-CM

## 2022-05-12 DIAGNOSIS — M6281 Muscle weakness (generalized): Secondary | ICD-10-CM | POA: Diagnosis not present

## 2022-05-12 NOTE — Therapy (Signed)
OUTPATIENT PHYSICAL THERAPY TREATMENT   Patient Name: Lori Morse MRN: [id number] DOB:09/17/1965, 57 y.o., female Today's Date: 05/12/2022  END OF SESSION:  PT End of Session - 05/12/22 1059     Visit Number 6    Number of Visits 20    Date for PT Re-Evaluation 05/31/22    Authorization Type Insurance cost uncertain on today's visit    Progress Note Due on Visit 10    PT Start Time 1059    PT Stop Time 1138    PT Time Calculation (min) 39 min    Activity Tolerance Patient tolerated treatment well    Behavior During Therapy [facility] for tasks assessed/performed                 Past Medical History:  Diagnosis Date   Bilateral bunions    Depression    Hyperlipidemia    Left sided sciatica 03/2017   resolved   Plantar fasciitis, right    PTSD (post-traumatic stress disorder)    Pulmonary nodules 04/17/2018   Bilateral, noted on CXR   Past Surgical History:  Procedure Laterality Date   BUNIONECTOMY Left 05/16/2019   Procedure: Altamese Atkins WITH METATARSAL OSTEOTOMY;  Surgeon: [name], DPM;  Location: [place];  Service: Podiatry;  Laterality: Left;   BUNIONECTOMY Right 11/22/2019   Procedure: Pauline Good;  Surgeon: Criselda Peaches, DPM;  Location: [place];  Service: Podiatry;  Laterality: Right;  IV SEDATION   HALLUX VALGUS AUSTIN Right 05/17/2018   Procedure: HALLUX VALGUS AUSTIN RIGHT FOOT;  Surgeon: [name], DPM;  Location: [place];  Service: Podiatry;  Laterality: Right;   HAMMER TOE SURGERY Right 11/22/2019   Procedure: HAMMER TOE REPAIR SECOND, THIRD,  FOURTH AND FIFTH TOES RIGHT FOOT;  Surgeon: Criselda Peaches, DPM;  Location: [place];  Service: Podiatry;  Laterality: Right;  IV SEDATION   PLANTAR FASCIA RELEASE Right 05/17/2018   Procedure: PLANTAR FASCIOTOMY RIGHT FOOT;  Surgeon: [name], DPM;  Location: [place];  Service:  Podiatry;  Laterality: Right;   Steroid Injections     foot   Patient Active Problem List   Diagnosis Date Noted   Bilateral bunions 08/28/2018    PCP: Kerin Perna NP  REFERRING PROVIDER: Lanae Crumbly, PA-C  REFERRING DIAG: [phone] (ICD-10-CM) - Acute left-sided low back pain with left-sided sciatica Y99.0 (ICD-10-CM) - Work related injury  Rationale for Evaluation and Treatment: Rehabilitation  THERAPY DIAG:  Other low back pain  Pain in left leg  Muscle weakness (generalized)  Difficulty in walking, not elsewhere classified  ONSET DATE: 12/26/2021  SUBJECTIVE:  SUBJECTIVE STATEMENT: Pt indicated more or less similar to last week was reported upon arrival.  She indicated she took medicine at 6 am and feels better this morning.   Pt indicated Lt leg seems to be more troublesome at times than back but still has complaints in both with work activity.    Communication through interpreter.   PERTINENT HISTORY:  Depression, hyperlipidemia, concurrent plantar fascia related pain.   PAIN:  NPRS scale: no pain upon arrival.  Pain location: low back pain, Lt side Pain description: "pain" tingling in Lt leg at times, constant Aggravating factors: work, lying down, getting out of bed, walking Relieving factors: medicine helps some  PRECAUTIONS: None  WEIGHT BEARING RESTRICTIONS: No  FALLS:  Has patient fallen in last 6 months? No  LIVING ENVIRONMENT: Lives in: House/apartment c trouble Stairs: 2 stair to enter   OCCUPATION: [employer] -  Works shift of 10-12 hour shifts prior to fall.  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   PLOF: Independent, rides bus to get to work which requires walking(home to bus stop 7 mins, 7 mins to  work),  PATIENT GOALS: Reduce pain  Next MD Visit:  PA recommended 4 week follow up on visit on 03/10/2022   OBJECTIVE:   PATIENT SURVEYS:  04/28/2022 : FOTO Update: 44  03/22/2022 FOTO eval: 22   predicted:  51  SCREENING FOR RED FLAGS: 03/22/2022 Bowel or bladder incontinence: No Cauda equina syndrome: No  COGNITION: 03/22/2022 Overall cognitive status: WFL normal      SENSATION: 03/22/2022 Not tested today due to time  MUSCLE LENGTH: 05/12/2022:  Lt supine hamstring: 70 c pain in leg   Rt supine hamstring: 90 c no complaints   03/22/2022 None tested today  POSTURE: 03/22/2022 Reduced lumbar lordosis, rounded shoulders, mild forward trunk lean in standing posture.   PALPATION: 03/22/2022 Tenderness across lower lumbar bilateral, More on Lt.    LUMBAR ROM:   AROM 03/22/2022 03/31/2022 04/28/2022  Flexion Movement to patella c end range pain in back and Lt leg  Movement to ankles with lateral lower leg Lt pain noted, better with several reps  Extension < 25 % c end range pain lumbar (no change in Lt leg symptoms) 50% WFL 50% WFL c mild pain reported lumbar, no leg symptoms change  Repeated x 5 in standing with reduced pain c reps  Right lateral flexion     Left lateral flexion     Right rotation     Left rotation      (Blank rows = not tested)  LOWER EXTREMITY ROM:      Right 03/22/2022 Left 03/22/2022  Hip flexion    Hip extension    Hip abduction    Hip adduction    Hip internal rotation    Hip external rotation    Knee flexion    Knee extension    Ankle dorsiflexion    Ankle plantarflexion    Ankle inversion    Ankle eversion     (Blank rows = not tested)  LOWER EXTREMITY MMT:    MMT Right 03/22/2022 Left 03/22/2022 Left 04/28/2022  Hip flexion 4/5 4/5 c leg pain/back pain 5/5  Hip extension     Hip abduction     Hip adduction     Hip internal rotation     Hip external rotation     Knee flexion 5/5 4/5 c leg pain 5/5 c pain in  lower leg  Knee extension 5/5 4/5 c  leg pain 5/5  Ankle dorsiflexion 5/5 5/5 5/5  Ankle plantarflexion     Ankle inversion     Ankle eversion      (Blank rows = not tested)  LUMBAR SPECIAL TESTS:  05/12/2022:  (-) crossed SLR bilateral for radicular symptom presentation in opposite leg.  03/22/2022 + slump on Lt for concordant Lt leg symptoms  FUNCTIONAL TESTS:  03/22/2022 No specific testing today  GAIT: 03/22/2022 Independent ambulation within clinic, mild forward trunk lean.   TODAY'S TREATMENT:                                                                                                                    DATE: 05/12/2022:  Therex: Supine Lt passive hamstring stretch c strap 30 sec x 5 Supine Lt sciatic nerve flossing active movement (hip in 90 deg c knee extension c PF then knee flexion c DF) 2 x 10  Bridge 5 sec hold x20 c tband blue around knees Supine pball press down 5 sec hold x 15 Supine Clam shell isometric hold contralateral leg blue  band x 20 bilateral  Nustep lvl 6 UE/LE 10 mins  TODAY'S TREATMENT:                                                                                                                    DATE: 05/05/2022:  Therex: Nustep lvl 6 UE/LE 10 mins Leg press double leg 62 lbs 2 x 15 Supine trunk rotation 15 sec x 3 bilateral Bridge 5 sec hold x10 Supine pball press down 5 sec hold x 10  Supine Clam shell isometric hold contralateral leg green band x 20 bilateral  Prone opposite arm/leg lift 3 sec hold x 10 bilateral (mild pain and end)    TODAY'S TREATMENT:                                                                                                                    DATE: 04/28/22:  Therex: Nustep lvl 6 UE/LE 8 mins Standing green  band rows 2 x 15 Standing green band gh ext 2 x 15 Standing lumbar extension x 5, flexion x5  Supine lumbar trunk rotation to tolerance 15 sec x 3 bilateral Bridge 3 sec hold x15  Modalities:  E-stim:  IFC x 10 minutes intensity to tolerance, lumbar paraspinals sitting   PATIENT EDUCATION:  04/28/2022 Education details: HEP updates Person educated: Patient Education method: Explanation, Demonstration, Verbal cues, and Handouts Education comprehension: verbalized understanding, returned demonstration, and verbal cues required  HOME EXERCISE PROGRAM: Access Code: [email or web address] URL: [email or web address] Date: 04/28/2022 Prepared by: Scot Jun  Exercises - Supine Lower Trunk Rotation  - 2-3 x daily - 7 x weekly - 1 sets - 3-5 reps - 15 hold - Hooklying Single Knee to Chest Stretch  - 2-3 x daily - 7 x weekly - 1 sets - 3-5 reps - 15 hold - Standing Lumbar Extension with Counter  - 3-5 x daily - 7 x weekly - 1 sets - 5-10 reps - Standing Bilateral Low Shoulder Row with Anchored Resistance  - 1-2 x daily - 7 x weekly - 2-3 sets - 10-15 reps - Shoulder Extension with Resistance  - 1-2 x daily - 7 x weekly - 2-3 sets - 10-15 reps - Supine Bridge  - 1-2 x daily - 7 x weekly - 1-2 sets - 10 reps - 2 hold - Supine Single Knee to Chest Stretch  - 1-2 x daily - 7 x weekly - 1 sets - 5 reps - 15 hold  ASSESSMENT:  CLINICAL IMPRESSION: Uncertainty of cost of new insurance plan continued to this point.  Symptomatically, improvement noted in back severity.  Lt leg symptoms noted more frequency/stronger at this point than back though both can occur with work activity.   Check today revealed passive SLR on Lt more limited than Rt with symptoms noted in leg.   OBJECTIVE IMPAIRMENTS: decreased activity tolerance, decreased balance, decreased coordination, decreased endurance, decreased mobility, difficulty walking, decreased ROM, decreased strength, hypomobility, increased fascial restrictions, impaired perceived functional ability, increased muscle spasms, impaired flexibility, improper body mechanics, postural dysfunction, and pain.   ACTIVITY LIMITATIONS: carrying, lifting, bending,  sitting, standing, squatting, sleeping, stairs, transfers, bed mobility, and locomotion level  PARTICIPATION LIMITATIONS: meal prep, cleaning, laundry, interpersonal relationship, shopping, community activity, and occupation  PERSONAL FACTORS: Transportation and pain severity, work environment  are also affecting patient's functional outcome.   REHAB POTENTIAL: Good  CLINICAL DECISION MAKING: Stable/uncomplicated  EVALUATION COMPLEXITY: Low   GOALS: Goals reviewed with patient? Yes  SHORT TERM GOALS: (target date for Short term goals are 3 weeks 04/12/2022)  1. Patient will demonstrate independent use of home exercise program to maintain progress from in clinic treatments.  Goal status: MET 04/04/22  LONG TERM GOALS: (target dates for all long term goals are 10 weeks  05/31/2022 )   1. Patient will demonstrate/report pain at worst less than or equal to 2/10 to facilitate minimal limitation in daily activity secondary to pain symptoms.  Goal status: on going 04/28/2022   2. Patient will demonstrate independent use of home exercise program to facilitate ability to maintain/progress functional gains from skilled physical therapy services.  Goal status: on going 04/28/2022   3. Patient will demonstrate FOTO outcome > or = 51 % to indicate reduced disability due to condition.  Goal status: on going 04/28/2022   4. Patient will demonstrate lumbar extension 100 % WFL s symptoms to facilitate upright standing, walking posture at PLOF s limitation.  Goal status: on  going 04/28/2022   5.  Patient will demonstrate bilateral LE MMT 5/5 to facilitate ability to perform work and house activity including standing, walking and lifting.  Goal status: on going 04/28/2022   6.  Patient will demonstrate ability to perform work at [employer].  Goal status: on going 04/28/2022     PLAN:  PT FREQUENCY: 1-2x/week  PT DURATION: 10 weeks  PLANNED INTERVENTIONS: Therapeutic exercises, Therapeutic  activity, Neuro Muscular re-education, Balance training, Gait training, Patient/Family education, Joint mobilization, Stair training, DME instructions, Dry Needling, Electrical stimulation, Cryotherapy, vasopneumatic device, Moist heat, Taping, Traction Ultrasound, Ionotophoresis '4mg'$ /ml Dexamethasone, and Manual therapy.  All included unless contraindicated  PLAN FOR NEXT SESSION: Recheck hamstring mobility and hip strength.    Scot Jun, PT, DPT, OCS, ATC 05/12/22  11:35 AM

## 2022-05-19 ENCOUNTER — Ambulatory Visit (INDEPENDENT_AMBULATORY_CARE_PROVIDER_SITE_OTHER): Payer: 59 | Admitting: Rehabilitative and Restorative Service Providers"

## 2022-05-19 ENCOUNTER — Encounter: Payer: Self-pay | Admitting: Rehabilitative and Restorative Service Providers"

## 2022-05-19 ENCOUNTER — Encounter: Payer: Commercial Managed Care - HMO | Admitting: Rehabilitative and Restorative Service Providers"

## 2022-05-19 DIAGNOSIS — M6281 Muscle weakness (generalized): Secondary | ICD-10-CM

## 2022-05-19 DIAGNOSIS — R262 Difficulty in walking, not elsewhere classified: Secondary | ICD-10-CM

## 2022-05-19 DIAGNOSIS — M5459 Other low back pain: Secondary | ICD-10-CM | POA: Diagnosis not present

## 2022-05-19 DIAGNOSIS — M79605 Pain in left leg: Secondary | ICD-10-CM

## 2022-05-19 NOTE — Therapy (Addendum)
OUTPATIENT PHYSICAL THERAPY TREATMENT   Patient Name: Lori Morse MRN: [id number] DOB:19-May-1965, 57 y.o., female Today's Date: 05/19/2022  PHYSICAL THERAPY DISCHARGE SUMMARY  Visits from Start of Care: 7  Current functional level related to goals / functional outcomes: See note   Remaining deficits: See note   Education / Equipment: HEP   Patient agrees to discharge. Patient goals were partially met. Patient is being discharged due to not returning since the last visit.   END OF SESSION:  PT End of Session - 05/19/22 1135     Visit Number 7    Number of Visits 20    Date for PT Re-Evaluation 05/31/22    Authorization Type Insurance cost uncertain on today's visit    Progress Note Due on Visit 10    PT Start Time 1056    PT Stop Time 1136    PT Time Calculation (min) 40 min    Activity Tolerance Patient tolerated treatment well    Behavior During Therapy [facility] for tasks assessed/performed                  Past Medical History:  Diagnosis Date   Bilateral bunions    Depression    Hyperlipidemia    Left sided sciatica 03/2017   resolved   Plantar fasciitis, right    PTSD (post-traumatic stress disorder)    Pulmonary nodules 04/17/2018   Bilateral, noted on CXR   Past Surgical History:  Procedure Laterality Date   BUNIONECTOMY Left 05/16/2019   Procedure: Altamese [place] WITH METATARSAL OSTEOTOMY;  Surgeon: [name], DPM;  Location: [place];  Service: Podiatry;  Laterality: Left;   BUNIONECTOMY Right 11/22/2019   Procedure: Pauline Good;  Surgeon: Criselda Peaches, DPM;  Location: [place];  Service: Podiatry;  Laterality: Right;  IV SEDATION   HALLUX VALGUS AUSTIN Right 05/17/2018   Procedure: HALLUX VALGUS [place]USTIN RIGHT FOOT;  Surgeon: [name], DPM;  Location: [place];  Service: Podiatry;  Laterality: Right;   HAMMER TOE SURGERY Right 11/22/2019   Procedure: HAMMER TOE  REPAIR SECOND, THIRD,  FOURTH AND FIFTH TOES RIGHT FOOT;  Surgeon: Criselda Peaches, DPM;  Location: [place];  Service: Podiatry;  Laterality: Right;  IV SEDATION   PLANTAR FASCIA RELEASE Right 05/17/2018   Procedure: PLANTAR FASCIOTOMY RIGHT FOOT;  Surgeon: [name], DPM;  Location: [place];  Service: Podiatry;  Laterality: Right;   Steroid Injections     foot   Patient Active Problem List   Diagnosis Date Noted   Bilateral bunions 08/28/2018    PCP: Kerin Perna NP  REFERRING PROVIDER: Lanae Crumbly, PA-C  REFERRING DIAG: [phone] (ICD-10-CM) - Acute left-sided low back pain with left-sided sciatica Y99.0 (ICD-10-CM) - Work related injury  Rationale for Evaluation and Treatment: Rehabilitation  THERAPY DIAG:  Other low back pain  Pain in left leg  Muscle weakness (generalized)  Difficulty in walking, not elsewhere classified  ONSET DATE: 12/26/2021  SUBJECTIVE:  SUBJECTIVE STATEMENT: She reported similar symptoms with work.  She stated medicine helps a lot and she still is working on [employer].  Complaints when they occur are in back and Lt leg still.   She also spent time talking about her Lt shoulder that was a chronic pain issue since a weight box fell on her shoulder while working "years ago".    Communication through interpreter.   PERTINENT HISTORY:  Depression, hyperlipidemia, concurrent plantar fascia related pain.   PAIN:  NPRS scale: no pain upon arrival.  Pain location: low back pain, Lt side Pain description: "pain" tingling in Lt leg at times, constant Aggravating factors: work, lying down, getting out of bed, walking Relieving factors: medicine helps some  PRECAUTIONS: None  WEIGHT BEARING RESTRICTIONS: No  FALLS:  Has  patient fallen in last 6 months? No  LIVING ENVIRONMENT: Lives in: House/apartment c trouble Stairs: 2 stair to enter   OCCUPATION: [employer] -  Works shift of 10-12 hour shifts prior to fall.  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   PLOF: Independent, rides bus to get to work which requires walking(home to bus stop 7 mins, 7 mins to work),  PATIENT GOALS: Reduce pain  Next MD Visit:  PA recommended 4 week follow up on visit on 03/10/2022   OBJECTIVE:   PATIENT SURVEYS:  04/28/2022 : FOTO Update: 44  03/22/2022 FOTO eval: 22   predicted:  51  SCREENING FOR RED FLAGS: 03/22/2022 Bowel or bladder incontinence: No Cauda equina syndrome: No  COGNITION: 03/22/2022 Overall cognitive status: WFL normal      SENSATION: 03/22/2022 Not tested today due to time  MUSCLE LENGTH: 05/12/2022:  Lt supine hamstring: 70 c pain in leg   Rt supine hamstring: 90 c no complaints   03/22/2022 None tested today  POSTURE: 03/22/2022 Reduced lumbar lordosis, rounded shoulders, mild forward trunk lean in standing posture.   PALPATION: 03/22/2022 Tenderness across lower lumbar bilateral, More on Lt.    LUMBAR ROM:   AROM 03/22/2022 03/31/2022 04/28/2022 05/19/2022  Flexion Movement to patella c end range pain in back and Lt leg  Movement to ankles with lateral lower leg Lt pain noted, better with several reps   Extension < 25 % c end range pain lumbar (no change in Lt leg symptoms) 50% WFL 50% WFL c mild pain reported lumbar, no leg symptoms change  Repeated x 5 in standing with reduced pain c reps 75 % WFL c mild end range pain in Lt lumbar.    Right lateral flexion      Left lateral flexion      Right rotation      Left rotation       (Blank rows = not tested)  LOWER EXTREMITY ROM:      Right 03/22/2022 Left 03/22/2022  Hip flexion    Hip extension    Hip abduction    Hip adduction    Hip internal rotation    Hip external  rotation    Knee flexion    Knee extension    Ankle dorsiflexion    Ankle plantarflexion    Ankle inversion    Ankle eversion     (Blank rows = not tested)  LOWER EXTREMITY MMT:    MMT Right 03/22/2022 Left 03/22/2022 Left 04/28/2022  Hip flexion 4/5 4/5 c leg pain/back pain 5/5  Hip extension     Hip abduction     Hip adduction     Hip internal rotation  Hip external rotation     Knee flexion 5/5 4/5 c leg pain 5/5 c pain in lower leg  Knee extension 5/5 4/5 c leg pain 5/5  Ankle dorsiflexion 5/5 5/5 5/5  Ankle plantarflexion     Ankle inversion     Ankle eversion      (Blank rows = not tested)  LUMBAR SPECIAL TESTS:  05/12/2022:  (-) crossed SLR bilateral for radicular symptom presentation in opposite leg.  03/22/2022 + slump on Lt for concordant Lt leg symptoms  FUNCTIONAL TESTS:  03/22/2022 No specific testing today  GAIT: 03/22/2022 Independent ambulation within clinic, mild forward trunk lean.   TODAY'S TREATMENT:                                                                                                                    DATE: 05/19/2022:  Therex: Nustep lvl 6 UE/LE 10 mins Standing lumbar extension AROM 2 x 5 Sit to stand to sit 18 inch table height no UE assist x 10  Supine Lt passive hamstring stretch c strap 30 sec x 5 Supine Lt sciatic nerve flossing active movement (hip in 90 deg c knee extension c PF then knee flexion c DF) 2 x 10  Standing green band rows 2 x 15 Standing green band GH ext 2 x 15   TODAY'S TREATMENT:                                                                                                                    DATE: 05/12/2022:  Therex: Supine Lt passive hamstring stretch c strap 30 sec x 5 Supine Lt sciatic nerve flossing active movement (hip in 90 deg c knee extension c PF then knee flexion c DF) 2 x 10  Bridge 5 sec hold x20 c tband blue around knees Supine pball press down 5 sec hold x 15 Supine Clam shell  isometric hold contralateral leg blue  band x 20 bilateral  Nustep lvl 6 UE/LE 10 mins  TODAY'S TREATMENT:  DATE: 05/05/2022:  Therex: Nustep lvl 6 UE/LE 10 mins Leg press double leg 62 lbs 2 x 15 Supine trunk rotation 15 sec x 3 bilateral Bridge 5 sec hold x10 Supine pball press down 5 sec hold x 10  Supine Clam shell isometric hold contralateral leg green band x 20 bilateral  Prone opposite arm/leg lift 3 sec hold x 10 bilateral (mild pain and end)    TODAY'S TREATMENT:                                                                                                                    DATE: 04/28/22:  Therex: Nustep lvl 6 UE/LE 8 mins Standing green band rows 2 x 15 Standing green band gh ext 2 x 15 Standing lumbar extension x 5, flexion x5  Supine lumbar trunk rotation to tolerance 15 sec x 3 bilateral Bridge 3 sec hold x15  Modalities:  E-stim: IFC x 10 minutes intensity to tolerance, lumbar paraspinals sitting   PATIENT EDUCATION:  04/28/2022 Education details: HEP updates Person educated: Patient Education method: Explanation, Demonstration, Verbal cues, and Handouts Education comprehension: verbalized understanding, returned demonstration, and verbal cues required  HOME EXERCISE PROGRAM: Access Code: [email or web address] URL: [email or web address] Date: 04/28/2022 Prepared by: Scot Jun  Exercises - Supine Lower Trunk Rotation  - 2-3 x daily - 7 x weekly - 1 sets - 3-5 reps - 15 hold - Hooklying Single Knee to Chest Stretch  - 2-3 x daily - 7 x weekly - 1 sets - 3-5 reps - 15 hold - Standing Lumbar Extension with Counter  - 3-5 x daily - 7 x weekly - 1 sets - 5-10 reps - Standing Bilateral Low Shoulder Row with Anchored Resistance  - 1-2 x daily - 7 x weekly - 2-3 sets - 10-15 reps - Shoulder Extension with Resistance  - 1-2 x daily - 7 x weekly -  2-3 sets - 10-15 reps - Supine Bridge  - 1-2 x daily - 7 x weekly - 1-2 sets - 10 reps - 2 hold - Supine Single Knee to Chest Stretch  - 1-2 x daily - 7 x weekly - 1 sets - 5 reps - 15 hold  ASSESSMENT:  CLINICAL IMPRESSION: Lumbar mobility improved compared to previous.  Continued complaint increase without medicine use but tolerance to activity seems to be improving overall.  Continued limits reported in work activity.  Pt did mention at end of session that he might be covered under workman's compensation perhaps?  Uncertain at this time.   OBJECTIVE IMPAIRMENTS: decreased activity tolerance, decreased balance, decreased coordination, decreased endurance, decreased mobility, difficulty walking, decreased ROM, decreased strength, hypomobility, increased fascial restrictions, impaired perceived functional ability, increased muscle spasms, impaired flexibility, improper body mechanics, postural dysfunction, and pain.   ACTIVITY LIMITATIONS: carrying, lifting, bending, sitting, standing, squatting, sleeping, stairs, transfers, bed mobility, and locomotion level  PARTICIPATION LIMITATIONS: meal prep, cleaning, laundry, interpersonal relationship, shopping, community activity, and occupation  PERSONAL FACTORS: Transportation and pain severity, work environment  are  also affecting patient's functional outcome.   REHAB POTENTIAL: Good  CLINICAL DECISION MAKING: Stable/uncomplicated  EVALUATION COMPLEXITY: Low   GOALS: Goals reviewed with patient? Yes  SHORT TERM GOALS: (target date for Short term goals are 3 weeks 04/12/2022)  1. Patient will demonstrate independent use of home exercise program to maintain progress from in clinic treatments.  Goal status: MET 04/04/22  LONG TERM GOALS: (target dates for all long term goals are 10 weeks  05/31/2022 )   1. Patient will demonstrate/report pain at worst less than or equal to 2/10 to facilitate minimal limitation in daily activity secondary  to pain symptoms.  Goal status: on going 05/19/2022   2. Patient will demonstrate independent use of home exercise program to facilitate ability to maintain/progress functional gains from skilled physical therapy services.  Goal status: on going 05/19/2022   3. Patient will demonstrate FOTO outcome > or = 51 % to indicate reduced disability due to condition.  Goal status: on going 05/19/2022   4. Patient will demonstrate lumbar extension 100 % WFL s symptoms to facilitate upright standing, walking posture at PLOF s limitation.  Goal status: on going 05/19/2022   5.  Patient will demonstrate bilateral LE MMT 5/5 to facilitate ability to perform work and house activity including standing, walking and lifting.  Goal status: on going 05/19/2022   6.  Patient will demonstrate ability to perform work at [employer].  Goal status: on going 05/19/2022     PLAN:  PT FREQUENCY: 1-2x/week  PT DURATION: 10 weeks  PLANNED INTERVENTIONS: Therapeutic exercises, Therapeutic activity, Neuro Muscular re-education, Balance training, Gait training, Patient/Family education, Joint mobilization, Stair training, DME instructions, Dry Needling, Electrical stimulation, Cryotherapy, vasopneumatic device, Moist heat, Taping, Traction Ultrasound, Ionotophoresis '4mg'$ /ml Dexamethasone, and Manual therapy.  All included unless contraindicated  PLAN FOR NEXT SESSION: Continue progress in lumbar mobility and overall strength.    Scot Jun, PT, DPT, OCS, ATC 05/19/22  11:39 AM  DC note only Farley Ly PT, MPT

## 2022-05-26 ENCOUNTER — Encounter: Payer: Commercial Managed Care - HMO | Admitting: Rehabilitative and Restorative Service Providers"

## 2022-06-02 ENCOUNTER — Ambulatory Visit (INDEPENDENT_AMBULATORY_CARE_PROVIDER_SITE_OTHER): Payer: Commercial Managed Care - HMO | Admitting: Primary Care

## 2022-06-28 ENCOUNTER — Encounter: Payer: Self-pay | Admitting: Orthopaedic Surgery

## 2022-06-28 ENCOUNTER — Other Ambulatory Visit: Payer: Self-pay

## 2022-06-28 ENCOUNTER — Ambulatory Visit (INDEPENDENT_AMBULATORY_CARE_PROVIDER_SITE_OTHER): Payer: No Typology Code available for payment source | Admitting: Orthopaedic Surgery

## 2022-06-28 VITALS — BP 126/76 | HR 69 | Ht 62.0 in | Wt 180.0 lb

## 2022-06-28 DIAGNOSIS — M25562 Pain in left knee: Secondary | ICD-10-CM

## 2022-06-28 DIAGNOSIS — M549 Dorsalgia, unspecified: Secondary | ICD-10-CM | POA: Diagnosis not present

## 2022-06-28 DIAGNOSIS — S300XXD Contusion of lower back and pelvis, subsequent encounter: Secondary | ICD-10-CM | POA: Diagnosis not present

## 2022-06-28 DIAGNOSIS — G8929 Other chronic pain: Secondary | ICD-10-CM | POA: Diagnosis not present

## 2022-06-28 DIAGNOSIS — M25561 Pain in right knee: Secondary | ICD-10-CM | POA: Diagnosis not present

## 2022-06-28 DIAGNOSIS — S300XXA Contusion of lower back and pelvis, initial encounter: Secondary | ICD-10-CM | POA: Insufficient documentation

## 2022-06-28 MED ORDER — IBUPROFEN 600 MG PO TABS
600.0000 mg | ORAL_TABLET | Freq: Four times a day (QID) | ORAL | 0 refills | Status: AC | PRN
Start: 2022-06-28 — End: ?
  Filled 2022-06-28 – 2022-12-30 (×2): qty 90, 23d supply, fill #0

## 2022-06-28 MED ORDER — CYCLOBENZAPRINE HCL 10 MG PO TABS
10.0000 mg | ORAL_TABLET | Freq: Two times a day (BID) | ORAL | 0 refills | Status: AC | PRN
  Filled 2022-06-28: qty 90, 45d supply, fill #0
  Filled 2023-05-10: qty 60, 30d supply, fill #0

## 2022-06-28 NOTE — Progress Notes (Signed)
Office Visit Note   Patient: Lori Morse           Date of Birth: 12-08-1965           MRN: [id number] Visit Date: 06/28/2022              Requested by: Kerin Perna, NP [address] [place],  [place] [address] PCP: Kerin Perna, NP   Assessment & Plan: Visit Diagnoses:  1. Acute back pain greater than 3 months duration   2. Acute pain of both knees     Plan: Reviewed previous radiographs including flexion-extension which are normal.  She is neurologically intact on exam.  She can continue her home exercises that was instructed to her by therapy continue daily walking.  Final refill sent in for ibuprofen and Flexeril.  Patient is released from care.  Follow-Up Instructions: Return if symptoms worsen or fail to improve.   Orders:  No orders of the defined types were placed in this encounter.  Meds ordered this encounter  Medications   cyclobenzaprine (FLEXERIL) 10 MG tablet    Sig: Take 1 tablet (10 mg total) by mouth 2 (two) times daily as needed for muscle spasms.    Dispense:  90 tablet    Refill:  0   ibuprofen (ADVIL) 600 MG tablet    Sig: Take 1 tablet (600 mg total) by mouth every 6 (six) hours as needed.    Dispense:  90 tablet    Refill:  0      Procedures: No procedures performed   Clinical Data: No additional findings.   Subjective: Chief Complaint  Patient presents with   Lower Back - Pain    OTJI 12/26/2021    HPI 57 year old female returns for follow-up after on-the-job injury when she fell backwards off a step and [employer].  Chart listed her Worker's Comp. claim was denied.  She is been through 7 physical therapy visits states she has pain at thoracic region radiates to the lumbar region goes down her left leg to her foot.  She has gotten improvement with therapy also ibuprofen.  She walks daily and has to walk to the bus stop to get through to work.  Additional problems include cholesterol and she takes  Cymbalta.  Review of Systems no associated bowel or bladder symptoms no fever or chills.  All other systems noncontributory HPI.   Objective: Vital Signs: BP 126/76   Pulse 69   Ht '5\' 2"'$  (1.575 m)   Wt 180 lb (81.6 kg)   LMP 09/02/2018 (Exact Date)   BMI 32.92 kg/m   Physical Exam Constitutional:      Appearance: She is well-developed.  HENT:     Head: Normocephalic.     Right Ear: External ear normal.     Left Ear: External ear normal. There is no impacted cerumen.  Eyes:     Pupils: Pupils are equal, round, and reactive to light.  Neck:     Thyroid: No thyromegaly.     Trachea: No tracheal deviation.  Cardiovascular:     Rate and Rhythm: Normal rate.  Pulmonary:     Effort: Pulmonary effort is normal.  Abdominal:     Palpations: Abdomen is soft.  Musculoskeletal:     Cervical back: No rigidity.  Skin:    General: Skin is warm and dry.  Neurological:     Mental Status: She is alert and oriented to person, place, and time.  Psychiatric:  Behavior: Behavior normal.     Ortho Exam extremity raising negative popliteal compression test negative logroll the hips anterior tib EHL gastrocsoleus is normal normal heel-toe gait no limping.  Specialty Comments:  No specialty comments available.  Imaging: No results found.   PMFS History: Patient Active Problem List   Diagnosis Date Noted   Bilateral bunions 08/28/2018   Past Medical History:  Diagnosis Date   Bilateral bunions    Depression    Hyperlipidemia    Left sided sciatica 03/2017   resolved   Plantar fasciitis, right    PTSD (post-traumatic stress disorder)    Pulmonary nodules 04/17/2018   Bilateral, noted on CXR    No family history on file.  Past Surgical History:  Procedure Laterality Date   BUNIONECTOMY Left 05/16/2019   Procedure: Altamese [place] WITH METATARSAL OSTEOTOMY;  Surgeon: [name], DPM;  Location: [place];  Service: Podiatry;  Laterality: Left;    BUNIONECTOMY Right 11/22/2019   Procedure: Pauline Good;  Surgeon: Criselda Peaches, DPM;  Location: [place];  Service: Podiatry;  Laterality: Right;  IV SEDATION   HALLUX VALGUS AUSTIN Right 05/17/2018   Procedure: HALLUX VALGUS [place] RIGHT FOOT;  Surgeon: [name], DPM;  Location: [place];  Service: Podiatry;  Laterality: Right;   HAMMER TOE SURGERY Right 11/22/2019   Procedure: HAMMER TOE REPAIR SECOND, THIRD,  FOURTH AND FIFTH TOES RIGHT FOOT;  Surgeon: Criselda Peaches, DPM;  Location: [place];  Service: Podiatry;  Laterality: Right;  IV SEDATION   PLANTAR FASCIA RELEASE Right 05/17/2018   Procedure: PLANTAR FASCIOTOMY RIGHT FOOT;  Surgeon: [name], DPM;  Location: [place];  Service: Podiatry;  Laterality: Right;   Steroid Injections     foot   Social History   Occupational History   Not on file  Tobacco Use   Smoking status: Never   Smokeless tobacco: Never  Vaping Use   Vaping Use: Never used  Substance and Sexual Activity   Alcohol use: Never   Drug use: Never   Sexual activity: Yes    Birth control/protection: Post-menopausal

## 2022-07-05 ENCOUNTER — Other Ambulatory Visit: Payer: Self-pay

## 2022-07-11 ENCOUNTER — Ambulatory Visit: Payer: Commercial Managed Care - HMO | Admitting: Physician Assistant

## 2022-11-16 ENCOUNTER — Telehealth: Payer: Self-pay | Admitting: Primary Care

## 2022-11-16 NOTE — Telephone Encounter (Signed)
Copied from CRM [id number]. Topic: General - Other >> Nov 16, 2022  4:02 PM Epimenio Foot F wrote: Reason for CRM: Bobbi with [facility] is calling in to check the status of a medical records request she sent over back in June.

## 2022-11-17 NOTE — Telephone Encounter (Signed)
Attempt to call office, unable to reach anyone, just music playing while waiting for representative to answer call.

## 2022-11-21 NOTE — Telephone Encounter (Signed)
Reached out to the medical records dept no answer lvm. Not able to give an update at this

## 2022-12-28 ENCOUNTER — Ambulatory Visit (HOSPITAL_COMMUNITY)
Admission: EM | Admit: 2022-12-28 | Discharge: 2022-12-28 | Disposition: A | Discharge status: Home / Self Care | Payer: Self-pay | Attending: Family Medicine | Admitting: Family Medicine

## 2022-12-28 ENCOUNTER — Ambulatory Visit (INDEPENDENT_AMBULATORY_CARE_PROVIDER_SITE_OTHER): Payer: Self-pay

## 2022-12-28 ENCOUNTER — Encounter (HOSPITAL_COMMUNITY): Payer: Self-pay | Admitting: Emergency Medicine

## 2022-12-28 ENCOUNTER — Other Ambulatory Visit: Payer: Self-pay

## 2022-12-28 DIAGNOSIS — S86912A Strain of unspecified muscle(s) and tendon(s) at lower leg level, left leg, initial encounter: Secondary | ICD-10-CM

## 2022-12-28 MED ORDER — DEXAMETHASONE SODIUM PHOSPHATE 10 MG/ML IJ SOLN
INTRAMUSCULAR | Status: AC
  Filled 2022-12-28: qty 1

## 2022-12-28 MED ORDER — KETOROLAC TROMETHAMINE 30 MG/ML IJ SOLN
INTRAMUSCULAR | Status: AC
  Filled 2022-12-28: qty 1

## 2022-12-28 MED ORDER — DEXAMETHASONE SODIUM PHOSPHATE 10 MG/ML IJ SOLN
10.0000 mg | Freq: Once | INTRAMUSCULAR | Status: AC
  Administered 2022-12-28: 10 mg via INTRAMUSCULAR

## 2022-12-28 MED ORDER — KETOROLAC TROMETHAMINE 30 MG/ML IJ SOLN
30.0000 mg | Freq: Once | INTRAMUSCULAR | Status: AC
  Administered 2022-12-28: 30 mg via INTRAMUSCULAR

## 2022-12-28 MED ORDER — PREDNISONE 20 MG PO TABS
20.0000 mg | ORAL_TABLET | Freq: Every day | ORAL | 0 refills | Status: AC
  Filled 2022-12-28: qty 5, 5d supply, fill #0

## 2022-12-28 NOTE — ED Provider Notes (Signed)
[facility]    CSN: [id number] Arrival date & time: 12/28/22  8295      History   Chief Complaint Chief Complaint  Patient presents with   Knee Pain    HPI Lori Morse is a 57 y.o. female.  Patient today with left knee pain x 4 days.  Patient reports a distant injury which occurred when she fell over a year ago while working and additionally injured her left knee.  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  She reports no recent falls just experiencing pain to the point that she was unable to walk x 1 day ago.  She has been taking Aleve, diclofenac and cyclobenzaprine only for pain without any relief.  She also has back problems which she has been going to physical therapy.  Patient reports that she has been taking for pain has not helped.  Continues to have left-sided low back pain which is also radiating down her left hip into her left knee which is making ambulating difficult due to the level of pain. Past Medical History:  Diagnosis Date   Bilateral bunions    Depression    Hyperlipidemia    Left sided sciatica 03/2017   resolved   Plantar fasciitis, right    PTSD (post-traumatic stress disorder)    Pulmonary nodules 04/17/2018   Bilateral, noted on CXR    Patient Active Problem List   Diagnosis Date Noted   Lumbar contusion 06/28/2022   Bilateral bunions 08/28/2018    Past Surgical History:  Procedure Laterality Date   BUNIONECTOMY Left 05/16/2019   Procedure: Serafina Royals WITH METATARSAL OSTEOTOMY;  Surgeon: Felecia Shelling, DPM;  Location: [facility];  Service: Podiatry;  Laterality: Left;   BUNIONECTOMY Right 11/22/2019   Procedure: Alexia Freestone;  Surgeon: Edwin Cap, DPM;  Location: [facility];  Service: Podiatry;  Laterality: Right;  IV SEDATION   HALLUX VALGUS AUSTIN Right 05/17/2018   Procedure: HALLUX VALGUS AUSTIN RIGHT FOOT;   Surgeon: Felecia Shelling, DPM;  Location: [facility];  Service: Podiatry;  Laterality: Right;   HAMMER TOE SURGERY Right 11/22/2019   Procedure: HAMMER TOE REPAIR SECOND, THIRD,  FOURTH AND FIFTH TOES RIGHT FOOT;  Surgeon: Edwin Cap, DPM;  Location: [facility];  Service: Podiatry;  Laterality: Right;  IV SEDATION   PLANTAR FASCIA RELEASE Right 05/17/2018   Procedure: PLANTAR FASCIOTOMY RIGHT FOOT;  Surgeon: Felecia Shelling, DPM;  Location: [facility];  Service: Podiatry;  Laterality: Right;   Steroid Injections     foot    OB History   No obstetric history on file.      Home Medications    Prior to Admission medications   Medication Sig Start Date End Date Taking? Authorizing Provider  cyclobenzaprine (FLEXERIL) 10 MG tablet Take 1 tablet (10 mg total) by mouth 2 (two) times daily as needed for muscle spasms. 06/28/22  Yes Eldred Manges, MD  DULoxetine (CYMBALTA) 30 MG capsule Take 1 capsule (30 mg total) by mouth daily. 03/01/22  Yes Grayce Sessions, NP  ibuprofen (ADVIL) 600 MG tablet Take 1 tablet (600 mg total) by mouth every 6 (six) hours as needed. 06/28/22  Yes Eldred Manges, MD  pravastatin (PRAVACHOL) 20 MG tablet Take 1 tablet (20 mg total) by mouth daily. 03/01/22  Yes Grayce Sessions, NP  predniSONE (DELTASONE)  20 MG tablet Take 1 tablet (20 mg total) by mouth daily with breakfast for 5 days. 12/28/22 01/02/23 Yes Bing Neighbors, NP    Family History History reviewed. No pertinent family history.  Social History Social History   Tobacco Use   Smoking status: Never   Smokeless tobacco: Never  Vaping Use   Vaping status: Never Used  Substance Use Topics   Alcohol use: Never   Drug use: Never     Allergies   Patient has no known allergies.   Review of Systems Review of Systems Pertinent negatives listed in HPI   Physical Exam Triage Vital Signs ED Triage Vitals  Encounter Vitals Group     BP  12/28/22 0849 114/61     Systolic BP Percentile --      Diastolic BP Percentile --      Pulse Rate 12/28/22 0849 71     Resp 12/28/22 0849 16     Temp 12/28/22 0849 98.5 F (36.9 C)     Temp Source 12/28/22 0849 Oral     SpO2 12/28/22 0849 96 %     Weight --      Height --      Head Circumference --      Peak Flow --      Pain Score 12/28/22 0851 10     Pain Loc --      Pain Education --      Exclude from Growth Chart --     Updated Vital Signs BP 114/61 (BP Location: Right Arm)   Pulse 71   Temp 98.5 F (36.9 C) (Oral)   Resp 16   LMP 09/02/2018 (Exact Date)   SpO2 96%   Visual Acuity Right Eye Distance:   Left Eye Distance:   Bilateral Distance:    Right Eye Near:   Left Eye Near:    Bilateral Near:     Physical Exam Constitutional:      Appearance: Normal appearance.  HENT:     Head: Normocephalic and atraumatic.     Nose: Nose normal.  Eyes:     Extraocular Movements: Extraocular movements intact.     Conjunctiva/sclera: Conjunctivae normal.     Pupils: Pupils are equal, round, and reactive to light.  Cardiovascular:     Rate and Rhythm: Normal rate and regular rhythm.  Pulmonary:     Effort: Pulmonary effort is normal.     Breath sounds: Normal breath sounds.  Musculoskeletal:     Right knee: Normal. No swelling.     Left knee: No swelling. Tenderness present over the medial joint line, lateral joint line, PCL and patellar tendon.  Neurological:     General: No focal deficit present.     Mental Status: She is alert.      UC Treatments / Results  Labs (all labs ordered are listed, but only abnormal results are displayed) Labs Reviewed - No data to display  EKG   Radiology DG Knee Complete 4 Views Left  Result Date: 12/28/2022 CLINICAL DATA:  Left knee pain, remote prior injury EXAM: LEFT KNEE - COMPLETE 4+ VIEW COMPARISON:  None Available. FINDINGS: No evidence of fracture, dislocation, or joint effusion. No evidence of arthropathy or other  focal bone abnormality. Soft tissues are unremarkable. IMPRESSION: No fracture or dislocation of the left knee. Joint spaces are preserved. Electronically Signed   By: Jearld Lesch M.D.   On: 12/28/2022 10:12    Procedures Procedures (including critical care time)  Medications Ordered  in UC Medications  ketorolac (TORADOL) 30 MG/ML injection 30 mg (30 mg Intramuscular Given 12/28/22 1112)  dexamethasone (DECADRON) injection 10 mg (10 mg Intramuscular Given 12/28/22 1111)    Initial Impression / Assessment and Plan / UC Course  I have reviewed the triage vital signs and the nursing notes.  Pertinent labs & imaging results that were available during my care of the patient were reviewed by me and considered in my medical decision making (see chart for details).    Imaging unremarkable for any degenerative changes or acute changes such as a deformity or fracture.  Suspect knee pain is related to muscle strain as there is no visible soft tissue swelling.  Patient treated today with a Decadron and Toradol injection.  Patient will continue oral prednisone starting tomorrow 20 mg once daily for 5 days. Encouraged to follow-up with orthopedic specialist if symptoms worsen or do not improve  Final Clinical Impressions(s) / UC Diagnoses   Final diagnoses:  Strain of left knee, initial encounter     Discharge Instructions      Imaging of your left knee shows no chronic degenerative disease, dislocation or fracture.  Suspect you are suffering from muscle strain involving the knee. Start prednisone tomorrow (you received a steroid injection today). You may continue your cyclobenzaprine as needed for pain however hold diclofenac while taking prednisone or any other NSAID such as naproxen or ibuprofen as it can cause the prednisone not to work as effectively.  I have included follow-up information if your symptoms worsen or do not improve.     ED Prescriptions     Medication Sig Dispense Auth.  Provider   predniSONE (DELTASONE) 20 MG tablet Take 1 tablet (20 mg total) by mouth daily with breakfast for 5 days. 5 tablet Bing Neighbors, NP      PDMP not reviewed this encounter.   Bing Neighbors, NP 12/29/22 [phone]

## 2022-12-28 NOTE — Discharge Instructions (Addendum)
Imaging of your left knee shows no chronic degenerative disease, dislocation or fracture.  Suspect you are suffering from muscle strain involving the knee. Start prednisone tomorrow (you received a steroid injection today). You may continue your cyclobenzaprine as needed for pain however hold diclofenac while taking prednisone or any other NSAID such as naproxen or ibuprofen as it can cause the prednisone not to work as effectively.  I have included follow-up information if your symptoms worsen or do not improve.

## 2022-12-28 NOTE — ED Triage Notes (Signed)
Patient c/o left knee pain x 4 days ago while working.  Original injury was over a year ago while working.  Patient has pain on and off.  Patient is taken Aleve, Diclofenac 75mg  and Cyclobenzaprine 10mg .

## 2022-12-30 ENCOUNTER — Other Ambulatory Visit: Payer: Self-pay

## 2023-04-20 ENCOUNTER — Encounter (INDEPENDENT_AMBULATORY_CARE_PROVIDER_SITE_OTHER): Payer: Self-pay | Admitting: Primary Care

## 2023-04-20 ENCOUNTER — Ambulatory Visit (INDEPENDENT_AMBULATORY_CARE_PROVIDER_SITE_OTHER): Payer: 59 | Admitting: Primary Care

## 2023-04-20 ENCOUNTER — Other Ambulatory Visit (HOSPITAL_COMMUNITY)
Admission: RE | Admit: 2023-04-20 | Discharge: 2023-04-20 | Disposition: A | Discharge status: Home / Self Care | Payer: 59 | Source: Ambulatory Visit | Attending: Primary Care | Admitting: Primary Care

## 2023-04-20 VITALS — BP 121/78 | HR 62 | Resp 16 | Wt 193.6 lb

## 2023-04-20 DIAGNOSIS — B9689 Other specified bacterial agents as the cause of diseases classified elsewhere: Secondary | ICD-10-CM | POA: Diagnosis not present

## 2023-04-20 DIAGNOSIS — R7303 Prediabetes: Secondary | ICD-10-CM

## 2023-04-20 DIAGNOSIS — Z113 Encounter for screening for infections with a predominantly sexual mode of transmission: Secondary | ICD-10-CM | POA: Diagnosis not present

## 2023-04-20 DIAGNOSIS — Z23 Encounter for immunization: Secondary | ICD-10-CM

## 2023-04-20 DIAGNOSIS — Z124 Encounter for screening for malignant neoplasm of cervix: Secondary | ICD-10-CM | POA: Insufficient documentation

## 2023-04-20 DIAGNOSIS — F32A Depression, unspecified: Secondary | ICD-10-CM | POA: Diagnosis not present

## 2023-04-20 DIAGNOSIS — Z1159 Encounter for screening for other viral diseases: Secondary | ICD-10-CM

## 2023-04-20 DIAGNOSIS — E785 Hyperlipidemia, unspecified: Secondary | ICD-10-CM

## 2023-04-20 DIAGNOSIS — N760 Acute vaginitis: Secondary | ICD-10-CM | POA: Diagnosis not present

## 2023-04-20 NOTE — Progress Notes (Signed)
[facility]  WELL-WOMAN PHYSICAL & PAP Patient name: Lori Morse MRN [id number]  Date of birth: 11-17-1965 Chief Complaint:   Hyperlipidemia and Depression  History of Present Illness:   Lori Morse is a Hispanic 57 y.o. [place] [phone]) No obstetric history on file. female being seen today for a routine well-woman exam.   CC:gyn   The current method of family planning is none.  Patient's last menstrual period was 09/02/2018 (exact date).  Health Maintenance  Topic Date Due   Hepatitis C Screening  Never done   Zoster (Shingles) Vaccine (1 of 2) Never done   COVID-19 Vaccine (3 - 2024-25 season) 01/01/2023   Pap with HPV screening  04/16/2023   Mammogram  01/07/2024   Cologuard (Stool DNA test)  12/30/2024   DTaP/Tdap/Td vaccine (3 - Td or Tdap) 11/25/2031   Flu Shot  Completed   HIV Screening  Completed   HPV Vaccine  Aged Out   Review of Systems:    Denies any headaches, blurred vision, fatigue, shortness of breath, chest pain, abdominal pain, abnormal vaginal discharge/itching/odor/irritation, problems with periods, bowel movements, urination, or intercourse unless otherwise stated above.  Pertinent History Reviewed:   Reviewed past medical,surgical, social and family history.  Reviewed problem list, medications and allergies.  Physical Assessment:   Vitals:   04/20/23 1537  BP: 121/78  Pulse: 62  Resp: 16  SpO2: 97%  Weight: 193 lb 9.6 oz (87.8 kg)  Body mass index is 35.41 kg/m.        Physical Examination:  General appearance - well appearing, and in no distress Mental status - alert, oriented to person, place, and time Psych:  She has a normal mood and affect Skin - warm and dry, normal color, no suspicious lesions noted Chest - effort normal, all lung fields clear to auscultation bilaterally Heart - normal rate and regular rhythm Neck:  midline trachea, no thyromegaly or nodules Breasts - breasts appear normal, no  suspicious masses, no skin or nipple changes or axillary nodes Educated patient on proper self breast examination and had patient to demonstrate SBE. Abdomen - soft, nontender, nondistended, no masses or organomegaly Pelvic-VULVA: normal appearing vulva with no masses, tenderness or lesions   VAGINA: normal appearing vagina with normal color and discharge, no lesions   CERVIX: normal appearing cervix without discharge or lesions, no CMT UTERUS: uterus is felt to be normal size, shape, consistency and nontender  ADNEXA: No adnexal masses or tenderness noted. Extremities:  No swelling or varicosities noted  No results found for this or any previous visit (from the past 24 hours).   Assessment & Plan:  Shale was seen today for hyperlipidemia and depression.  Diagnoses and all orders for this visit:  Cervical cancer screening -     Cytology - PAP  Screening examination for STD (sexually transmitted disease) -     Cervicovaginal ancillary only  Hyperlipidemia, unspecified hyperlipidemia type -     Lipid panel  Prediabetes -     CBC with Differential/Platelet -     CMP14+EGFR -     Hemoglobin A1c  Depression, unspecified depression type Flowsheet Row Office Visit from 04/20/2023 in [facility]  PHQ-9 Total Score 9        Need for hepatitis C screening test -     HCV Ab w Reflex to Quant PCR  Encounter for immunization -     Flu vaccine trivalent PF, 6mos and older(Flulaval,Afluria,Fluarix,Fluzone)    Orders  Placed This Encounter  Procedures   Flu vaccine trivalent PF, 6mos and older(Flulaval,Afluria,Fluarix,Fluzone)   CBC with Differential/Platelet   CMP14+EGFR   Lipid panel   Hemoglobin A1c   HCV Ab w Reflex to Quant PCR    Follow-up: Return in about 1 month (around 05/21/2023).  This note has been created with Education officer, environmental. Any transcriptional errors are unintentional.   Grayce Sessions, NP 04/20/2023, 4:24 PM

## 2023-04-20 NOTE — Patient Instructions (Signed)

## 2023-04-21 LAB — CBC WITH DIFFERENTIAL/PLATELET
Basophils Absolute: 0.1 10*3/uL (ref 0.0–0.2)
Basos: 1 %
EOS (ABSOLUTE): 0.2 10*3/uL (ref 0.0–0.4)
Eos: 3 %
Hematocrit: 42.8 % (ref 34.0–46.6)
Hemoglobin: 14.2 g/dL (ref 11.1–15.9)
Immature Grans (Abs): 0 10*3/uL (ref 0.0–0.1)
Immature Granulocytes: 0 %
Lymphocytes Absolute: 2.4 10*3/uL (ref 0.7–3.1)
Lymphs: 42 %
MCH: 31.1 pg (ref 26.6–33.0)
MCHC: 33.2 g/dL (ref 31.5–35.7)
MCV: 94 fL (ref 79–97)
Monocytes Absolute: 0.5 10*3/uL (ref 0.1–0.9)
Monocytes: 9 %
Neutrophils Absolute: 2.6 10*3/uL (ref 1.4–7.0)
Neutrophils: 45 %
Platelets: 243 10*3/uL (ref 150–450)
RBC: 4.57 x10E6/uL (ref 3.77–5.28)
RDW: 12.5 % (ref 11.7–15.4)
WBC: 5.8 10*3/uL (ref 3.4–10.8)

## 2023-04-21 LAB — CMP14+EGFR
ALT: 21 [IU]/L (ref 0–32)
AST: 23 [IU]/L (ref 0–40)
Albumin: 4.6 g/dL (ref 3.8–4.9)
Alkaline Phosphatase: 114 [IU]/L (ref 44–121)
BUN/Creatinine Ratio: 24 — ABNORMAL HIGH (ref 9–23)
BUN: 20 mg/dL (ref 6–24)
Bilirubin Total: 0.2 mg/dL (ref 0.0–1.2)
CO2: 23 mmol/L (ref 20–29)
Calcium: 9.3 mg/dL (ref 8.7–10.2)
Chloride: 101 mmol/L (ref 96–106)
Creatinine, Ser: 0.82 mg/dL (ref 0.57–1.00)
Globulin, Total: 2.5 g/dL (ref 1.5–4.5)
Glucose: 90 mg/dL (ref 70–99)
Potassium: 4.5 mmol/L (ref 3.5–5.2)
Sodium: 140 mmol/L (ref 134–144)
Total Protein: 7.1 g/dL (ref 6.0–8.5)
eGFR: 84 mL/min/{1.73_m2} (ref >59)

## 2023-04-21 LAB — HEMOGLOBIN A1C
Est. average glucose Bld gHb Est-mCnc: 120 mg/dL
Hgb A1c MFr Bld: 5.8 % — ABNORMAL HIGH (ref 4.8–5.6)

## 2023-04-21 LAB — HCV INTERPRETATION

## 2023-04-21 LAB — LIPID PANEL
Chol/HDL Ratio: 4.4 {ratio} (ref 0.0–4.4)
Cholesterol, Total: 188 mg/dL (ref 100–199)
HDL: 43 mg/dL (ref >39)
LDL Chol Calc (NIH): 91 mg/dL (ref 0–99)
Triglycerides: 323 mg/dL — ABNORMAL HIGH (ref 0–149)
VLDL Cholesterol Cal: 54 mg/dL — ABNORMAL HIGH (ref 5–40)

## 2023-04-21 LAB — HCV AB W REFLEX TO QUANT PCR: HCV Ab: NONREACTIVE

## 2023-04-25 LAB — CERVICOVAGINAL ANCILLARY ONLY
Bacterial Vaginitis (gardnerella): POSITIVE — AB
Candida Glabrata: NEGATIVE
Candida Vaginitis: NEGATIVE
Chlamydia: NEGATIVE
Comment: NEGATIVE
Comment: NEGATIVE
Comment: NEGATIVE
Comment: NEGATIVE
Comment: NEGATIVE
Comment: NORMAL
Neisseria Gonorrhea: NEGATIVE
Trichomonas: NEGATIVE

## 2023-04-25 LAB — CYTOLOGY - PAP: Diagnosis: NEGATIVE

## 2023-05-02 ENCOUNTER — Ambulatory Visit (INDEPENDENT_AMBULATORY_CARE_PROVIDER_SITE_OTHER): Payer: No Typology Code available for payment source | Admitting: Orthopaedic Surgery

## 2023-05-02 VITALS — BP 126/76 | HR 69 | Ht 62.0 in | Wt 193.0 lb

## 2023-05-02 DIAGNOSIS — M25562 Pain in left knee: Secondary | ICD-10-CM

## 2023-05-02 DIAGNOSIS — G8929 Other chronic pain: Secondary | ICD-10-CM | POA: Diagnosis not present

## 2023-05-04 NOTE — Progress Notes (Signed)
 Office Visit Note   Patient: Lori Morse           Date of Birth: [birth year]           MRN: [id number] Visit Date: 05/02/2023              Requested by: Celestia Rosaline SQUIBB, NP [address] [place],  [place] [address] PCP: Celestia Rosaline SQUIBB, NP   Assessment & Plan: Visit Diagnoses:  1. Chronic pain of left knee     Plan: Persistent left knee symptoms despite previous intra-articular injection, anti-inflammatory several months activity modification.  She is having persistent pain and leg pain  He states her knee is catching.  Will order an MRI scan left knee office follow-up after scan for review.  Follow-Up Instructions: No follow-ups on file.   Orders:  Orders Placed This Encounter  Procedures   MR Knee Left w/o contrast   No orders of the defined types were placed in this encounter.     Procedures: No procedures performed   Clinical Data: No additional findings.   Subjective: Chief Complaint  Patient presents with   Lower Back - Pain   Left Knee - Pain    HPI 58 year old patient released in February after on-the-job injury 12/26/2021.  Patient states some of the back pain is now radiated into the shoulder and into the arm.  Patient has some difficulty with walking with some stiffness in her knee.  She works used to work at [employer] and has not worked since 01/06/2023.  She has had previous injection in her left knee.  Patient been staying with her daughter.  She has used Advil  Aleve  over-the-counter medication.  MRI scan had been ordered [employer]. 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  Patient does have private insurance and states knee is still giving her pain problems limping and she states it is bothering her enough she is not able to work with how her knee currently is doing.  Review of Systems all systems noncontributory HPI.   Objective: Vital Signs: BP 126/76   Pulse 69   Ht 5' 2 (1.575 m)   Wt 193 lb (87.5 kg)   LMP 09/02/2018  (Exact Date)   BMI 35.30 kg/m   Physical Exam Constitutional:      Appearance: She is well-developed.  HENT:     Head: Normocephalic.     Right Ear: External ear normal.     Left Ear: External ear normal. There is no impacted cerumen.  Eyes:     Pupils: Pupils are equal, round, and reactive to light.  Neck:     Thyroid: No thyromegaly.     Trachea: No tracheal deviation.  Cardiovascular:     Rate and Rhythm: Normal rate.  Pulmonary:     Effort: Pulmonary effort is normal.  Abdominal:     Palpations: Abdomen is soft.  Musculoskeletal:     Cervical back: No rigidity.  Skin:    General: Skin is warm and dry.  Neurological:     Mental Status: She is alert and oriented to person, place, and time.  Psychiatric:        Behavior: Behavior normal.     Ortho Exam crepitus with knee range of motion left collateral ligaments are stable trace knee effusion.  She is ambulatory with the limp left knee.  Changes since she sits for a while and then ambulates again with a slightly different limp.  Negative logroll the hips knee and ankle jerk  are intact.  Some crepitus with knee loading.  Medial and lateral joint line tenderness she does not fully extend marked.  Are PAR  Specialty Comments:  No specialty comments available.  Imaging: No results found.   PMFS History: Patient Active Problem List   Diagnosis Date Noted   Lumbar contusion 06/28/2022   Bilateral bunions 08/28/2018   Past Medical History:  Diagnosis Date   Bilateral bunions    Depression    Hyperlipidemia    Left sided sciatica 03/2017   resolved   Plantar fasciitis, right    PTSD (post-traumatic stress disorder)    Pulmonary nodules 04/17/2018   Bilateral, noted on CXR    No family history on file.  Past Surgical History:  Procedure Laterality Date   BUNIONECTOMY Left 05/16/2019   Procedure: MASSIE EDWARDS WITH METATARSAL OSTEOTOMY;  Surgeon: Janit Thresa HERO, DPM;  Location: [facility];   Service: Podiatry;  Laterality: Left;   BUNIONECTOMY Right 11/22/2019   Procedure: ADRIAN EDWARDS;  Surgeon: Silva Juliene SAUNDERS, DPM;  Location: [facility];  Service: Podiatry;  Laterality: Right;  IV SEDATION   HALLUX VALGUS AUSTIN Right 05/17/2018   Procedure: HALLUX VALGUS AUSTIN RIGHT FOOT;  Surgeon: Janit Thresa HERO, DPM;  Location: [facility];  Service: Podiatry;  Laterality: Right;   HAMMER TOE SURGERY Right 11/22/2019   Procedure: HAMMER TOE REPAIR SECOND, THIRD,  FOURTH AND FIFTH TOES RIGHT FOOT;  Surgeon: Silva Juliene SAUNDERS, DPM;  Location: [facility];  Service: Podiatry;  Laterality: Right;  IV SEDATION   PLANTAR FASCIA RELEASE Right 05/17/2018   Procedure: PLANTAR FASCIOTOMY RIGHT FOOT;  Surgeon: Janit Thresa HERO, DPM;  Location: [facility];  Service: Podiatry;  Laterality: Right;   Steroid Injections     foot   Social History   Occupational History   Not on file  Tobacco Use   Smoking status: Never   Smokeless tobacco: Never  Vaping Use   Vaping status: Never Used  Substance and Sexual Activity   Alcohol use: Never   Drug use: Never   Sexual activity: Yes    Birth control/protection: Post-menopausal

## 2023-05-09 ENCOUNTER — Other Ambulatory Visit (INDEPENDENT_AMBULATORY_CARE_PROVIDER_SITE_OTHER): Payer: Self-pay | Admitting: Primary Care

## 2023-05-09 ENCOUNTER — Other Ambulatory Visit: Payer: Self-pay

## 2023-05-09 MED ORDER — GEMFIBROZIL 600 MG PO TABS
600.0000 mg | ORAL_TABLET | Freq: Two times a day (BID) | ORAL | 1 refills | Status: DC
  Filled 2023-05-09: qty 60, 30d supply, fill #0
  Filled 2023-06-19: qty 60, 30d supply, fill #1
  Filled 2023-09-11: qty 60, 30d supply, fill #2
  Filled 2023-10-09: qty 60, 30d supply, fill #3
  Filled 2023-11-13: qty 60, 30d supply, fill #4
  Filled 2023-12-18: qty 60, 30d supply, fill #5

## 2023-05-10 ENCOUNTER — Other Ambulatory Visit: Payer: Self-pay

## 2023-05-10 ENCOUNTER — Other Ambulatory Visit (INDEPENDENT_AMBULATORY_CARE_PROVIDER_SITE_OTHER): Payer: Self-pay | Admitting: Primary Care

## 2023-05-10 DIAGNOSIS — F32A Depression, unspecified: Secondary | ICD-10-CM

## 2023-05-10 DIAGNOSIS — Z760 Encounter for issue of repeat prescription: Secondary | ICD-10-CM

## 2023-05-10 DIAGNOSIS — M722 Plantar fascial fibromatosis: Secondary | ICD-10-CM

## 2023-05-12 NOTE — Telephone Encounter (Signed)
 Pt last refilled Cymbalta 12/30/2022 with a 90 day quantity at [facility]. Last rx was sent in 03/01/2022. Will forward to provider. Please refill if appropriate

## 2023-05-17 ENCOUNTER — Other Ambulatory Visit: Payer: Self-pay

## 2023-05-17 MED ORDER — DULOXETINE HCL 30 MG PO CPEP
30.0000 mg | ORAL_CAPSULE | Freq: Every day | ORAL | 1 refills | Status: DC
  Filled 2023-05-17: qty 30, 30d supply, fill #0
  Filled 2023-06-19: qty 30, 30d supply, fill #1
  Filled 2023-09-11: qty 30, 30d supply, fill #2
  Filled 2023-10-09: qty 30, 30d supply, fill #3
  Filled 2023-11-13: qty 30, 30d supply, fill #4
  Filled 2023-12-18: qty 30, 30d supply, fill #5

## 2023-05-25 ENCOUNTER — Other Ambulatory Visit: Payer: Self-pay

## 2023-06-19 ENCOUNTER — Other Ambulatory Visit: Payer: Self-pay

## 2023-09-11 ENCOUNTER — Other Ambulatory Visit: Payer: Self-pay

## 2023-10-06 ENCOUNTER — Ambulatory Visit: Payer: Self-pay

## 2023-10-06 NOTE — Telephone Encounter (Signed)
 Routing to office

## 2023-10-06 NOTE — Telephone Encounter (Signed)
 FYI Only or Action Required?: Action required by provider  Patient was last seen in primary care on 04/20/2023 by Lori Siemens, NP. Called Nurse Triage reporting hand swelling and Hand numbness. Symptoms began 2 months ago. Interventions attempted: OTC medications: Advil , Rest, hydration, or home remedies, and Ice/heat application. Symptoms are: stable.  Triage Disposition: See PCP When Office is Open (Within 3 Days)  Patient/caregiver understands and will follow disposition?: Copied from CRM [id number]. Topic: Clinical - Red Word Triage >> Oct 06, 2023  9:28 AM Georgeann Kindred wrote: Red Word that prompted transfer to Nurse Triage: Patient called with complaints on numb right hand that has been happening for the last 2 months. Hands during the night are numb and during the morning is swollen. [facility] [phone] Reason for Disposition  [1] MILD swelling (puffiness) of both hands AND [2] not better after 3 days  Answer Assessment - Initial Assessment Questions 1. ONSET: "When did the swelling start?" (e.g., minutes, hours, days)     Swelling started 2.5 months along with numbness having started prior to that.  2. LOCATION: "What part of the hand is swollen?"  "Are both hands swollen or just one hand?"     Both hands are swollen. Mainly in three fingers for each hand 3. SEVERITY: "How bad is the swelling?" (e.g., localized; mild, moderate, severe)   - BALL OR LUMP: small ball or lump   - LOCALIZED: puffy or swollen area or patch of skin   - JOINT SWELLING: swelling of a joint   - MILD: puffiness or mild swelling of fingers or hand   - MODERATE: fingers and hand are swollen   - SEVERE: swelling of entire hand and up into forearm     Joint swelling 4. REDNESS: "Does the swelling look red or infected?"     No redness or signs of infection 5. PAIN: "Is the swelling painful to touch?" If Yes, ask: "How painful is it?"   (Scale 1-10; mild, moderate or severe)     7  out of 10 6. FEVER: "Do you have a fever?" If Yes, ask: "What is it, how was it measured, and when did it start?"      no 7. 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 MEDICAL HISTORY: "Do you have a history of heart failure, kidney disease, liver failure, or cancer?"     No 9. RECURRENT SYMPTOM: "Have you had hand swelling before?" If Yes, ask: "When was the last time?" "What happened that time?"     no 10. OTHER SYMPTOMS: "Do you have any other symptoms?" (e.g., blurred vision, difficulty breathing, headache)       no Numbness goes to her entire hand at night. Patient reports using OTC medications for pain. Patient reports trying to soak her hands in warm water with salt. Endorses using ice on her hands as well.  Patient is needing to schedule for a Monday due to her job.  Protocols used: Hand Swelling-A-AH

## 2023-10-09 ENCOUNTER — Other Ambulatory Visit: Payer: Self-pay

## 2023-11-13 ENCOUNTER — Other Ambulatory Visit: Payer: Self-pay

## 2023-12-01 ENCOUNTER — Other Ambulatory Visit (HOSPITAL_COMMUNITY): Payer: Self-pay

## 2023-12-01 ENCOUNTER — Other Ambulatory Visit: Payer: Self-pay

## 2023-12-18 ENCOUNTER — Other Ambulatory Visit: Payer: Self-pay

## 2023-12-27 ENCOUNTER — Ambulatory Visit: Payer: Self-pay

## 2023-12-27 NOTE — Telephone Encounter (Addendum)
 FYI Only or Action Required?: FYI only for provider. - requesting call back for earlier appt if comes available  Patient was last seen in primary care on 04/20/2023 by Celestia Rosaline SQUIBB, NP.  Called Nurse Triage reporting Hand Pain, hand swelling, hand numbness, and interrupted sleep.  Symptoms began several months ago.  Interventions attempted: Other: stretches, massages, exercises.  Symptoms are: gradually worsening.  Triage Disposition: See Physician Within 24 Hours  Patient/caregiver understands and will follow disposition?: Yes     Interpreter on line with pt  Copied from CRM #[id number]. Topic: Clinical - Red Word Triage >> Dec 27, 2023  3:34 PM Larissa S wrote: [facility] that prompted transfer to Nurse Triage: pain, swelling, and numbness in both hands x 2 months- worsening Reason for Disposition  Numbness (i.e., loss of sensation) in hand or fingers  (Exceptions: Just tingling; numbness present > 2 weeks.)  Answer Assessment - Initial Assessment Questions 1. ONSET: When did the pain start?     2 months ago, during daytime can use hands and do work but when sleeping get numbness, wake up with numbness and they hurt During the whole night when sleeping, really numb and hurting, wake up because of the pain, every day numbness whole night, wake up from the pain, stretch massage exercises for hands and wave arms around each morning to get them feeling better Disappears then get it while sleeping, then do exercises until sensation passes or disappears then have my regular day Also when laying down and talking on phone, after while start to feel that numbness again 2-3 months numbness on hands but worsening of pain and symptoms started one month ago 2. LOCATION: Where is the pain located?     Both hands 3. PAIN: How bad is the pain? (Scale 1-10; or mild, moderate, severe)     Wakes her up from sleep 4. WORK OR EXERCISE: Has there been any recent work or exercise that  involved this part (i.e., hand or wrist) of the body?     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 AGGRAVATING FACTORS: What makes the pain worse? (e.g., using computer)     Sleeping or using phone while laying down 7. OTHER SYMPTOMS: Do you have any other symptoms? (e.g., fever, neck pain, numbness or tingling, rash, swelling)     Intermittent numbness and swelling to both hands Just little bit puffy not too severe but cannot bend fingers when numb, when numb like fingers stiff and hard to bend it, not because of the swelling but the pain in joints of hands Interrupted sleep No fever, neck pain, or rash No blue/purple or cold hands No other symptoms   Advised pt be examined in next 24 hours, no availability with PCP or region in next few weeks, advised UC in next 24 hours, sending message to PCP for call back with further recommendations.  Protocols used: Hand Pain-A-AH

## 2024-01-09 ENCOUNTER — Telehealth (INDEPENDENT_AMBULATORY_CARE_PROVIDER_SITE_OTHER): Payer: Self-pay | Admitting: Primary Care

## 2024-01-09 NOTE — Telephone Encounter (Signed)
 Called pt to confirm appt. Pt will be present.

## 2024-01-10 ENCOUNTER — Encounter (INDEPENDENT_AMBULATORY_CARE_PROVIDER_SITE_OTHER): Payer: Self-pay | Admitting: Primary Care

## 2024-01-10 ENCOUNTER — Ambulatory Visit (INDEPENDENT_AMBULATORY_CARE_PROVIDER_SITE_OTHER): Payer: Self-pay | Admitting: Primary Care

## 2024-01-10 VITALS — BP 134/80 | HR 60 | Temp 97.8°F | Resp 14 | Ht 62.0 in | Wt 185.4 lb

## 2024-01-10 DIAGNOSIS — G5603 Carpal tunnel syndrome, bilateral upper limbs: Secondary | ICD-10-CM

## 2024-01-10 NOTE — Progress Notes (Signed)
 [facility]  [name], is a 58 y.o. female  RDW:[id number]  FMW:[id number]  DOB - 1965/09/25  Chief Complaint  Patient presents with   Numbness       Subjective:   Lori Morse is a 58 y.o. female Lori Morse ([id number])here today for a acute visit.  Patient was last seen April 20, 2023.  Today she presents with bilateral wrist pain and numbness.  Tested for carpal tunnel syndrome she was positive for Phalen's Test and negative for Tinel's Test.  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  Patient has No headache, No chest pain, No abdominal pain - No Nausea, No new weakness tingling or numbness, No Cough - shortness of breath HPI  No problems updated.  Comprehensive ROS Pertinent positive and negative noted in HPI   No Known Allergies  Past Medical History:  Diagnosis Date   Bilateral bunions    Depression    Hyperlipidemia    Left sided sciatica 03/2017   resolved   Plantar fasciitis, right    PTSD (post-traumatic stress disorder)    Pulmonary nodules 04/17/2018   Bilateral, noted on CXR    Current Outpatient Medications on File Prior to Visit  Medication Sig Dispense Refill   DULoxetine  (CYMBALTA ) 30 MG capsule Take 1 capsule (30 mg total) by mouth daily. 90 capsule 1   gemfibrozil  (LOPID ) 600 MG tablet Take 1 tablet (600 mg total) by mouth 2 (two) times daily before a meal. 180 tablet 1   cyclobenzaprine  (FLEXERIL ) 10 MG tablet Take 1 tablet (10 mg total) by mouth 2 (two) times daily as needed for muscle spasms. (Patient not taking: Reported on 01/10/2024) 90 tablet 0   ibuprofen  (ADVIL ) 600 MG tablet Take 1 tablet (600 mg total) by mouth every 6 (six) hours as needed. (Patient not taking: Reported on 01/10/2024) 90 tablet 0   No current facility-administered medications on file prior to visit.   Health Maintenance  Topic Date Due   Hepatitis B Vaccine (1 of 3 - 19+ 3-dose series) Never done   Pneumococcal Vaccine for age  over 62 (1 of 1 - PCV) Never done   Zoster (Shingles) Vaccine (1 of 2) Never done   Flu Shot  12/01/2023   COVID-19 Vaccine (3 - 2025-26 season) 01/01/2024   Breast Cancer Screening  01/07/2024   Cologuard (Stool DNA test)  12/30/2024   Pap with HPV screening  04/19/2026   DTaP/Tdap/Td vaccine (3 - Td or Tdap) 11/25/2031   Hepatitis C Screening  Completed   HIV Screening  Completed   HPV Vaccine  Aged Out   Meningitis B Vaccine  Aged Out    Objective:   Vitals:   01/10/24 0944  BP: 134/80  Pulse: 60  Resp: 14  Temp: 97.8 F (36.6 C)  TempSrc: Oral  SpO2: 99%  Weight: 185 lb 6.4 oz (84.1 kg)  Height: 5' 2 (1.575 m)    Physical Exam Vitals reviewed.  Constitutional:      Appearance: Normal appearance. She is obese.  HENT:     Head: Normocephalic.     Right Ear: Tympanic membrane, ear canal and external ear normal.     Left Ear: Tympanic membrane, ear canal and external ear normal.     Nose: Nose normal.     Mouth/Throat:     Mouth: Mucous membranes are moist.  Eyes:     Extraocular Movements: Extraocular movements intact.     Pupils: Pupils are equal, round, and  reactive to light.  Cardiovascular:     Rate and Rhythm: Normal rate.  Pulmonary:     Effort: Pulmonary effort is normal.     Breath sounds: Normal breath sounds.  Abdominal:     General: Bowel sounds are normal.     Palpations: Abdomen is soft.  Musculoskeletal:        General: Normal range of motion.     Cervical back: Normal range of motion.  Skin:    General: Skin is warm and dry.  Neurological:     Mental Status: She is alert and oriented to person, place, and time.  Psychiatric:        Mood and Affect: Mood normal.        Behavior: Behavior normal.        Thought Content: Thought content normal.       Assessment & Plan  Verdean was seen today for numbness.  Diagnoses and all orders for this visit:  Bilateral carpal tunnel syndrome -     AMB referral to orthopedics     Patient have  been counseled extensively about nutrition and exercise. Other issues discussed during this visit include: low cholesterol diet, weight control and daily exercise, foot care, annual eye examinations at Ophthalmology, importance of adherence with medications and regular follow-up. We also discussed long term complications of uncontrolled diabetes and hypertension.   No follow-ups on file.  The patient was given clear instructions to go to ER or return to medical center if symptoms don't improve, worsen or new problems develop. The patient verbalized understanding. The patient was told to call to get lab results if they haven't heard anything in the next week.   This note has been created with Education officer, environmental. Any transcriptional errors are unintentional.   Lori SHAUNNA Bohr, NP 01/15/2024, 11:08 PM

## 2024-01-17 ENCOUNTER — Ambulatory Visit (INDEPENDENT_AMBULATORY_CARE_PROVIDER_SITE_OTHER): Payer: Self-pay

## 2024-01-22 ENCOUNTER — Other Ambulatory Visit (INDEPENDENT_AMBULATORY_CARE_PROVIDER_SITE_OTHER): Payer: Self-pay | Admitting: Primary Care

## 2024-01-22 ENCOUNTER — Other Ambulatory Visit: Payer: Self-pay

## 2024-01-22 DIAGNOSIS — M722 Plantar fascial fibromatosis: Secondary | ICD-10-CM

## 2024-01-22 DIAGNOSIS — Z760 Encounter for issue of repeat prescription: Secondary | ICD-10-CM

## 2024-01-22 DIAGNOSIS — F32A Depression, unspecified: Secondary | ICD-10-CM

## 2024-01-22 MED ORDER — DULOXETINE HCL 30 MG PO CPEP
30.0000 mg | ORAL_CAPSULE | Freq: Every day | ORAL | 1 refills | Status: AC
  Filled 2024-01-22 – 2024-02-12 (×2): qty 90, 90d supply, fill #0

## 2024-01-22 MED ORDER — GEMFIBROZIL 600 MG PO TABS
600.0000 mg | ORAL_TABLET | Freq: Two times a day (BID) | ORAL | 1 refills | Status: AC
  Filled 2024-01-22 (×2): qty 180, 90d supply, fill #0
  Filled 2024-02-12: qty 60, 30d supply, fill #0

## 2024-01-29 ENCOUNTER — Other Ambulatory Visit: Payer: Self-pay

## 2024-01-31 ENCOUNTER — Other Ambulatory Visit: Payer: Self-pay

## 2024-02-12 ENCOUNTER — Other Ambulatory Visit: Payer: Self-pay

## 2024-03-04 ENCOUNTER — Encounter: Payer: Self-pay | Admitting: Radiology
